# Patient Record
Sex: Female | Born: 1975 | Race: White | Hispanic: No | Smoking: Former smoker
Health system: Southern US, Community
[De-identification: ages and names within clinical notes are randomized; demographics above are authoritative.]

## PROBLEM LIST (undated history)

## (undated) DIAGNOSIS — F419 Anxiety disorder, unspecified: Secondary | ICD-10-CM

## (undated) DIAGNOSIS — L219 Seborrheic dermatitis, unspecified: Secondary | ICD-10-CM

## (undated) DIAGNOSIS — R51 Headache: Secondary | ICD-10-CM

## (undated) DIAGNOSIS — E8881 Metabolic syndrome: Secondary | ICD-10-CM

## (undated) DIAGNOSIS — E669 Obesity, unspecified: Secondary | ICD-10-CM

## (undated) DIAGNOSIS — R519 Headache, unspecified: Secondary | ICD-10-CM

## (undated) DIAGNOSIS — G43909 Migraine, unspecified, not intractable, without status migrainosus: Secondary | ICD-10-CM

## (undated) DIAGNOSIS — G43011 Migraine without aura, intractable, with status migrainosus: Secondary | ICD-10-CM

## (undated) HISTORY — DX: Headache: R51

## (undated) HISTORY — DX: Metabolic syndrome: E88.81

## (undated) HISTORY — DX: Migraine, unspecified, not intractable, without status migrainosus: G43.909

## (undated) HISTORY — DX: Seborrheic dermatitis, unspecified: L21.9

## (undated) HISTORY — DX: Obesity, unspecified: E66.9

## (undated) HISTORY — DX: Metabolic syndrome: E88.810

## (undated) HISTORY — DX: Anxiety disorder, unspecified: F41.9

## (undated) HISTORY — PX: CHOLECYSTECTOMY: SHX55

## (undated) HISTORY — DX: Headache, unspecified: R51.9

## (undated) HISTORY — PX: TUMOR REMOVAL: SHX12

## (undated) HISTORY — DX: Migraine without aura, intractable, with status migrainosus: G43.011

---

## 2000-11-17 HISTORY — PX: TUBAL LIGATION: SHX77

## 2006-11-18 ENCOUNTER — Ambulatory Visit: Payer: Self-pay | Admitting: Family Medicine

## 2006-12-08 ENCOUNTER — Ambulatory Visit: Payer: Self-pay | Admitting: General Surgery

## 2008-01-25 ENCOUNTER — Ambulatory Visit: Payer: Self-pay | Admitting: General Surgery

## 2008-01-31 ENCOUNTER — Ambulatory Visit: Payer: Self-pay | Admitting: General Surgery

## 2008-11-16 ENCOUNTER — Ambulatory Visit: Payer: Self-pay | Admitting: Family Medicine

## 2014-04-03 ENCOUNTER — Ambulatory Visit (INDEPENDENT_AMBULATORY_CARE_PROVIDER_SITE_OTHER): Payer: BC Managed Care – HMO | Admitting: General Surgery

## 2014-04-03 ENCOUNTER — Encounter: Payer: Self-pay | Admitting: General Surgery

## 2014-04-03 VITALS — BP 124/76 | HR 74 | Resp 12 | Ht 64.0 in | Wt 246.0 lb

## 2014-04-03 DIAGNOSIS — R229 Localized swelling, mass and lump, unspecified: Secondary | ICD-10-CM

## 2014-04-03 NOTE — Progress Notes (Signed)
Patient ID: Sally Mueller, female   DOB: 06-24-1976, 38 y.o.   MRN: [id number]  Chief Complaint  Patient presents with  . Other    New Patient evaluation lipoma on left arm    HPI Sally Mueller is a 38 y.o. female who presents for an evaluation of a lipoma on her left arm. The patient states the area has been present for approximately 4-5 years. She states is has gotten larger gradually over time. She has pain in the area when it is bumped or touched. No other problems at this time.   HPI  Past Medical History  Diagnosis Date  . Migraine     Past Surgical History  Procedure Laterality Date  . Cholecystectomy  2008?  [name] Cesarean section  2000, 2002  . Tumor removal  2012?    History reviewed. No pertinent family history.  Social History History  Substance Use Topics  . Smoking status: Former Smoker -- 8 years  . Smokeless tobacco: Never Used  . Alcohol Use: Yes    No Known Allergies  Current Outpatient Prescriptions  Medication Sig Dispense Refill  . ibuprofen (ADVIL,MOTRIN) 800 MG tablet Take 1 tablet by mouth as needed.       No current facility-administered medications for this visit.    Review of Systems Review of Systems  Constitutional: Negative.   Respiratory: Negative.   Cardiovascular: Negative.     Blood pressure 124/76, pulse 74, resp. rate 12, height 5\' 4"  (1.626 m), weight 246 lb (111.585 kg), last menstrual period 03/27/2014.  Physical Exam Physical Exam  Constitutional: She is oriented to person, place, and time. She appears well-developed and well-nourished.  Neurological: She is alert and oriented to person, place, and time.  Skin: Skin is warm and dry.  Firm nodule on left forearm medial aspect midpoint. 2.5 x 2 cm slightly firm mildly tender subcutaneous mass. Mobile.    Data Reviewed    Assessment    Lipoma left forearm, symptomatic     Plan    Excision in office. Disussed fully with pt anf she is agreeable.        Seeplaputhur G  Sankar 04/03/2014, 2:41 PM

## 2014-04-03 NOTE — Patient Instructions (Signed)
Patient to return to have excision for left forearm mass removed. The patient is aware to call back for any questions or concerns.

## 2014-05-01 ENCOUNTER — Other Ambulatory Visit: Payer: Self-pay | Admitting: General Surgery

## 2014-05-01 ENCOUNTER — Encounter: Payer: Self-pay | Admitting: General Surgery

## 2014-05-01 ENCOUNTER — Ambulatory Visit (INDEPENDENT_AMBULATORY_CARE_PROVIDER_SITE_OTHER): Payer: BC Managed Care – HMO | Admitting: General Surgery

## 2014-05-01 VITALS — BP 116/74 | HR 76 | Resp 12 | Ht 64.0 in | Wt 247.0 lb

## 2014-05-01 DIAGNOSIS — D2360 Other benign neoplasm of skin of unspecified upper limb, including shoulder: Secondary | ICD-10-CM

## 2014-05-01 DIAGNOSIS — R2230 Localized swelling, mass and lump, unspecified upper limb: Secondary | ICD-10-CM

## 2014-05-01 HISTORY — PX: LIPOMA EXCISION: SHX5283

## 2014-05-01 NOTE — Progress Notes (Signed)
Patient ID: Sally Mueller, female   DOB: [birth year], 38 y.o.   MRN: [id number]  Chief Complaint  Patient presents with  . Procedure    left arm lipoma    HPI Sally Mueller is a 38 y.o. female here today for an excision left forearm  lipoma. HPI  Past Medical History  Diagnosis Date  . Migraine     Past Surgical History  Procedure Laterality Date  . Cholecystectomy  2008?  Sally Mueller Cesarean section  2000, 2002  . Tumor removal  2012?    History reviewed. No pertinent family history.  Social History History  Substance Use Topics  . Smoking status: Former Smoker -- 8 years  . Smokeless tobacco: Never Used  . Alcohol Use: Yes    No Known Allergies  Current Outpatient Prescriptions  Medication Sig Dispense Refill  . ibuprofen (ADVIL,MOTRIN) 800 MG tablet Take 1 tablet by mouth as needed.      Sally Mueller ketorolac (TORADOL) 10 MG tablet Take 10 mg by mouth every 6 (six) hours as needed.       No current facility-administered medications for this visit.    Review of Systems Review of Systems  Constitutional: Negative.   Respiratory: Negative.   Cardiovascular: Negative.     Blood pressure 116/74, pulse 76, resp. rate 12, height 5\' 4"  (1.626 m), weight 247 lb (112.038 kg), last menstrual period 03/27/2014.  Physical Exam Physical Exam  Data Reviewed    Assessment    Lipoma left forearm     Plan    Excision dons as scheduled.      Procedure; Excision subcutaneous mass left forearm.  Anesthetic: 8 ml 1% xylocaine mixed with 0.5% marcaine.  Area prepped with Chloro Prep and sraped. Vertical 2cm incision made. 2.5 cm lipomatous mass excised out. Bleeding controlled with disposable cautery. Deeper tissue closed with 3-0 vicryl. Skin closed with 4-0 vicryl subcuticular stitch. Steristrips, telfa and Tegaderm applied. No immediate problems with procedure.  [facility] yo use Tylenol, motrin or Aleve as needed for pain.    Naw Sally Mueller 05/01/2014, 9:00 PM

## 2014-05-01 NOTE — Patient Instructions (Addendum)
Patient to return as needed. The patient is aware to call back for any questions or concerns. Wound care instructions given.

## 2014-05-03 LAB — PATHOLOGY

## 2014-05-09 ENCOUNTER — Telehealth: Payer: Self-pay | Admitting: *Deleted

## 2014-05-09 NOTE — Telephone Encounter (Signed)
Notified patient as instructed, patient pleased. Discussed follow-up appointments on an as needed basis, patient agrees

## 2014-05-09 NOTE — Telephone Encounter (Signed)
Message copied by Carson Myrtle on Tue May 09, 2014  8:10 AM ------      Message from: Christene Lye      Created: Fri May 05, 2014 12:08 PM       Please let pt pt know the pathology was normal. ------

## 2014-09-18 ENCOUNTER — Encounter: Payer: Self-pay | Admitting: General Surgery

## 2015-05-12 ENCOUNTER — Other Ambulatory Visit: Payer: Self-pay | Admitting: Family Medicine

## 2015-05-14 NOTE — Telephone Encounter (Signed)
She must be seen, printed one month

## 2015-05-17 ENCOUNTER — Ambulatory Visit (INDEPENDENT_AMBULATORY_CARE_PROVIDER_SITE_OTHER): Payer: 59 | Admitting: Family Medicine

## 2015-05-17 ENCOUNTER — Encounter: Payer: Self-pay | Admitting: Family Medicine

## 2015-05-17 VITALS — BP 128/74 | HR 74 | Temp 97.7°F | Resp 18 | Ht 64.0 in | Wt 246.6 lb

## 2015-05-17 DIAGNOSIS — R519 Headache, unspecified: Secondary | ICD-10-CM | POA: Insufficient documentation

## 2015-05-17 DIAGNOSIS — E8881 Metabolic syndrome: Secondary | ICD-10-CM | POA: Insufficient documentation

## 2015-05-17 DIAGNOSIS — N946 Dysmenorrhea, unspecified: Secondary | ICD-10-CM | POA: Insufficient documentation

## 2015-05-17 DIAGNOSIS — G43009 Migraine without aura, not intractable, without status migrainosus: Secondary | ICD-10-CM

## 2015-05-17 DIAGNOSIS — F411 Generalized anxiety disorder: Secondary | ICD-10-CM | POA: Diagnosis not present

## 2015-05-17 DIAGNOSIS — G43109 Migraine with aura, not intractable, without status migrainosus: Secondary | ICD-10-CM | POA: Insufficient documentation

## 2015-05-17 DIAGNOSIS — R51 Headache: Secondary | ICD-10-CM

## 2015-05-17 DIAGNOSIS — D1720 Benign lipomatous neoplasm of skin and subcutaneous tissue of unspecified limb: Secondary | ICD-10-CM | POA: Insufficient documentation

## 2015-05-17 MED ORDER — ALPRAZOLAM ER 0.5 MG PO TB24: 0.5000 mg | ORAL_TABLET | Freq: Every day | ORAL | Status: DC

## 2015-05-17 NOTE — Progress Notes (Signed)
Name: Sally Mueller   MRN: [id number]    DOB: 1976-02-03   Date:05/17/2015       Progress Note  Subjective  Chief Complaint  Chief Complaint  Patient presents with  . Migraine    Started yesterday at 6 a.m. started in left eye, worsen around menstrual cycle and anxiety, radiating to left back of head, sharp and stabbing pain, throbbing on right side of eye. Patient has got a daith piercing on left side and helps intensity  . Anxiety    feels like medication is not as potency as it was.    HPI  Migraine headaches: she has noticed decrease in frequency of migraine since March 2016, but she had a severe episode yesterday and was feeling nauseated until she got here today. She denies aura, pain is always behind the left eye, described as a sharp/piercing pain, associated with nausea, phonophobia, photophobia, dizziness. She has been holding off on taking Axert , takes Tramadol first and promethazine and waiting to take Axert if gets worse.   Generalized Anxiety Disorder: taking Summer Classes at [employer] and getting licence in education. She has been having to take alprazolam twice at times.   Patient Active Problem List   Diagnosis Date Noted  . Anxiety, generalized 05/17/2015  . Tension headache 05/17/2015  . Dysmenorrhea 05/17/2015  . Dysmetabolic syndrome 84/69/6295  . Migraine without status migrainosus, not intractable 05/17/2015  . Extreme obesity 05/17/2015    Past Surgical History  Procedure Laterality Date  . Cholecystectomy  2008?  [name] Cesarean section  2000, 2002  . Tumor removal  2012?  [name] Tubal ligation  2002  . Lipoma excision  05/01/2014    Family History  Problem Relation Age of Onset  . Myasthenia gravis Sister   . Hyperlipidemia Father   . Cardiomyopathy Mother   . Hypertension Mother     History   Social History  . Marital Status: Single    Spouse Name: N/A  . Number of Children: 3  . Years of Education: N/A   Occupational History  .  Not on file.   Social History Main Topics  . Smoking status: Former Smoker -- 8 years    Types: Cigarettes    Quit date: 11/17/2001  . Smokeless tobacco: Never Used  . Alcohol Use: 0.0 oz/week    0 Standard drinks or equivalent per week     Comment: occasionally-socially  . Drug Use: No  . Sexual Activity:    Partners: Male   Other Topics Concern  . Not on file   Social History Narrative     Current outpatient prescriptions:  .  almotriptan (AXERT) 12.5 MG tablet, Take 1 tablet by mouth as needed., Disp: , Rfl:  .  ALPRAZolam (XANAX) 0.5 MG tablet, TAKE 1 TABLET BY MOUTH AS NEEDED FOR ANXIETY ATTACK OR SLEEP.(MAX OF ONE TABLET A DAY), Disp: 30 tablet, Rfl: 0 .  ibuprofen (ADVIL,MOTRIN) 800 MG tablet, Take 1 tablet by mouth as needed., Disp: , Rfl:  .  promethazine (PHENERGAN) 12.5 MG tablet, Take 1 tablet by mouth every 4 (four) hours as needed., Disp: , Rfl:  .  traMADol (ULTRAM) 50 MG tablet, TAKE 1 TABLET BY MOUTH AT ONSET OF MIGRAINE AS NEEDED., Disp: 30 tablet, Rfl: 0 .  ALPRAZolam (ALPRAZOLAM XR) 0.5 MG 24 hr tablet, Take 1 tablet (0.5 mg total) by mouth daily., Disp: 30 tablet, Rfl: 0 .  ketorolac (TORADOL) 10 MG tablet, Take 10 mg by mouth every  6 (six) hours as needed., Disp: , Rfl:   Allergies  Allergen Reactions  . Sumatriptan     crawling sensation in her body, flushed     ROS  Constitutional: Negative for fever or weight change.  Respiratory: Negative for cough and shortness of breath.   Cardiovascular: Negative for chest pain or palpitations.  Gastrointestinal: Negative for abdominal pain, no bowel changes.  Musculoskeletal: Negative for gait problem or joint swelling.  Skin: Negative for rash.  Neurological: positive  for dizziness or headache.  No other specific complaints in a complete review of systems (except as listed in HPI above).   Objective  Filed Vitals:   05/17/15 1439  BP: 128/74  Pulse: 74  Temp: 97.7 F (36.5 C)  TempSrc: Oral   Resp: 18  Height: 5\' 4"  (1.626 m)  Weight: 246 lb 9.6 oz (111.857 kg)  SpO2: 96%    Body mass index is 42.31 kg/(m^2).  Physical Exam   Constitutional: Patient appears well-developed and well-nourished. No distress.  Eyes:  No scleral icterus. PERL Neck: Normal range of motion. Neck supple. Cardiovascular: Normal rate, regular rhythm and normal heart sounds.  No murmur heard. No BLE edema. Pulmonary/Chest: Effort normal and breath sounds normal. No respiratory distress. Abdominal: Soft.  There is no tenderness. Psychiatric: Patient has a normal mood and affect. behavior is normal. Judgment and thought content normal. Neuro: Romberg negative, normal coordination  PHQ2/9: Depression screen PHQ 2/9 05/17/2015  Decreased Interest 0  Down, Depressed, Hopeless 0  PHQ - 2 Score 0     Fall Risk: Fall Risk  05/17/2015  Falls in the past year? No    Assessment & Plan  1. Migraine without aura and without status migrainosus, not intractable Explained importance of taking Axert right away and other medications prn  2. Anxiety, generalized  - ALPRAZolam (ALPRAZOLAM XR) 0.5 MG 24 hr tablet; Take 1 tablet (0.5 mg total) by mouth daily.  Dispense: 30 tablet; Refill: 0

## 2015-05-17 NOTE — Patient Instructions (Signed)

## 2015-06-21 ENCOUNTER — Other Ambulatory Visit: Payer: Self-pay

## 2015-06-21 ENCOUNTER — Telehealth: Payer: Self-pay | Admitting: Family Medicine

## 2015-06-21 NOTE — Telephone Encounter (Signed)
Requesting refill on tramadol. Please send to [email or web address]

## 2015-06-22 MED ORDER — TRAMADOL HCL 50 MG PO TABS
50.0000 mg | ORAL_TABLET | Freq: Four times a day (QID) | ORAL | Status: DC
Start: 2015-06-22 — End: 2015-10-18

## 2015-06-26 ENCOUNTER — Other Ambulatory Visit: Payer: Self-pay

## 2015-06-26 ENCOUNTER — Telehealth: Payer: Self-pay | Admitting: Family Medicine

## 2015-06-26 NOTE — Telephone Encounter (Signed)
Pt states she needs refill on Tramodol. She is going out of town tomorrow and has an appt on 07/12/15.

## 2015-06-26 NOTE — Telephone Encounter (Signed)
Already called into [facility]

## 2015-06-28 ENCOUNTER — Ambulatory Visit: Payer: 59 | Admitting: Family Medicine

## 2015-07-12 ENCOUNTER — Encounter: Payer: Self-pay | Admitting: Family Medicine

## 2015-07-12 ENCOUNTER — Ambulatory Visit (INDEPENDENT_AMBULATORY_CARE_PROVIDER_SITE_OTHER): Payer: 59 | Admitting: Family Medicine

## 2015-07-12 VITALS — BP 126/78 | HR 89 | Temp 98.4°F | Resp 18 | Ht 64.0 in | Wt 244.0 lb

## 2015-07-12 DIAGNOSIS — Z79899 Other long term (current) drug therapy: Secondary | ICD-10-CM

## 2015-07-12 DIAGNOSIS — Z13220 Encounter for screening for lipoid disorders: Secondary | ICD-10-CM

## 2015-07-12 DIAGNOSIS — E8881 Metabolic syndrome: Secondary | ICD-10-CM

## 2015-07-12 DIAGNOSIS — G44209 Tension-type headache, unspecified, not intractable: Secondary | ICD-10-CM | POA: Diagnosis not present

## 2015-07-12 DIAGNOSIS — G43009 Migraine without aura, not intractable, without status migrainosus: Secondary | ICD-10-CM | POA: Diagnosis not present

## 2015-07-12 DIAGNOSIS — F411 Generalized anxiety disorder: Secondary | ICD-10-CM | POA: Diagnosis not present

## 2015-07-12 DIAGNOSIS — K589 Irritable bowel syndrome without diarrhea: Secondary | ICD-10-CM | POA: Insufficient documentation

## 2015-07-12 MED ORDER — HYOSCYAMINE SULFATE 0.125 MG PO TABS: 0.1250 mg | ORAL_TABLET | ORAL | Status: DC | PRN

## 2015-07-12 MED ORDER — RIZATRIPTAN BENZOATE 10 MG PO TABS: 10.0000 mg | ORAL_TABLET | ORAL | Status: DC | PRN

## 2015-07-12 MED ORDER — ALPRAZOLAM 0.5 MG PO TABS: 0.5000 mg | ORAL_TABLET | Freq: Every day | ORAL | Status: DC | PRN

## 2015-07-12 MED ORDER — NORTRIPTYLINE HCL 10 MG PO CAPS: 10.0000 mg | ORAL_CAPSULE | Freq: Every day | ORAL | Status: DC

## 2015-07-12 MED ORDER — ALPRAZOLAM ER 0.5 MG PO TB24: 0.5000 mg | ORAL_TABLET | Freq: Every day | ORAL | Status: DC

## 2015-07-12 NOTE — Progress Notes (Signed)
Name: Sally Mueller   MRN: [id number]    DOB: 29-May-1976   Date:07/12/2015       Progress Note  Subjective  Chief Complaint  Chief Complaint  Patient presents with  . Medication Refill    1 month F/U  . Anxiety    Unchanged  . Migraine    Patient has not had a migraine since a month ago, and takes Tramadol for Migraine preventation    HPI  IBS: for the past 5 years she has noticed, bloating, abdominal pain, episodes of constipation and diarrhea, multiple trips to the bathroom in a day that sometimes affects her job. Pain improves after having a bowel movement.   Migraine: she does not have aura, episodes are not as frequent now, but she takes Tramadol to prevent a migraine , takes for tension type a few times weekly. Insurance no longer covers Axert and needs to try Maxalt  Anxiety: she is doing well on medication, Xanax SR is working well for her, taking prn medication very seldom for panic attack only   Patient Active Problem List   Diagnosis Date Noted  . IBS (irritable bowel syndrome) 07/12/2015  . Anxiety, generalized 05/17/2015  . Tension headache 05/17/2015  . Dysmenorrhea 05/17/2015  . Dysmetabolic syndrome 71/24/5809  . Migraine without status migrainosus, not intractable 05/17/2015  . Extreme obesity 05/17/2015    Past Surgical History  Procedure Laterality Date  . Cholecystectomy  2008?  [name] Cesarean section  2000, 2002  . Tumor removal  2012?  [name] Tubal ligation  2002  . Lipoma excision  05/01/2014    Family History  Problem Relation Age of Onset  . Myasthenia gravis Sister   . Hyperlipidemia Father   . Cardiomyopathy Mother   . Hypertension Mother     Social History   Social History  . Marital Status: Single    Spouse Name: N/A  . Number of Children: 3  . Years of Education: N/A   Occupational History  . Not on file.   Social History Main Topics  . Smoking status: Former Smoker -- 8 years    Types: Cigarettes    Quit date: 11/17/2001  .  Smokeless tobacco: Never Used  . Alcohol Use: 0.0 oz/week    0 Standard drinks or equivalent per week     Comment: occasionally-socially  . Drug Use: No  . Sexual Activity:    Partners: Male   Other Topics Concern  . Not on file   Social History Narrative     Current outpatient prescriptions:  .  ALPRAZolam (ALPRAZOLAM XR) 0.5 MG 24 hr tablet, Take 1 tablet (0.5 mg total) by mouth daily., Disp: 30 tablet, Rfl: 2 .  ALPRAZolam (XANAX) 0.5 MG tablet, Take 1 tablet (0.5 mg total) by mouth daily as needed for anxiety., Disp: 30 tablet, Rfl: 0 .  ibuprofen (ADVIL,MOTRIN) 800 MG tablet, Take 1 tablet by mouth as needed., Disp: , Rfl:  .  promethazine (PHENERGAN) 12.5 MG tablet, Take 1 tablet by mouth every 4 (four) hours as needed., Disp: , Rfl:  .  traMADol (ULTRAM) 50 MG tablet, Take 1 tablet (50 mg total) by mouth 4 (four) times daily., Disp: 30 tablet, Rfl: 0 .  hyoscyamine (LEVSIN) 0.125 MG tablet, Take 1 tablet (0.125 mg total) by mouth every 4 (four) hours as needed., Disp: 90 tablet, Rfl: 2 .  nortriptyline (PAMELOR) 10 MG capsule, Take 1 capsule (10 mg total) by mouth at bedtime., Disp: 30 capsule, Rfl: 2 .  rizatriptan (MAXALT) 10 MG tablet, Take 1 tablet (10 mg total) by mouth as needed for migraine. May repeat in 2 hours if needed, Disp: 4 tablet, Rfl: 2  Allergies  Allergen Reactions  . Sumatriptan     crawling sensation in her body, flushed     ROS  Constitutional: Negative for fever or weight change.  Respiratory: Negative for cough and shortness of breath.   Cardiovascular: Negative for chest pain or palpitations.  Gastrointestinal: Positive  for abdominal pain, no bowel changes - chronic problems Musculoskeletal: Negative for gait problem or joint swelling.  Skin: Negative for rash.  Neurological: Negative for dizziness , positive for  headache.  No other specific complaints in a complete review of systems (except as listed in HPI above).  Objective  Filed  Vitals:   07/12/15 1111  BP: 126/78  Pulse: 89  Temp: 98.4 F (36.9 C)  TempSrc: Oral  Resp: 18  Height: 5\' 4"  (1.626 m)  Weight: 244 lb (110.678 kg)  SpO2: 97%    Body mass index is 41.86 kg/(m^2).  Physical Exam  Constitutional: Patient appears well-developed and well-nourished. Obese  No distress.  HEENT: head atraumatic, normocephalic, pupils equal and reactive to light, neck supple, throat within normal limits Cardiovascular: Normal rate, regular rhythm and normal heart sounds.  No murmur heard. No BLE edema. Pulmonary/Chest: Effort normal and breath sounds normal. No respiratory distress. Abdominal: Soft.  Mild  tenderness. Psychiatric: Patient has a normal mood and affect. behavior is normal. Judgment and thought content normal. Neurological: normal   PHQ2/9: Depression screen PHQ 2/9 05/17/2015  Decreased Interest 0  Down, Depressed, Hopeless 0  PHQ - 2 Score 0    Fall Risk: Fall Risk  05/17/2015  Falls in the past year? No     Assessment & Plan   1. Migraine without aura and without status migrainosus, not intractable  - rizatriptan (MAXALT) 10 MG tablet; Take 1 tablet (10 mg total) by mouth as needed for migraine. May repeat in 2 hours if needed  Dispense: 4 tablet; Refill: 2  2. Dysmetabolic syndrome  - Hemoglobin A1c  3. Anxiety, generalized  - ALPRAZolam (ALPRAZOLAM XR) 0.5 MG 24 hr tablet; Take 1 tablet (0.5 mg total) by mouth daily.  Dispense: 30 tablet; Refill: 2 - ALPRAZolam (XANAX) 0.5 MG tablet; Take 1 tablet (0.5 mg total) by mouth daily as needed for anxiety.  Dispense: 30 tablet; Refill: 0  4. Tension headache Continue Tramadol prn   5. IBS (irritable bowel syndrome)  - nortriptyline (PAMELOR) 10 MG capsule; Take 1 capsule (10 mg total) by mouth at bedtime.  Dispense: 30 capsule; Refill: 2 - hyoscyamine (LEVSIN) 0.125 MG tablet; Take 1 tablet (0.125 mg total) by mouth every 4 (four) hours as needed.  Dispense: 90 tablet; Refill: 2  6.  Lipid screening  - Lipid panel  7. Long-term use of high-risk medication  - Comprehensive metabolic panel

## 2015-07-12 NOTE — Patient Instructions (Signed)
Irritable Bowel Syndrome Irritable bowel syndrome (IBS) is caused by a disturbance of normal bowel function and is a common digestive disorder. You may also hear this condition called spastic colon, mucous colitis, and irritable colon. There is no cure for IBS. However, symptoms often gradually improve or disappear with a good diet, stress management, and medicine. This condition usually appears in late adolescence or early adulthood. Women develop it twice as often as men. CAUSES  After food has been digested and absorbed in the small intestine, waste material is moved into the large intestine, or colon. In the colon, water and salts are absorbed from the undigested products coming from the small intestine. The remaining residue, or fecal material, is held for elimination. Under normal circumstances, gentle, rhythmic contractions of the bowel walls push the fecal material along the colon toward the rectum. In IBS, however, these contractions are irregular and poorly coordinated. The fecal material is either retained too long, resulting in constipation, or expelled too soon, producing diarrhea. SIGNS AND SYMPTOMS  The most common symptom of IBS is abdominal pain. It is often in the lower left side of the abdomen, but it may occur anywhere in the abdomen. The pain comes from spasms of the bowel muscles happening too much and from the buildup of gas and fecal material in the colon. This pain:  Can range from sharp abdominal cramps to a dull, continuous ache.  Often worsens soon after eating.  Is often relieved by having a bowel movement or passing gas. Abdominal pain is usually accompanied by constipation, but it may also produce diarrhea. The diarrhea often occurs right after a meal or upon waking up in the morning. The stools are often soft, watery, and flecked with mucus. Other symptoms of IBS include:  Bloating.  Loss of appetite.  Heartburn.  Backache.  Dull pain in the arms or  shoulders.  Nausea.  Burping.  Vomiting.  Gas. IBS may also cause symptoms that are unrelated to the digestive system, such as:  Fatigue.  Headaches.  Anxiety.  Shortness of breath.  Trouble concentrating.  Dizziness. These symptoms tend to come and go. DIAGNOSIS  The symptoms of IBS may seem like symptoms of other, more serious digestive disorders. Your health care Sally Mueller may want to perform tests to exclude these disorders.  TREATMENT Many medicines are available to help correct bowel function or relieve bowel spasms and abdominal pain. Among the medicines available are:  Laxatives for severe constipation and to help restore normal bowel habits.  Specific antidiarrheal medicines to treat severe or lasting diarrhea.  Antispasmodic agents to relieve intestinal cramps. Your health care Sally Mueller may also decide to treat you with a mild tranquilizer or sedative during unusually stressful periods in your life. Your health care Sally Mueller may also prescribe antidepressant medicine. The use of this medicine has been shown to reduce pain and other symptoms of IBS. Remember that if any medicine is prescribed for you, you should take it exactly as directed. Make sure your health care Sally Mueller knows how well it worked for you. HOME CARE INSTRUCTIONS   Take all medicines as directed by your health care Sally Mueller.  Avoid foods that are high in fat or oils, such as heavy cream, butter, frankfurters, sausage, and other fatty meats.  Avoid foods that make you go to the bathroom, such as fruit, fruit juice, and dairy products.  Cut out carbonated drinks, chewing gum, and "gassy" foods such as beans and cabbage. This may help relieve bloating and burping.    Eat foods with bran, and drink plenty of liquids with the bran foods. This helps relieve constipation.  Keep track of what foods seem to bring on your symptoms.  Avoid emotionally charged situations or circumstances that produce  anxiety.  Start or continue exercising.  Get plenty of rest and sleep. Document Released: 11/03/2005 Document Revised: 11/08/2013 Document Reviewed: 06/23/2008 [facility] Patient Information 2015 [place], [place]. This information is not intended to replace advice given to you by your health care Sally Mueller. Make sure you discuss any questions you have with your health care Sally Mueller. Diet and Irritable Bowel Syndrome  No cure has been found for irritable bowel syndrome (IBS). Many options are available to treat the symptoms. Your caregiver will give you the best treatments available for your symptoms. He or she will also encourage you to manage stress and to make changes to your diet. You need to work with your caregiver and Registered Dietician to find the best combination of medicine, diet, counseling, and support to control your symptoms. The following are some diet suggestions. FOODS THAT MAKE IBS WORSE  Fatty foods, such as [place] fries.  Milk products, such as cheese or ice cream.  Chocolate.  Alcohol.  Caffeine (found in coffee and some sodas).  Carbonated drinks, such as soda. If certain foods cause symptoms, you should eat less of them or stop eating them. FOOD JOURNAL   Keep a journal of the foods that seem to cause distress. Write down:  What you are eating during the day and when.  What problems you are having after eating.  When the symptoms occur in relation to your meals.  What foods always make you feel badly.  Take your notes with you to your caregiver to see if you should stop eating certain foods. FOODS THAT MAKE IBS BETTER Fiber reduces IBS symptoms, especially constipation, because it makes stools soft, bulky, and easier to pass. Fiber is found in bran, bread, cereal, beans, fruit, and vegetables. Examples of foods with fiber include:  Apples.  Peaches.  Pears.  Berries.  Figs.  Broccoli, raw.  Cabbage.  Carrots.  Raw peas.  Kidney  beans.  Lima beans.  Whole-grain bread.  Whole-grain cereal. Add foods with fiber to your diet a little at a time. This will let your body get used to them. Too much fiber at once might cause gas and swelling of your abdomen. This can trigger symptoms in a person with IBS. Caregivers usually recommend a diet with enough fiber to produce soft, painless bowel movements. High fiber diets may cause gas and bloating. However, these symptoms often go away within a few weeks, as your body adjusts. In many cases, dietary fiber may lessen IBS symptoms, particularly constipation. However, it may not help pain or diarrhea. High fiber diets keep the colon mildly enlarged (distended) with the added fiber. This may help prevent spasms in the colon. Some forms of fiber also keep water in the stool, thereby preventing hard stools that are difficult to pass.  Besides telling you to eat more foods with fiber, your caregiver may also tell you to get more fiber by taking a fiber pill or drinking water mixed with a special high fiber powder. An example of this is a natural fiber laxative containing psyllium seed.  TIPS  Large meals can cause cramping and diarrhea in people with IBS. If this happens to you, try eating 4 or 5 small meals a day, or try eating less at each of your usual 3 meals.  It may also help if your meals are low in fat and high in carbohydrates. Examples of carbohydrates are pasta, rice, whole-grain breads and cereals, fruits, and vegetables.  If dairy products cause your symptoms to flare up, you can try eating less of those foods. You might be able to handle yogurt better than other dairy products, because it contains bacteria that helps with digestion. Dairy products are an important source of calcium and other nutrients. If you need to avoid dairy products, be sure to talk with a Registered Dietitian about getting these nutrients through other food sources.  Drink enough water and fluids to keep  your urine clear or pale yellow. This is important, especially if you have diarrhea. FOR MORE INFORMATION  International Foundation for Functional Gastrointestinal Disorders: www.iffgd.org  National Digestive Diseases Information Clearinghouse: digestive.AmenCredit.is Document Released: 01/24/2004 Document Revised: 01/26/2012 Document Reviewed: 02/03/2014 [facility] Patient Information 2015 [place], [place]. This information is not intended to replace advice given to you by your health care Sally Mueller. Make sure you discuss any questions you have with your health care Sally Mueller.

## 2015-07-13 ENCOUNTER — Ambulatory Visit: Payer: 59 | Admitting: Family Medicine

## 2015-08-30 ENCOUNTER — Ambulatory Visit
Admission: RE | Admit: 2015-08-30 | Discharge: 2015-08-30 | Disposition: A | Discharge status: Home / Self Care | Payer: 59 | Source: Ambulatory Visit | Attending: Family Medicine | Admitting: Family Medicine

## 2015-08-30 ENCOUNTER — Ambulatory Visit (INDEPENDENT_AMBULATORY_CARE_PROVIDER_SITE_OTHER): Payer: 59 | Admitting: Family Medicine

## 2015-08-30 ENCOUNTER — Encounter: Payer: Self-pay | Admitting: Family Medicine

## 2015-08-30 DIAGNOSIS — M542 Cervicalgia: Secondary | ICD-10-CM | POA: Diagnosis not present

## 2015-08-30 DIAGNOSIS — M62838 Other muscle spasm: Secondary | ICD-10-CM | POA: Insufficient documentation

## 2015-08-30 DIAGNOSIS — G43019 Migraine without aura, intractable, without status migrainosus: Secondary | ICD-10-CM | POA: Diagnosis not present

## 2015-08-30 IMAGING — CR DG CERVICAL SPINE COMPLETE 4+V
5 series · 5 of 5 positions shown · non-contrast
Comparison: None.

CLINICAL DATA: Posterior neck pain for several days, no injury

EXAM:
CERVICAL SPINE  4+ VIEWS

[c-spine lat]
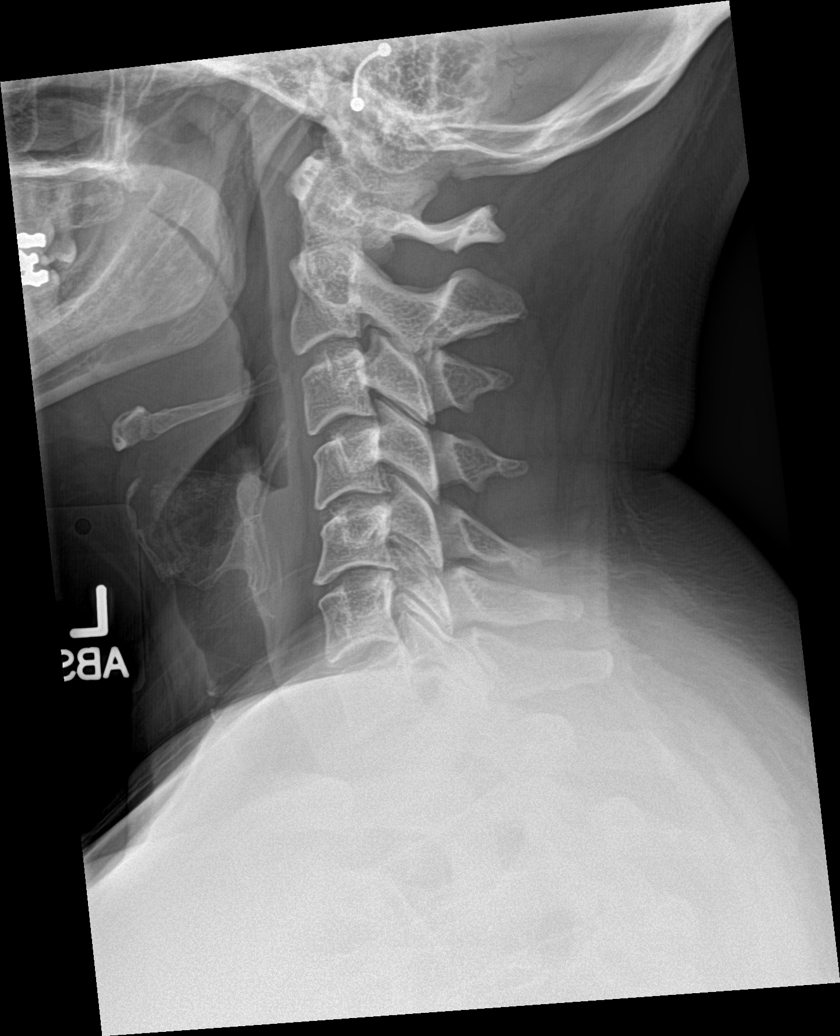

[c-spine obl (1 of 2)]
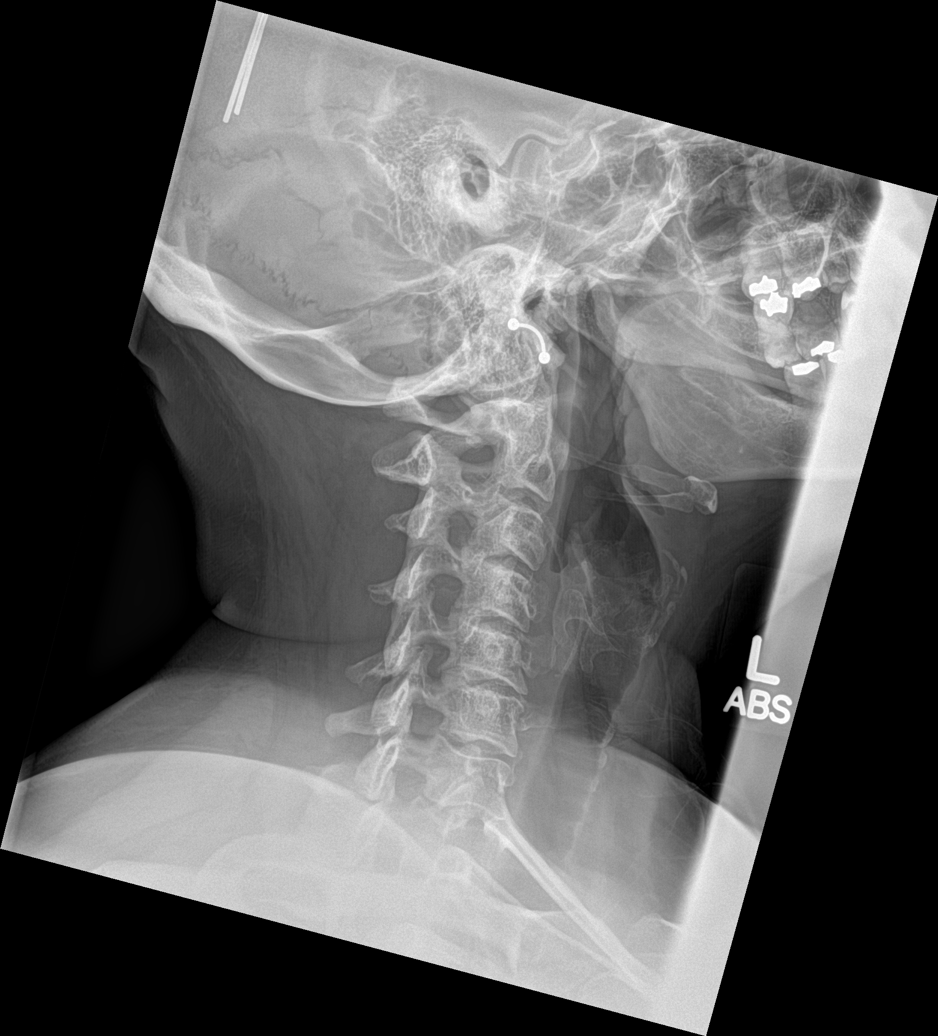

[c-spine obl (2 of 2)]
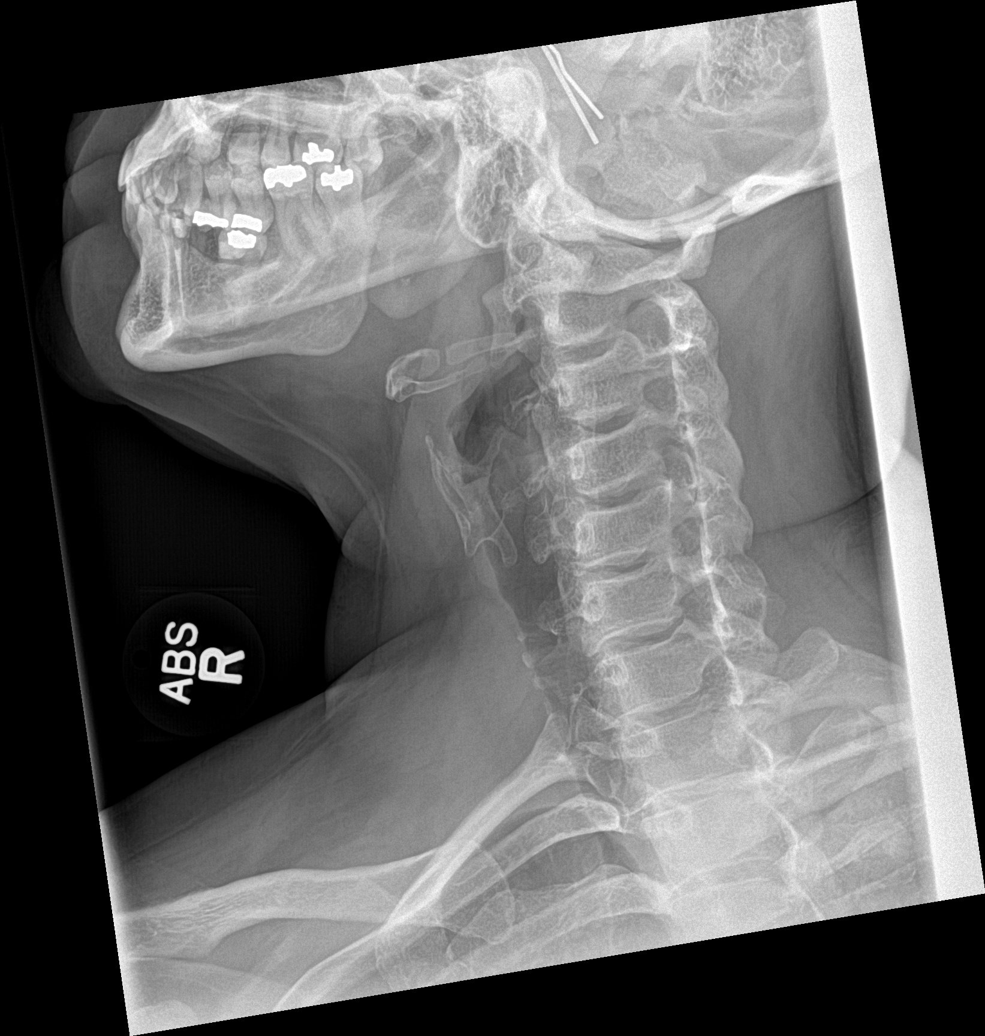

[c-spine ap]
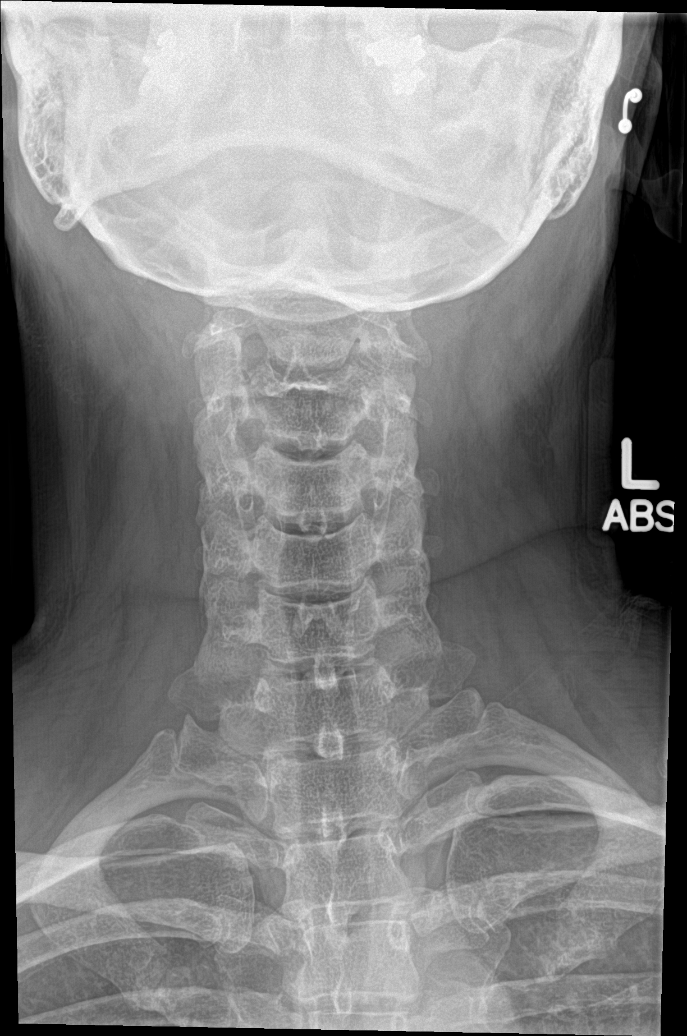

[c-spine open mouth]
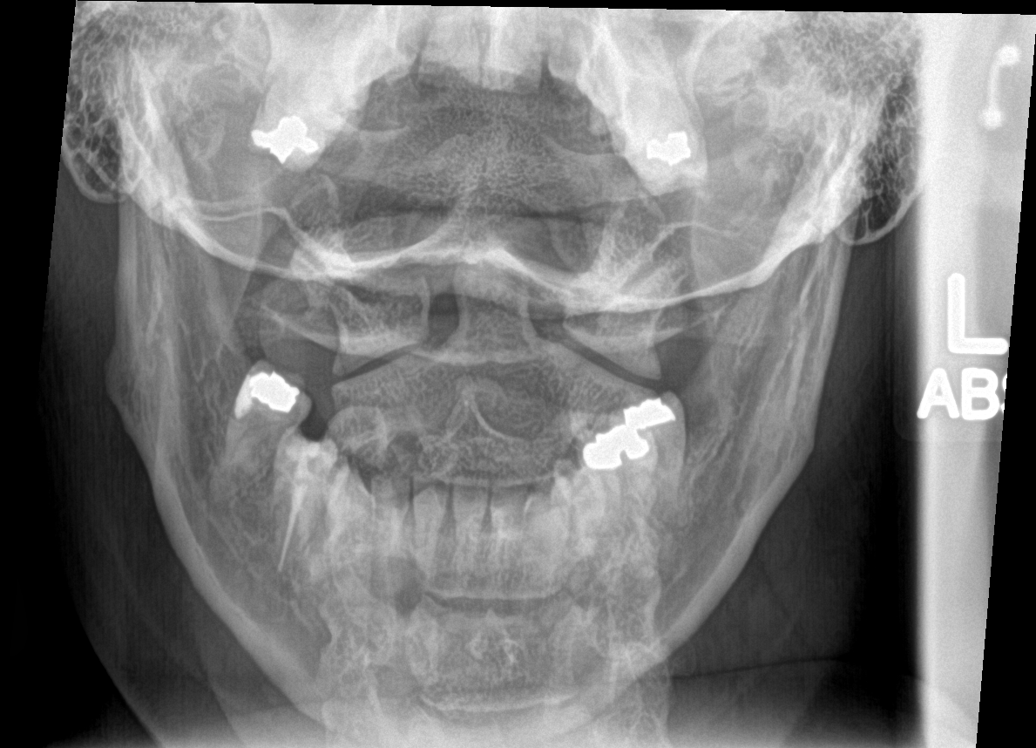

[5 of 5 positions shown; findings below may reference images not displayed]

FINDINGS: The cervical vertebrae are straightened in alignment. No significant
loss of intervertebral disc space is seen. There may be minimally
diminished disc space at C5-6. No prevertebral soft tissue swelling
is seen. No fracture is noted. On oblique views, there is mild
foraminal narrowing bilaterally at C5-6 and to a lesser degree at
C6-7. The odontoid process is intact. The lung apices are clear.
IMPRESSION: 1. Straightened alignment.
2. Possible mild degenerative disc disease at C5-6 with some
foraminal narrowing bilaterally at this level.

## 2015-08-30 MED ORDER — TIZANIDINE HCL 2 MG PO TABS: 2.0000 mg | ORAL_TABLET | Freq: Three times a day (TID) | ORAL | Status: DC | PRN

## 2015-08-30 MED ORDER — PROMETHAZINE HCL 25 MG PO TABS: 25.0000 mg | ORAL_TABLET | Freq: Three times a day (TID) | ORAL | Status: DC | PRN

## 2015-08-30 MED ORDER — KETOROLAC TROMETHAMINE 60 MG/2ML IM SOLN
60.0000 mg | Freq: Once | INTRAMUSCULAR | Status: AC
  Administered 2015-08-30: 60 mg via INTRAMUSCULAR

## 2015-08-30 MED ORDER — KETOROLAC TROMETHAMINE 60 MG/2ML IM SOLN: 60.0000 mg | Freq: Once | INTRAMUSCULAR | Status: DC

## 2015-08-30 NOTE — Progress Notes (Signed)
Name: Sally Mueller   MRN: [id number]    DOB: 1975-12-04   Date:08/30/2015       Progress Note  Subjective  Chief Complaint  Chief Complaint  Patient presents with  . Acute Visit    Migraine x3 days (Dr. Ancil Boozer Pt)    Migraine  This is a recurrent problem. The current episode started 1 to 4 weeks ago (4 days ago). The problem occurs constantly. The pain is located in the occipital and left unilateral region. The pain radiates to the right neck. The quality of the pain is described as throbbing and shooting. The pain is at a severity of 8/10. Associated symptoms include dizziness, a loss of balance, nausea and neck pain. Pertinent negatives include no fever, tingling or vomiting. She has tried triptans and oral narcotics for the symptoms.   Past Medical History  Diagnosis Date  . Migraine   . Obesity   . Headache   . Migraine without aura, with intractable migraine, so stated, with status migrainosus   . Metabolic syndrome   . Seborrhea   . Anxiety     Past Surgical History  Procedure Laterality Date  . Cholecystectomy  2008?  [name] Cesarean section  2000, 2002  . Tumor removal  2012?  [name] Tubal ligation  2002  . Lipoma excision  05/01/2014    Family History  Problem Relation Age of Onset  . Myasthenia gravis Sister   . Hyperlipidemia Father   . Cardiomyopathy Mother   . Hypertension Mother    Social History   Social History  . Marital Status: Single    Spouse Name: N/A  . Number of Children: 3  . Years of Education: N/A   Occupational History  . Not on file.   Social History Main Topics  . Smoking status: Former Smoker -- 8 years    Types: Cigarettes    Quit date: 11/17/2001  . Smokeless tobacco: Never Used  . Alcohol Use: 0.0 oz/week    0 Standard drinks or equivalent per week     Comment: occasionally-socially  . Drug Use: No  . Sexual Activity:    Partners: Male   Other Topics Concern  . Not on file   Social History Narrative    Current outpatient  prescriptions:  .  almotriptan (AXERT) 12.5 MG tablet, TK 1 T PO  AT ONSET OF MIGRAINE, Disp: , Rfl: 2 .  ALPRAZolam (ALPRAZOLAM XR) 0.5 MG 24 hr tablet, Take 1 tablet (0.5 mg total) by mouth daily., Disp: 30 tablet, Rfl: 2 .  ALPRAZolam (XANAX) 0.5 MG tablet, Take 1 tablet (0.5 mg total) by mouth daily as needed for anxiety., Disp: 30 tablet, Rfl: 0 .  hyoscyamine (LEVSIN) 0.125 MG tablet, Take 1 tablet (0.125 mg total) by mouth every 4 (four) hours as needed., Disp: 90 tablet, Rfl: 2 .  ibuprofen (ADVIL,MOTRIN) 800 MG tablet, Take 1 tablet by mouth as needed., Disp: , Rfl:  .  nortriptyline (PAMELOR) 10 MG capsule, Take 1 capsule (10 mg total) by mouth at bedtime., Disp: 30 capsule, Rfl: 2 .  promethazine (PHENERGAN) 12.5 MG tablet, Take 1 tablet by mouth every 4 (four) hours as needed., Disp: , Rfl:  .  rizatriptan (MAXALT) 10 MG tablet, Take 1 tablet (10 mg total) by mouth as needed for migraine. May repeat in 2 hours if needed, Disp: 4 tablet, Rfl: 2 .  traMADol (ULTRAM) 50 MG tablet, Take 1 tablet (50 mg total) by mouth 4 (four) times daily., Disp:  30 tablet, Rfl: 0  Allergies  Allergen Reactions  . Sumatriptan     crawling sensation in her body, flushed   Review of Systems  Constitutional: Positive for malaise/fatigue. Negative for fever and chills.  Gastrointestinal: Positive for nausea. Negative for vomiting.  Musculoskeletal: Positive for neck pain.  Neurological: Positive for dizziness, headaches and loss of balance. Negative for tingling.    Objective  Filed Vitals:   08/30/15 1429  BP: 128/76  Pulse: 98  Temp: 98.9 F (37.2 C)  TempSrc: Oral  Resp: 18  Height: 5\' 4"  (1.626 m)  Weight: 240 lb 1.6 oz (108.909 kg)  SpO2: 98%    Physical Exam  Constitutional: She is oriented to person, place, and time. Distressed: Patient appears in obvious pain. No other distress.  Cardiovascular: Normal rate and regular rhythm.   Pulmonary/Chest: Effort normal and breath sounds  normal.  Musculoskeletal: Normal range of motion.       Cervical back: She exhibits tenderness, pain and spasm.       Back:  Muscle spasm and tenderness on palpation over the right cervical spine area  Neurological: She is alert and oriented to person, place, and time.  Skin: Skin is warm and dry.  Nursing note and vitals reviewed.   Assessment & Plan  1. Migraine without aura, intractable Symptoms unresponsive to 2 different triptans. We will administer Toradol injection and prescription for promethazine for relief of nausea. - promethazine (PHENERGAN) 25 MG tablet; Take 1 tablet (25 mg total) by mouth every 8 (eight) hours as needed.  Dispense: 30 tablet; Refill: 0 - ketorolac (TORADOL) injection 60 mg; Inject 2 mLs (60 mg total) into the muscle once.  2. Posterior neck pain Suspect cervical spine muscle spasm. Obtain x-ray and started on muscle relaxant therapy. - DG Cervical Spine Complete; Future - tiZANidine (ZANAFLEX) 2 MG tablet; Take 1 tablet (2 mg total) by mouth every 8 (eight) hours as needed for muscle spasms.  Dispense: 30 tablet; Refill: 0   Sally Mueller [facility] 08/30/2015 2:46 PM

## 2015-10-18 ENCOUNTER — Encounter: Payer: Self-pay | Admitting: Family Medicine

## 2015-10-18 ENCOUNTER — Ambulatory Visit (INDEPENDENT_AMBULATORY_CARE_PROVIDER_SITE_OTHER): Payer: 59 | Admitting: Family Medicine

## 2015-10-18 VITALS — BP 122/74 | HR 91 | Temp 98.5°F | Resp 16 | Ht 64.0 in | Wt 236.1 lb

## 2015-10-18 DIAGNOSIS — K589 Irritable bowel syndrome without diarrhea: Secondary | ICD-10-CM | POA: Diagnosis not present

## 2015-10-18 DIAGNOSIS — F411 Generalized anxiety disorder: Secondary | ICD-10-CM

## 2015-10-18 DIAGNOSIS — G43009 Migraine without aura, not intractable, without status migrainosus: Secondary | ICD-10-CM | POA: Diagnosis not present

## 2015-10-18 MED ORDER — TRAMADOL HCL 50 MG PO TABS: 50.0000 mg | ORAL_TABLET | Freq: Four times a day (QID) | ORAL | Status: DC

## 2015-10-18 MED ORDER — ALPRAZOLAM ER 0.5 MG PO TB24: 0.5000 mg | ORAL_TABLET | Freq: Every day | ORAL | Status: DC

## 2015-10-18 MED ORDER — NORTRIPTYLINE HCL 10 MG PO CAPS: 10.0000 mg | ORAL_CAPSULE | Freq: Every day | ORAL | Status: DC

## 2015-10-18 MED ORDER — ALPRAZOLAM 0.5 MG PO TABS: 0.5000 mg | ORAL_TABLET | Freq: Every day | ORAL | Status: DC | PRN

## 2015-10-18 NOTE — Progress Notes (Signed)
Name: Sally Mueller   MRN: [id number]    DOB: 01/04/1976   Date:10/18/2015       Progress Note  Subjective  Chief Complaint  Chief Complaint  Patient presents with  . Follow-up    three month  . Migraine    has decreased, axert med helps.  . Irritable Bowel Syndrome    has changed her diet and improving    HPI  Migraine Headache: she is doing better, on Nortriptyline. Maxalt did not control her symptoms, she only responds to Axert, Headache does not have aura but is described as sharp/piercing behind left eye, severe, associated with nausea, phonophobia and photophobia and dizziness.  GAD: she states most of her stress recently is secondary to her son Rolla Plate that has ADHD and ODD ), she is taking Alprazolam XR and prn alprazolam. Also going to school.   IBS: she changed her diet since last visit and Nortriptyline is helping with abdominal pain/cramping. She has intermittent episodes of constipation and diarrhea.  Obesity: doing well with life style modification, lost 8 lbs since last visit. Eating less fried food and dairy. No bloating.    Patient Active Problem List   Diagnosis Date Noted  . Posterior neck pain 08/30/2015  . IBS (irritable bowel syndrome) 07/12/2015  . Anxiety, generalized 05/17/2015  . Tension headache 05/17/2015  . Dysmenorrhea 05/17/2015  . Dysmetabolic syndrome 99991111  . Migraine without aura, intractable 05/17/2015  . Extreme obesity ([place]) 05/17/2015    Past Surgical History  Procedure Laterality Date  . Cholecystectomy  2008?  [name] Cesarean section  2000, 2002  . Tumor removal  2012?  [name] Tubal ligation  2002  . Lipoma excision  05/01/2014    Family History  Problem Relation Age of Onset  . Myasthenia gravis Sister   . Hyperlipidemia Father   . Cardiomyopathy Mother   . Hypertension Mother     Social History   Social History  . Marital Status: Single    Spouse Name: N/A  . Number of Children: 3  . Years of Education: N/A    Occupational History  . Not on file.   Social History Main Topics  . Smoking status: Former Smoker -- 8 years    Types: Cigarettes    Quit date: 11/17/2001  . Smokeless tobacco: Never Used  . Alcohol Use: 0.0 oz/week    0 Standard drinks or equivalent per week     Comment: occasionally-socially  . Drug Use: No  . Sexual Activity:    Partners: Male   Other Topics Concern  . Not on file   Social History Narrative     Current outpatient prescriptions:  .  almotriptan (AXERT) 12.5 MG tablet, TK 1 T PO  AT ONSET OF MIGRAINE, Disp: , Rfl: 2 .  ALPRAZolam (ALPRAZOLAM XR) 0.5 MG 24 hr tablet, Take 1 tablet (0.5 mg total) by mouth daily., Disp: 30 tablet, Rfl: 2 .  ALPRAZolam (XANAX) 0.5 MG tablet, Take 1 tablet (0.5 mg total) by mouth daily as needed for anxiety., Disp: 30 tablet, Rfl: 0 .  ibuprofen (ADVIL,MOTRIN) 800 MG tablet, Take 1 tablet by mouth as needed., Disp: , Rfl:  .  nortriptyline (PAMELOR) 10 MG capsule, Take 1 capsule (10 mg total) by mouth at bedtime., Disp: 30 capsule, Rfl: 2 .  promethazine (PHENERGAN) 25 MG tablet, Take 1 tablet (25 mg total) by mouth every 8 (eight) hours as needed., Disp: 30 tablet, Rfl: 0 .  traMADol (ULTRAM) 50 MG tablet,  Take 1 tablet (50 mg total) by mouth 4 (four) times daily., Disp: 30 tablet, Rfl: 0  Allergies  Allergen Reactions  . Sumatriptan     crawling sensation in her body, flushed     ROS  Constitutional: Negative for fever , positive for weight change - loss.  Respiratory: Negative for cough and shortness of breath.   Cardiovascular: Negative for chest pain or palpitations.  Gastrointestinal: Negative for abdominal pain, no bowel changes.  Musculoskeletal: Negative for gait problem or joint swelling.  Skin: Negative for rash.  Neurological: Negative for dizziness but has intermittent  headache.  No other specific complaints in a complete review of systems (except as listed in HPI above).  Objective  Filed Vitals:    10/18/15 1510  BP: 122/74  Pulse: 91  Temp: 98.5 F (36.9 C)  TempSrc: Oral  Resp: 16  Height: 5\' 4"  (1.626 m)  Weight: 236 lb 1.6 oz (107.094 kg)  SpO2: 98%    Body mass index is 40.51 kg/(m^2).  Physical Exam  Constitutional: Patient appears well-developed and well-nourished. Obese No distress.  HEENT: head atraumatic, normocephalic, pupils equal and reactive to light,  neck supple, throat within normal limits Cardiovascular: Normal rate, regular rhythm and normal heart sounds.  No murmur heard. No BLE edema. Pulmonary/Chest: Effort normal and breath sounds normal. No respiratory distress. Abdominal: Soft.  There is no tenderness. Psychiatric: Patient has a normal mood and affect. behavior is normal. Judgment and thought content normal.   PHQ2/9: Depression screen [facility] 2/9 08/30/2015 05/17/2015  Decreased Interest 0 0  Down, Depressed, Hopeless 0 0  PHQ - 2 Score 0 0    Fall Risk: Fall Risk  08/30/2015 05/17/2015  Falls in the past year? No No     Assessment & Plan  1. Migraine without aura and without status migrainosus, not intractable  Continue prn medication  - traMADol (ULTRAM) 50 MG tablet; Take 1 tablet (50 mg total) by mouth 4 (four) times daily.  Dispense: 30 tablet; Refill: 0 - nortriptyline (PAMELOR) 10 MG capsule; Take 1 capsule (10 mg total) by mouth at bedtime.  Dispense: 30 capsule; Refill: 2  2. IBS (irritable bowel syndrome)  Doing well with change in diet and Nortripyline - nortriptyline (PAMELOR) 10 MG capsule; Take 1 capsule (10 mg total) by mouth at bedtime.  Dispense: 30 capsule; Refill: 2  3. Anxiety, generalized  Continue medication  - ALPRAZolam (ALPRAZOLAM XR) 0.5 MG 24 hr tablet; Take 1 tablet (0.5 mg total) by mouth daily.  Dispense: 30 tablet; Refill: 2 - ALPRAZolam (XANAX) 0.5 MG tablet; Take 1 tablet (0.5 mg total) by mouth daily as needed for anxiety.  Dispense: 30 tablet; Refill: 0  4. Morbid obesity, unspecified obesity type  [facility])  Discussed with the patient the risk posed by an increased BMI. Discussed importance of portion control, calorie counting and at least 150 minutes of physical activity weekly. Avoid sweet beverages and drink more water. Eat at least 6 servings of fruit and vegetables daily

## 2015-11-07 ENCOUNTER — Other Ambulatory Visit: Payer: Self-pay | Admitting: Family Medicine

## 2015-11-07 NOTE — Telephone Encounter (Signed)
Patient requesting refill. 

## 2015-12-04 ENCOUNTER — Telehealth: Payer: Self-pay | Admitting: Family Medicine

## 2015-12-04 NOTE — Telephone Encounter (Signed)
Requesting refill on Alprazolam fasting acting. Please send to [email or web address]. She is completely out. Patient thought she had one on file but the pharmacy is saying differently

## 2015-12-04 NOTE — Telephone Encounter (Signed)
Please disreguard the pharmacy called the patient back and told her that they did find the prescription, therefore they are going to fill it.

## 2015-12-31 ENCOUNTER — Other Ambulatory Visit: Payer: Self-pay | Admitting: Family Medicine

## 2015-12-31 DIAGNOSIS — G43009 Migraine without aura, not intractable, without status migrainosus: Secondary | ICD-10-CM

## 2015-12-31 MED ORDER — TRAMADOL HCL 50 MG PO TABS: 50.0000 mg | ORAL_TABLET | Freq: Four times a day (QID) | ORAL | Status: DC

## 2015-12-31 NOTE — Telephone Encounter (Signed)
Pt would like to know if she can get refill on Tramodol.

## 2016-01-17 ENCOUNTER — Ambulatory Visit: Payer: 59 | Admitting: Family Medicine

## 2016-02-12 ENCOUNTER — Ambulatory Visit (INDEPENDENT_AMBULATORY_CARE_PROVIDER_SITE_OTHER): Payer: 59 | Admitting: Family Medicine

## 2016-02-12 ENCOUNTER — Encounter: Payer: Self-pay | Admitting: Family Medicine

## 2016-02-12 VITALS — BP 118/72 | HR 82 | Temp 98.4°F | Resp 16 | Wt 234.8 lb

## 2016-02-12 DIAGNOSIS — F411 Generalized anxiety disorder: Secondary | ICD-10-CM

## 2016-02-12 DIAGNOSIS — G43009 Migraine without aura, not intractable, without status migrainosus: Secondary | ICD-10-CM

## 2016-02-12 DIAGNOSIS — K589 Irritable bowel syndrome without diarrhea: Secondary | ICD-10-CM | POA: Diagnosis not present

## 2016-02-12 DIAGNOSIS — E8881 Metabolic syndrome: Secondary | ICD-10-CM

## 2016-02-12 MED ORDER — NORTRIPTYLINE HCL 10 MG PO CAPS: 10.0000 mg | ORAL_CAPSULE | Freq: Every day | ORAL | Status: DC

## 2016-02-12 MED ORDER — TRAMADOL HCL 50 MG PO TABS: 50.0000 mg | ORAL_TABLET | Freq: Four times a day (QID) | ORAL | Status: DC

## 2016-02-12 MED ORDER — ALPRAZOLAM 0.5 MG PO TABS: 0.5000 mg | ORAL_TABLET | Freq: Every day | ORAL | Status: DC | PRN

## 2016-02-12 MED ORDER — ALPRAZOLAM ER 1 MG PO TB24: 1.0000 mg | ORAL_TABLET | Freq: Every day | ORAL | Status: DC

## 2016-02-12 NOTE — Progress Notes (Signed)
Name: Sally Mueller   MRN: [id number]    DOB: 03/29/1976   Date:02/12/2016       Progress Note  Subjective  Chief Complaint  Chief Complaint  Patient presents with  . Migraine  . Irritable Bowel Syndrome  . Anxiety  . Obesity  . Medication Refill    HPI  Migraine Headache: she is doing better, on Nortriptyline. Maxalt did not control her symptoms, she only responds to Axert, Headache does not have aura but is described as sharp/piercing behind left eye, severe, associated with nausea, phonophobia and photophobia and dizziness. Only one episode of migraine since December. She still has some tension headaches and takes Tramadol prn for that.  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 She also has a lot of stress from  her son Rolla Plate that has ADHD and ODD ), she is taking Alprazolam XR and prn alprazolam, but Xanax XR does not last all day. Also going to school and has a full time job at [employer], and part time at [employer]. Feeling overwhelmed. She is going to finish her degree this Summer and hopefully will feel better.  IBS: she changed her diet since last visit and Nortriptyline is helping with abdominal pain/cramping. She has intermittent episodes of constipation and diarrhea. She has eliminated dairy and fatty meals, also decrease amount of soda intake.   Obesity: doing well with life style modification, lost 10 lbs since September 2016t. Eating less fried food and dairy. No bloating.   Metabolic syndrome: she denies polyphagia, polydipsia or polyuria.    Patient Active Problem List   Diagnosis Date Noted  . Posterior neck pain 08/30/2015  . IBS (irritable bowel syndrome) 07/12/2015  . Anxiety, generalized 05/17/2015  . Tension headache 05/17/2015  . Dysmenorrhea 05/17/2015  . Dysmetabolic syndrome 99991111  . Migraine without aura, intractable 05/17/2015  . Extreme  obesity ([place]) 05/17/2015    Past Surgical History  Procedure Laterality Date  . Cholecystectomy  2008?  [name] Cesarean section  2000, 2002  . Tumor removal  2012?  [name] Tubal ligation  2002  . Lipoma excision  05/01/2014    Family History  Problem Relation Age of Onset  . Myasthenia gravis Sister   . Hyperlipidemia Father   . Cardiomyopathy Mother   . Hypertension Mother     Social History   Social History  . Marital Status: Single    Spouse Name: N/A  . Number of Children: 3  . Years of Education: N/A   Occupational History  . Not on file.   Social History Main Topics  . Smoking status: Former Smoker -- 8 years    Types: Cigarettes    Quit date: 11/17/2001  . Smokeless tobacco: Never Used  . Alcohol Use: 0.0 oz/week    0 Standard drinks or equivalent per week     Comment: occasionally-socially  . Drug Use: No  . Sexual Activity:    Partners: Male   Other Topics Concern  . Not on file   Social History Narrative     Current outpatient prescriptions:  .  almotriptan (AXERT) 12.5 MG tablet, TK 1 T PO  AT ONSET OF MIGRAINE, Disp: , Rfl: 2 .  ALPRAZolam (XANAX XR) 1 MG 24 hr tablet, Take 1 tablet (1 mg total) by mouth daily., Disp: 30 tablet, Rfl: 2 .  ALPRAZolam (XANAX) 0.5 MG tablet, Take 1  tablet (0.5 mg total) by mouth daily as needed for anxiety., Disp: 30 tablet, Rfl: 0 .  nortriptyline (PAMELOR) 10 MG capsule, Take 1 capsule (10 mg total) by mouth at bedtime., Disp: 30 capsule, Rfl: 2 .  promethazine (PHENERGAN) 25 MG tablet, Take 1 tablet (25 mg total) by mouth every 8 (eight) hours as needed., Disp: 30 tablet, Rfl: 0 .  traMADol (ULTRAM) 50 MG tablet, Take 1 tablet (50 mg total) by mouth 4 (four) times daily., Disp: 30 tablet, Rfl: 0  Allergies  Allergen Reactions  . Sumatriptan     crawling sensation in her body, flushed     ROS  Ten systems reviewed and is negative except as mentioned in HPI   Objective  Filed Vitals:   02/12/16 1443  BP: 118/72   Pulse: 82  Temp: 98.4 F (36.9 C)  TempSrc: Oral  Resp: 16  Weight: 234 lb 12.8 oz (106.505 kg)  SpO2: 94%    Body mass index is 40.28 kg/(m^2).  Physical Exam  Constitutional: Patient appears well-developed and well-nourished. Obese  No distress.  HEENT: head atraumatic, normocephalic, pupils equal and reactive to light neck supple, throat within normal limits Cardiovascular: Normal rate, regular rhythm and normal heart sounds.  No murmur heard. No BLE edema. Pulmonary/Chest: Effort normal and breath sounds normal. No respiratory distress. Abdominal: Soft.  There is no tenderness. Psychiatric: Patient has a normal mood and affect. behavior is normal. Judgment and thought content normal.  PHQ2/9: Depression screen [facility] 2/9 02/12/2016 08/30/2015 05/17/2015  Decreased Interest 0 0 0  Down, Depressed, Hopeless 0 0 0  PHQ - 2 Score 0 0 0    Fall Risk: Fall Risk  02/12/2016 08/30/2015 05/17/2015  Falls in the past year? No No No    Functional Status Survey: Is the patient deaf or have difficulty hearing?: No Does the patient have difficulty seeing, even when wearing glasses/contacts?: No Does the patient have difficulty concentrating, remembering, or making decisions?: No Does the patient have difficulty walking or climbing stairs?: No Does the patient have difficulty dressing or bathing?: No Does the patient have difficulty doing errands alone such as visiting a doctor's office or shopping?: No    Assessment & Plan  1. Migraine without aura and without status migrainosus, not intractable  - nortriptyline (PAMELOR) 10 MG capsule; Take 1 capsule (10 mg total) by mouth at bedtime.  Dispense: 30 capsule; Refill: 2 - traMADol (ULTRAM) 50 MG tablet; Take 1 tablet (50 mg total) by mouth 4 (four) times daily.  Dispense: 30 tablet; Refill: 0  2. IBS (irritable bowel syndrome)  - nortriptyline (PAMELOR) 10 MG capsule; Take 1 capsule (10 mg total) by mouth at bedtime.  Dispense: 30  capsule; Refill: 2  3. Anxiety, generalized  We will increase dose of Xanax XR to last all day - ALPRAZolam (XANAX XR) 1 MG 24 hr tablet; Take 1 tablet (1 mg total) by mouth daily.  Dispense: 30 tablet; Refill: 2 - ALPRAZolam (XANAX) 0.5 MG tablet; Take 1 tablet (0.5 mg total) by mouth daily as needed for anxiety.  Dispense: 30 tablet; Refill: 0  4. Morbid obesity, unspecified obesity type [facility])  Discussed with the patient the risk posed by an increased BMI. Discussed importance of portion control, calorie counting and at least 150 minutes of physical activity weekly. Avoid sweet beverages and drink more water. Eat at least 6 servings of fruit and vegetables daily   5. Dysmetabolic syndrome  Continue life style modification

## 2016-04-17 ENCOUNTER — Other Ambulatory Visit: Payer: Self-pay | Admitting: Family Medicine

## 2016-04-18 NOTE — Telephone Encounter (Signed)
Patient requesting refill. 

## 2016-05-09 ENCOUNTER — Encounter: Payer: Self-pay | Admitting: Family Medicine

## 2016-05-09 ENCOUNTER — Ambulatory Visit (INDEPENDENT_AMBULATORY_CARE_PROVIDER_SITE_OTHER): Payer: 59 | Admitting: Family Medicine

## 2016-05-09 VITALS — BP 112/76 | HR 80 | Temp 98.2°F | Resp 16 | Ht 64.0 in | Wt 236.6 lb

## 2016-05-09 DIAGNOSIS — E8881 Metabolic syndrome: Secondary | ICD-10-CM

## 2016-05-09 DIAGNOSIS — Z79899 Other long term (current) drug therapy: Secondary | ICD-10-CM

## 2016-05-09 DIAGNOSIS — G44209 Tension-type headache, unspecified, not intractable: Secondary | ICD-10-CM

## 2016-05-09 DIAGNOSIS — G43009 Migraine without aura, not intractable, without status migrainosus: Secondary | ICD-10-CM | POA: Diagnosis not present

## 2016-05-09 DIAGNOSIS — F411 Generalized anxiety disorder: Secondary | ICD-10-CM

## 2016-05-09 DIAGNOSIS — K589 Irritable bowel syndrome without diarrhea: Secondary | ICD-10-CM | POA: Diagnosis not present

## 2016-05-09 DIAGNOSIS — Z13220 Encounter for screening for lipoid disorders: Secondary | ICD-10-CM | POA: Diagnosis not present

## 2016-05-09 MED ORDER — TRAMADOL HCL 50 MG PO TABS: 100.0000 mg | ORAL_TABLET | Freq: Four times a day (QID) | ORAL | Status: DC

## 2016-05-09 MED ORDER — ALPRAZOLAM ER 1 MG PO TB24: 1.0000 mg | ORAL_TABLET | Freq: Every day | ORAL | Status: DC

## 2016-05-09 MED ORDER — NORTRIPTYLINE HCL 10 MG PO CAPS: 10.0000 mg | ORAL_CAPSULE | Freq: Every day | ORAL | Status: DC

## 2016-05-09 MED ORDER — ALPRAZOLAM 0.5 MG PO TABS: 0.5000 mg | ORAL_TABLET | Freq: Every day | ORAL | Status: DC | PRN

## 2016-05-09 NOTE — Progress Notes (Signed)
Name: Sally Mueller   MRN: [id number]    DOB: 1976/02/15   Date:05/09/2016       Progress Note  Subjective  Chief Complaint  Chief Complaint  Patient presents with  . Medication Refill    3 month F/U  . Irritable Bowel Syndrome    Unchanged, states some days are still pretty bad  . Migraine    Patient only has had one small one in the past 3 months   . Anxiety    Unchanged, patient is in her last semester at [employer] before having her Bachelor's Degree    HPI  Migraine Headache: she is doing better, on Nortriptyline. Maxalt did not control her symptoms, she only responds to Axert, Headache does not have aura but is described as sharp/piercing behind left eye, severe, associated with nausea, phonophobia and photophobia and dizziness. Only one episode of migraine since last visit. . She still has some tension headaches and takes Tramadol prn for that. Usually takes 2 at once and it prevents from the tension headache into going to full blown headache  GAD: She is during Summer break. She also has a lot of stress from her son Sally Mueller that has ADHD and ODD ), she is taking Alprazolam XR and prn alprazolam, but Xanax XR does not last all day. Also going to school - taking her last class right now, and has a full time job at [employer] but is Summer break, but still has a  part time at [employer]. She is more aware of the triggers for her anxiety - she has panic when going groceries shopping  IBS: she changed her diet since last visit and Nortriptyline is helping with abdominal pain/cramping. She has intermittent episodes of constipation and diarrhea. She has eliminated dairy and fatty meals, also decrease amount of soda intake.   Obesity: doing well with life style modification, lost 11 lbs since September 2016t. Eating less fried food and dairy. No bloating.   Metabolic syndrome: she denies polyphagia, polydipsia or polyuria, she is due for labs   Patient Active Problem List    Diagnosis Date Noted  . Posterior neck pain 08/30/2015  . IBS (irritable bowel syndrome) 07/12/2015  . Anxiety, generalized 05/17/2015  . Tension headache 05/17/2015  . Dysmenorrhea 05/17/2015  . Dysmetabolic syndrome 99991111  . Migraine without aura, intractable 05/17/2015  . Extreme obesity ([place]) 05/17/2015    Past Surgical History  Procedure Laterality Date  . Cholecystectomy  2008?  [name] Cesarean section  2000, 2002  . Tumor removal  2012?  [name] Tubal ligation  2002  . Lipoma excision  05/01/2014    Family History  Problem Relation Age of Onset  . Myasthenia gravis Sister   . Hyperlipidemia Father   . Cardiomyopathy Mother   . Hypertension Mother     Social History   Social History  . Marital Status: Single    Spouse Name: N/A  . Number of Children: 3  . Years of Education: N/A   Occupational History  . Not on file.   Social History Main Topics  . Smoking status: Former Smoker -- 8 years    Types: Cigarettes    Quit date: 11/17/2001  . Smokeless tobacco: Never Used  . Alcohol Use: 0.0 oz/week    0 Standard drinks or equivalent per week     Comment: occasionally-socially  . Drug Use: No  . Sexual Activity:    Partners: Male   Other Topics Concern  .  Not on file   Social History Narrative     Current outpatient prescriptions:  .  almotriptan (AXERT) 12.5 MG tablet, TAKE 1 TABLET BY MOUTH AT ONSET OF MIGRAINE, Disp: 9 tablet, Rfl: 0 .  ALPRAZolam (XANAX XR) 1 MG 24 hr tablet, Take 1 tablet (1 mg total) by mouth daily., Disp: 30 tablet, Rfl: 2 .  ALPRAZolam (XANAX) 0.5 MG tablet, Take 1 tablet (0.5 mg total) by mouth daily as needed for anxiety., Disp: 30 tablet, Rfl: 0 .  nortriptyline (PAMELOR) 10 MG capsule, Take 1 capsule (10 mg total) by mouth at bedtime., Disp: 30 capsule, Rfl: 2 .  promethazine (PHENERGAN) 25 MG tablet, Take 1 tablet (25 mg total) by mouth every 8 (eight) hours as needed., Disp: 30 tablet, Rfl: 0 .  traMADol (ULTRAM) 50 MG tablet,  Take 2 tablets (100 mg total) by mouth 4 (four) times daily., Disp: 60 tablet, Rfl: 0  Allergies  Allergen Reactions  . Sumatriptan     crawling sensation in her body, flushed     ROS  Constitutional: Negative for fever or weight change.  Respiratory: Negative for cough and shortness of breath.   Cardiovascular: Negative for chest pain or palpitations.  Gastrointestinal: Negative for abdominal pain, no bowel changes.  Musculoskeletal: Negative for gait problem or joint swelling.  Skin: Negative for rash.  Neurological: Negative for dizziness , positive for intermittent  headache.  No other specific complaints in a complete review of systems (except as listed in HPI above).  Objective  Filed Vitals:   05/09/16 1533  BP: 112/76  Pulse: 80  Temp: 98.2 F (36.8 C)  TempSrc: Oral  Resp: 16  Height: 5\' 4"  (1.626 m)  Weight: 236 lb 9.6 oz (107.321 kg)  SpO2: 97%    Body mass index is 40.59 kg/(m^2).  Physical Exam  Constitutional: Patient appears well-developed and well-nourished. Obese  No distress.  HEENT: head atraumatic, normocephalic, pupils equal and reactive to light,neck supple, throat within normal limits Cardiovascular: Normal rate, regular rhythm and normal heart sounds.  No murmur heard. No BLE edema. Pulmonary/Chest: Effort normal and breath sounds normal. No respiratory distress. Abdominal: Soft.  There is no tenderness. Psychiatric: Patient has a normal mood and affect. behavior is normal. Judgment and thought content normal.  PHQ2/9: Depression screen [facility] 2/9 05/09/2016 02/12/2016 08/30/2015 05/17/2015  Decreased Interest 0 0 0 0  Down, Depressed, Hopeless 0 0 0 0  PHQ - 2 Score 0 0 0 0    Fall Risk: Fall Risk  05/09/2016 02/12/2016 08/30/2015 05/17/2015  Falls in the past year? No No No No    Functional Status Survey: Is the patient deaf or have difficulty hearing?: No Does the patient have difficulty seeing, even when wearing glasses/contacts?:  No Does the patient have difficulty concentrating, remembering, or making decisions?: No Does the patient have difficulty walking or climbing stairs?: No Does the patient have difficulty dressing or bathing?: No Does the patient have difficulty doing errands alone such as visiting a doctor's office or shopping?: No  Assessment & Plan  1. Migraine without aura and without status migrainosus, not intractable  - traMADol (ULTRAM) 50 MG tablet; Take 2 tablets (100 mg total) by mouth 4 (four) times daily.  Dispense: 60 tablet; Refill: 0 - nortriptyline (PAMELOR) 10 MG capsule; Take 1 capsule (10 mg total) by mouth at bedtime.  Dispense: 30 capsule; Refill: 2  2. IBS (irritable bowel syndrome)  - nortriptyline (PAMELOR) 10 MG capsule; Take 1 capsule (10  mg total) by mouth at bedtime.  Dispense: 30 capsule; Refill: 2  3. Anxiety, generalized  - ALPRAZolam (XANAX XR) 1 MG 24 hr tablet; Take 1 tablet (1 mg total) by mouth daily.  Dispense: 30 tablet; Refill: 2 - ALPRAZolam (XANAX) 0.5 MG tablet; Take 1 tablet (0.5 mg total) by mouth daily as needed for anxiety.  Dispense: 30 tablet; Refill: 0  4. Morbid obesity, unspecified obesity type [facility])  Discussed with the patient the risk posed by an increased BMI. Discussed importance of portion control, calorie counting and at least 150 minutes of physical activity weekly. Avoid sweet beverages and drink more water. Eat at least 6 servings of fruit and vegetables daily   5. Dysmetabolic syndrome  - Comprehensive metabolic panel - Hemoglobin A1c  6. Tension headache  Continue Tramadol prn   7. Long-term use of high-risk medication  Check labs  8. Lipid screening  - Lipid panel

## 2016-05-10 LAB — COMPREHENSIVE METABOLIC PANEL
ALBUMIN: 3.9 g/dL (ref 3.6–5.1)
ALK PHOS: 75 U/L (ref 33–115)
ALT: 43 U/L — AB (ref 6–29)
AST: 26 U/L (ref 10–30)
BILIRUBIN TOTAL: 0.8 mg/dL (ref 0.2–1.2)
BUN: 13 mg/dL (ref 7–25)
CO2: 26 mmol/L (ref 20–31)
Calcium: 8.9 mg/dL (ref 8.6–10.2)
Chloride: 103 mmol/L (ref 98–110)
Creat: 0.74 mg/dL (ref 0.50–1.10)
GLUCOSE: 100 mg/dL — AB (ref 65–99)
POTASSIUM: 3.9 mmol/L (ref 3.5–5.3)
Sodium: 135 mmol/L (ref 135–146)
Total Protein: 6.6 g/dL (ref 6.1–8.1)

## 2016-05-10 LAB — LIPID PANEL
CHOL/HDL RATIO: 4.4 ratio (ref <=5.0)
CHOLESTEROL: 174 mg/dL (ref 125–200)
HDL: 40 mg/dL — ABNORMAL LOW (ref >=46)
LDL Cholesterol: 109 mg/dL (ref <130)
TRIGLYCERIDES: 124 mg/dL (ref <150)
VLDL: 25 mg/dL (ref <30)

## 2016-05-10 LAB — HEMOGLOBIN A1C
Hgb A1c MFr Bld: 5.3 % (ref <5.7)
Mean Plasma Glucose: 105 mg/dL

## 2016-05-12 ENCOUNTER — Telehealth: Payer: Self-pay

## 2016-05-12 NOTE — Telephone Encounter (Signed)
No voicemail set up 

## 2016-05-12 NOTE — Telephone Encounter (Signed)
-----   Message from Steele Sizer, MD sent at 05/10/2016  8:52 PM EDT ----- hgbA1C is normal  Based on the results of lipid panel his cardiovascular risk factor ( using [facility] )  in the next 10 years is : 0.6% Glucose is borderline, but normal hgbA1C,. Normal kidney function. ALT is slightly up, otherwise normal

## 2016-06-13 ENCOUNTER — Ambulatory Visit: Payer: 59 | Admitting: Family Medicine

## 2016-07-02 ENCOUNTER — Other Ambulatory Visit: Payer: Self-pay | Admitting: Family Medicine

## 2016-07-02 ENCOUNTER — Telehealth: Payer: Self-pay | Admitting: Family Medicine

## 2016-07-02 DIAGNOSIS — G43019 Migraine without aura, intractable, without status migrainosus: Secondary | ICD-10-CM

## 2016-07-02 MED ORDER — PROMETHAZINE HCL 25 MG PO TABS: 25.0000 mg | ORAL_TABLET | Freq: Three times a day (TID) | ORAL | 0 refills | Status: DC | PRN

## 2016-07-09 NOTE — Telephone Encounter (Signed)
COMPLETED

## 2016-07-16 ENCOUNTER — Encounter: Payer: 59 | Admitting: Family Medicine

## 2016-08-04 ENCOUNTER — Ambulatory Visit (INDEPENDENT_AMBULATORY_CARE_PROVIDER_SITE_OTHER): Payer: 59 | Admitting: Family Medicine

## 2016-08-04 ENCOUNTER — Encounter: Payer: Self-pay | Admitting: Family Medicine

## 2016-08-04 VITALS — BP 118/74 | HR 85 | Temp 98.2°F | Resp 18 | Ht 64.0 in | Wt 241.0 lb

## 2016-08-04 DIAGNOSIS — Z23 Encounter for immunization: Secondary | ICD-10-CM | POA: Diagnosis not present

## 2016-08-04 DIAGNOSIS — G43109 Migraine with aura, not intractable, without status migrainosus: Secondary | ICD-10-CM | POA: Diagnosis not present

## 2016-08-04 DIAGNOSIS — E8881 Metabolic syndrome: Secondary | ICD-10-CM

## 2016-08-04 DIAGNOSIS — G44209 Tension-type headache, unspecified, not intractable: Secondary | ICD-10-CM

## 2016-08-04 DIAGNOSIS — K589 Irritable bowel syndrome without diarrhea: Secondary | ICD-10-CM | POA: Diagnosis not present

## 2016-08-04 DIAGNOSIS — F411 Generalized anxiety disorder: Secondary | ICD-10-CM | POA: Diagnosis not present

## 2016-08-04 MED ORDER — ALMOTRIPTAN MALATE 12.5 MG PO TABS: ORAL_TABLET | ORAL | 0 refills | Status: DC

## 2016-08-04 MED ORDER — NORTRIPTYLINE HCL 10 MG PO CAPS: 10.0000 mg | ORAL_CAPSULE | Freq: Every day | ORAL | 2 refills | Status: DC

## 2016-08-04 MED ORDER — ALPRAZOLAM ER 1 MG PO TB24: 1.0000 mg | ORAL_TABLET | Freq: Every day | ORAL | 2 refills | Status: DC

## 2016-08-04 MED ORDER — ESCITALOPRAM OXALATE 10 MG PO TABS: 10.0000 mg | ORAL_TABLET | Freq: Every day | ORAL | 0 refills | Status: DC

## 2016-08-04 MED ORDER — TRAMADOL HCL 50 MG PO TABS: 100.0000 mg | ORAL_TABLET | Freq: Four times a day (QID) | ORAL | 0 refills | Status: DC

## 2016-08-04 MED ORDER — QUETIAPINE FUMARATE 25 MG PO TABS: 25.0000 mg | ORAL_TABLET | Freq: Every day | ORAL | 0 refills | Status: DC

## 2016-08-04 MED ORDER — CLONAZEPAM 0.5 MG PO TABS: 0.5000 mg | ORAL_TABLET | Freq: Every day | ORAL | 0 refills | Status: DC | PRN

## 2016-08-04 NOTE — Progress Notes (Signed)
Name: Sally Mueller   MRN: [id number]    DOB: [birth year]   Date:08/04/2016       Progress Note  Subjective  Chief Complaint  Chief Complaint  Patient presents with  . Medication Refill    3 month F/U  . Migraine    In the last 3 months she has only had 2 migraines due to medication helping  her symptoms.   . Anxiety    Patient is now going through the Licenses process and just having anxiety about going through it.   . Irritable Bowel Syndrome    Patient states she has good and bad days with it.     HPI  Migraine Headache: she is doing better, on Nortriptyline. Maxalt did not control her symptoms, she only responds to Axert. She has aura ( she has a sensation of burning / like her nose will bleed ) and migraine happens if she does not take medication for it, pain  as sharp/piercing behind left eye, severe, associated with nausea, phonophobia and photophobia and dizziness. She only had two episodes of migraines in the past few months. .She still has some tension headaches and takes Tramadol prn for that. Usually takes 2 at once and it prevents from the tension headache into going to full blown headache.  GAD: She is during Summer break. She also has a lot of stress from her son Rolla Plate that has ADHD and ODD ), she states she worries all the time, having the inability to go grocery shopping - can't make decisions when going to [place].  she is taking Alprazolam XR and prn alprazolam, she states Xanax XR works but when she needs to take an Alprazolam prn it no longer works. She also has difficulty falling asleep, difficulty falling asleep and staying asleep. She has tried Cymbalta but did not work, she is willing to try Lexapro and therapy  IBS: she changed her diet since last visit and Nortriptyline is helping with abdominal pain/cramping. She has intermittent episodes of constipation and diarrhea. She has eliminated dairy and fatty meals, also decrease amount of soda intake.   Obesity: doing  well with life style modification, she was losing weight, but gained a few pounds since last visit.  Eating less fried food and dairy. No bloating. She states she was not very active during the Summer  Metabolic syndrome: she denies polyphagia, polydipsia or polyuria   Patient Active Problem List   Diagnosis Date Noted  . Posterior neck pain 08/30/2015  . IBS (irritable bowel syndrome) 07/12/2015  . Anxiety, generalized 05/17/2015  . Tension headache 05/17/2015  . Dysmenorrhea 05/17/2015  . Dysmetabolic syndrome [id number]  . Migraine with aura and without status migrainosus 05/17/2015  . Extreme obesity ([place]) 05/17/2015    Past Surgical History:  Procedure Laterality Date  . CESAREAN SECTION  2000, 2002  . CHOLECYSTECTOMY  2008?  [name] LIPOMA EXCISION  05/01/2014  . TUBAL LIGATION  2002  . TUMOR REMOVAL  2012?    Family History  Problem Relation Age of Onset  . Myasthenia gravis Sister   . Hyperlipidemia Father   . Cardiomyopathy Mother   . Hypertension Mother     Social History   Social History  . Marital status: Single    Spouse name: N/A  . Number of children: 3  . Years of education: N/A   Occupational History  . Not on file.   Social History Main Topics  . Smoking status: Former Smoker    Years:  8.00    Types: Cigarettes    Start date: 11/17/1993    Quit date: 11/17/2001  . Smokeless tobacco: Never Used  . Alcohol use 0.0 oz/week     Comment: occasionally-socially  . Drug use: No  . Sexual activity: Yes    Partners: Male   Other Topics Concern  . Not on file   Social History Narrative  . No narrative on file     Current Outpatient Prescriptions:  .  almotriptan (AXERT) 12.5 MG tablet, TAKE 1 TABLET BY MOUTH AT ONSET OF MIGRAINE, Disp: 9 tablet, Rfl: 0 .  ALPRAZolam (XANAX XR) 1 MG 24 hr tablet, Take 1 tablet (1 mg total) by mouth daily. Fill 08/08/2016, Disp: 30 tablet, Rfl: 2 .  nortriptyline (PAMELOR) 10 MG capsule, Take 1 capsule (10 mg total) by  mouth at bedtime., Disp: 30 capsule, Rfl: 2 .  promethazine (PHENERGAN) 25 MG tablet, Take 1 tablet (25 mg total) by mouth every 8 (eight) hours as needed., Disp: 30 tablet, Rfl: 0 .  traMADol (ULTRAM) 50 MG tablet, Take 2 tablets (100 mg total) by mouth 4 (four) times daily., Disp: 60 tablet, Rfl: 0 .  clonazePAM (KLONOPIN) 0.5 MG tablet, Take 1 tablet (0.5 mg total) by mouth daily as needed for anxiety. Must last 90 days At most one daily, Disp: 30 tablet, Rfl: 0 .  escitalopram (LEXAPRO) 10 MG tablet, Take 1 tablet (10 mg total) by mouth daily., Disp: 30 tablet, Rfl: 0 .  QUEtiapine (SEROQUEL) 25 MG tablet, Take 1 tablet (25 mg total) by mouth at bedtime. For sleep, Disp: 30 tablet, Rfl: 0  Allergies  Allergen Reactions  . Sumatriptan     crawling sensation in her body, flushed     ROS  Constitutional: Negative for fever or weight change.  Respiratory: Negative for cough and shortness of breath.   Cardiovascular: Negative for chest pain or palpitations.  Gastrointestinal: Negative for abdominal pain, no bowel changes.  Musculoskeletal: Negative for gait problem or joint swelling.  Skin: Negative for rash.  Neurological: Negative for dizziness , positive for  headache.  No other specific complaints in a complete review of systems (except as listed in HPI above).  Objective  Vitals:   08/04/16 1515  BP: 118/74  Pulse: 85  Resp: 18  Temp: 98.2 F (36.8 C)  TempSrc: Oral  SpO2: 97%  Weight: 241 lb (109.3 kg)  Height: 5\' 4"  (1.626 m)    Body mass index is 41.37 kg/m.  Physical Exam  Constitutional: Patient appears well-developed and well-nourished. Obese  No distress.  HEENT: head atraumatic, normocephalic, pupils equal and reactive to light,  neck supple, throat within normal limits Cardiovascular: Normal rate, regular rhythm and normal heart sounds.  No murmur heard. No BLE edema. Pulmonary/Chest: Effort normal and breath sounds normal. No respiratory  distress. Abdominal: Soft.  There is no tenderness. Psychiatric: Patient has a normal mood and affect. behavior is normal. Judgment and thought content normal.  Recent Results (from the past 2160 hour(s))  Lipid panel     Status: Abnormal   Collection Time: 05/09/16  4:12 PM  Result Value Ref Range   Cholesterol 174 125 - 200 mg/dL   Triglycerides 124 <150 mg/dL   HDL 40 (L) >=46 mg/dL   Total CHOL/HDL Ratio 4.4 <=5.0 Ratio   VLDL 25 <30 mg/dL   LDL Cholesterol 109 <130 mg/dL    Comment:   Total Cholesterol/HDL Ratio:CHD Risk  Coronary Heart Disease Risk Table                                        Men       Women          1/2 Average Risk              3.4        3.3              Average Risk              5.0        4.4           2X Average Risk              9.6        7.1           3X Average Risk             23.4       11.0 Use the calculated Patient Ratio above and the CHD Risk table  to determine the patient's CHD Risk.   Comprehensive metabolic panel     Status: Abnormal   Collection Time: 05/09/16  4:12 PM  Result Value Ref Range   Sodium 135 135 - 146 mmol/L   Potassium 3.9 3.5 - 5.3 mmol/L   Chloride 103 98 - 110 mmol/L   CO2 26 20 - 31 mmol/L   Glucose, Bld 100 (H) 65 - 99 mg/dL   BUN 13 7 - 25 mg/dL   Creat 0.74 0.50 - 1.10 mg/dL   Total Bilirubin 0.8 0.2 - 1.2 mg/dL   Alkaline Phosphatase 75 33 - 115 U/L   AST 26 10 - 30 U/L   ALT 43 (H) 6 - 29 U/L   Total Protein 6.6 6.1 - 8.1 g/dL   Albumin 3.9 3.6 - 5.1 g/dL   Calcium 8.9 8.6 - 10.2 mg/dL  Hemoglobin A1c     Status: None   Collection Time: 05/09/16  4:12 PM  Result Value Ref Range   Hgb A1c MFr Bld 5.3 <5.7 %    Comment:   For the purpose of screening for the presence of diabetes:   <5.7%       Consistent with the absence of diabetes 5.7-6.4 %   Consistent with increased risk for diabetes (prediabetes) >=6.5 %     Consistent with diabetes   This assay result is consistent with a  decreased risk of diabetes.   Currently, no consensus exists regarding use of hemoglobin A1c for diagnosis of diabetes in children.   According to American Diabetes Association (ADA) guidelines, hemoglobin A1c <7.0% represents optimal control in non-pregnant diabetic patients. Different metrics may apply to specific patient populations. Standards of Medical Care in Diabetes (ADA).      Mean Plasma Glucose 105 mg/dL     PHQ2/9: Depression screen [facility] 2/9 08/04/2016 05/09/2016 02/12/2016 08/30/2015 05/17/2015  Decreased Interest 0 0 0 0 0  Down, Depressed, Hopeless 0 0 0 0 0  PHQ - 2 Score 0 0 0 0 0    Fall Risk: Fall Risk  08/04/2016 05/09/2016 02/12/2016 08/30/2015 05/17/2015  Falls in the past year? No No No No No     Functional Status Survey: Is the patient deaf or have difficulty hearing?: No Does the patient have difficulty seeing, even when wearing glasses/contacts?: No  Does the patient have difficulty concentrating, remembering, or making decisions?: No Does the patient have difficulty walking or climbing stairs?: No Does the patient have difficulty dressing or bathing?: No Does the patient have difficulty doing errands alone such as visiting a doctor's office or shopping?: No  GAD 7 : Generalized Anxiety Score 08/04/2016  Nervous, Anxious, on Edge 3  Control/stop worrying 3  Worry too much - different things 3  Trouble relaxing 3  Restless 0  Easily annoyed or irritable 3  Afraid - awful might happen 2  Total GAD 7 Score 17  Anxiety Difficulty Very difficult      Assessment & Plan  1. Migraine without aura and without status migrainosus, not intractable  - nortriptyline (PAMELOR) 10 MG capsule; Take 1 capsule (10 mg total) by mouth at bedtime.  Dispense: 30 capsule; Refill: 2 - traMADol (ULTRAM) 50 MG tablet; Take 2 tablets (100 mg total) by mouth 4 (four) times daily.  Dispense: 60 tablet; Refill: 0 - almotriptan (AXERT) 12.5 MG tablet; TAKE 1 TABLET BY MOUTH AT  ONSET OF MIGRAINE  Dispense: 9 tablet; Refill: 0  2. IBS (irritable bowel syndrome)  - nortriptyline (PAMELOR) 10 MG capsule; Take 1 capsule (10 mg total) by mouth at bedtime.  Dispense: 30 capsule; Refill: 2  3. Anxiety, generalized  - ALPRAZolam (XANAX XR) 1 MG 24 hr tablet; Take 1 tablet (1 mg total) by mouth daily. Fill 08/08/2016  Dispense: 30 tablet; Refill: 2 - Ambulatory referral to Psychology - clonazePAM (KLONOPIN) 0.5 MG tablet; Take 1 tablet (0.5 mg total) by mouth daily as needed for anxiety. Must last 90 days At most one daily  Dispense: 30 tablet; Refill: 0  4. Morbid obesity, unspecified obesity type [facility])  Discussed with the patient the risk posed by an increased BMI. Discussed importance of portion control, calorie counting and at least 150 minutes of physical activity weekly. Avoid sweet beverages and drink more water. Eat at least 6 servings of fruit and vegetables daily   5. Dysmetabolic syndrome  Continue life style modification   6. Tension headache  Discussed exercise, and hopefully correcting her sleep it will help with daily headaches   7. Needs flu shot  - Flu Vaccine QUAD 36+ mos IM

## 2016-08-05 ENCOUNTER — Ambulatory Visit: Payer: 59 | Admitting: Family Medicine

## 2016-08-05 ENCOUNTER — Telehealth: Payer: Self-pay | Admitting: Family Medicine

## 2016-08-06 NOTE — Telephone Encounter (Signed)
Patient notified

## 2016-08-06 NOTE — Telephone Encounter (Signed)
Both together

## 2016-08-11 ENCOUNTER — Encounter (HOSPITAL_COMMUNITY): Payer: Self-pay

## 2016-08-11 ENCOUNTER — Ambulatory Visit: Payer: 59 | Admitting: Family Medicine

## 2016-08-11 NOTE — Progress Notes (Signed)
noted 

## 2016-08-20 ENCOUNTER — Ambulatory Visit (INDEPENDENT_AMBULATORY_CARE_PROVIDER_SITE_OTHER): Payer: 59 | Admitting: Psychiatry

## 2016-08-20 ENCOUNTER — Ambulatory Visit: Payer: Self-pay | Admitting: Psychiatry

## 2016-08-20 ENCOUNTER — Encounter: Payer: Self-pay | Admitting: Psychiatry

## 2016-08-20 VITALS — BP 121/82 | HR 83 | Temp 98.2°F | Ht 64.0 in | Wt 238.2 lb

## 2016-08-20 DIAGNOSIS — F411 Generalized anxiety disorder: Secondary | ICD-10-CM

## 2016-08-20 DIAGNOSIS — F39 Unspecified mood [affective] disorder: Secondary | ICD-10-CM | POA: Diagnosis not present

## 2016-08-20 DIAGNOSIS — F4010 Social phobia, unspecified: Secondary | ICD-10-CM

## 2016-08-20 MED ORDER — QUETIAPINE FUMARATE 25 MG PO TABS: 25.0000 mg | ORAL_TABLET | Freq: Every day | ORAL | 0 refills | Status: DC

## 2016-08-20 MED ORDER — CLONAZEPAM 0.5 MG PO TABS: 0.5000 mg | ORAL_TABLET | Freq: Every day | ORAL | 0 refills | Status: DC | PRN

## 2016-08-20 MED ORDER — ESCITALOPRAM OXALATE 10 MG PO TABS: 15.0000 mg | ORAL_TABLET | Freq: Every day | ORAL | 0 refills | Status: DC

## 2016-08-20 MED ORDER — PROPRANOLOL HCL 10 MG PO TABS: 10.0000 mg | ORAL_TABLET | Freq: Two times a day (BID) | ORAL | 0 refills | Status: DC

## 2016-08-20 NOTE — Progress Notes (Signed)
Psychiatric Initial Adult Assessment   Patient Identification: Sally Mueller MRN:  [id number] Date of Evaluation:  08/20/2016 Referral Source: Steele Sizer, M.D Chief Complaint:   Chief Complaint    Establish Care; Anxiety     Visit Diagnosis:    ICD-9-CM ICD-10-CM   1. Social anxiety disorder 300.23 F40.10   2. Anxiety, generalized 300.02 F41.1 clonazePAM (KLONOPIN) 0.5 MG tablet     escitalopram (LEXAPRO) 10 MG tablet  3. Episodic mood disorder (HCC) 296.90 F39     History of Present Illness:    Patient is a 40 year old female who presented for initial assessment. She was referred by her primary care physician. Patient reported that she has long history of anxiety as far as she remembered. She reported that she has started seeing her primary care physician Dr. Faustino Congress has given her several different medications. Currently she is taking alprazolam ask our 1 mg in the morning as well as Klonopin on a when necessary basis. She was also recently started on Lexapro and Seroquel. She is able to be present with the help of Seroquel. Patient reported that she has severe anxiety. He is unable to decide what she has to order for dinner and then she was started having racing thoughts and agitation and anger as she will be mad at herself that why she cannot decide. Patient reported that she also gets flushed when she is is presenting or doing public speaking. She reported that she is very outgoing person and has lot of self confidence. She reported that she does not want to feel anxious throughout the day. She is currently pre-K teacher and has been motivated enough to complete her bachelor's degree recently. She reported that the anxiety is causing her relational problems with her partner. She wants her medications to be adjusted at this time. She denied using any drugs or alcohol. She is currently living with her teenage children and her partner at this time.    Associated Signs/Symptoms: Depression  Symptoms:  depressed mood, psychomotor agitation, fatigue, difficulty concentrating, anxiety, panic attacks, loss of energy/fatigue, (Hypo) Manic Symptoms:  Flight of Ideas, Impulsivity, Labiality of Mood, Anxiety Symptoms:  Excessive Worry, Psychotic Symptoms:  none PTSD Symptoms: Negative NA  Past Psychiatric History:  She was recently started on medication by her primary care physician. She denied any previous history of suicide attempts she denied any previous history of mental illness.  Previous Psychotropic Medications: Lexapro Seroquel Alprazolam Klonopin  Substance Abuse History in the last 12 months:  No.  Consequences of Substance Abuse: Negative NA  Past Medical History:  Past Medical History:  Diagnosis Date  . Anxiety   . Headache   . Metabolic syndrome   . Migraine   . Migraine without aura, with intractable migraine, so stated, with status migrainosus   . Obesity   . Seborrhea     Past Surgical History:  Procedure Laterality Date  . CESAREAN SECTION  2000, 2002  . CHOLECYSTECTOMY  2008?  [name] LIPOMA EXCISION  05/01/2014  . TUBAL LIGATION  2002  . TUMOR REMOVAL  2012?    Family Psychiatric History:  Mother's side- psychiatric illness.  Sister- schizophrenia All sibling have addiction on maternal side.    Family History:  Family History  Problem Relation Age of Onset  . Cardiomyopathy Mother   . Hypertension Mother   . Drug abuse Mother   . Hyperlipidemia Father   . Alcohol abuse Sister   . Drug abuse Sister   . Bipolar disorder Sister   .  Schizophrenia Sister     Social History:   Social History   Social History  . Marital status: Single    Spouse name: N/A  . Number of children: 3  . Years of education: N/A   Social History Main Topics  . Smoking status: Former Smoker    Years: 8.00    Types: Cigarettes    Start date: 11/17/1993    Quit date: 11/17/2001  . Smokeless tobacco: Never Used  . Alcohol use 0.0 oz/week      Comment: occasionally-socially  . Drug use: No  . Sexual activity: Yes    Partners: Male    Birth control/ protection: None   Other Topics Concern  . None   Social History Narrative  . None    Additional Social History:  Living with domestic partner x 16 years. Has a child with him. Has 3 sons.  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  Just Graduated from [employer] and completed Bachelors Degree.   Allergies:   Allergies  Allergen Reactions  . Sumatriptan     crawling sensation in her body, flushed    Metabolic Disorder Labs: Lab Results  Component Value Date   HGBA1C 5.3 05/09/2016   MPG 105 05/09/2016   No results found for: PROLACTIN Lab Results  Component Value Date   CHOL 174 05/09/2016   TRIG 124 05/09/2016   HDL 40 (L) 05/09/2016   CHOLHDL 4.4 05/09/2016   VLDL 25 05/09/2016   LDLCALC 109 05/09/2016     Current Medications: Current Outpatient Prescriptions  Medication Sig Dispense Refill  . almotriptan (AXERT) 12.5 MG tablet TAKE 1 TABLET BY MOUTH AT ONSET OF MIGRAINE 9 tablet 0  . clonazePAM (KLONOPIN) 0.5 MG tablet Take 1 tablet (0.5 mg total) by mouth daily as needed for anxiety. 1/2- 1 pill daily- pt has supply 30 tablet 0  . escitalopram (LEXAPRO) 10 MG tablet Take 1.5 tablets (15 mg total) by mouth daily. 45 tablet 0  . nortriptyline (PAMELOR) 10 MG capsule Take 1 capsule (10 mg total) by mouth at bedtime. 30 capsule 2  . promethazine (PHENERGAN) 25 MG tablet Take 1 tablet (25 mg total) by mouth every 8 (eight) hours as needed. 30 tablet 0  . QUEtiapine (SEROQUEL) 25 MG tablet Take 1 tablet (25 mg total) by mouth at bedtime. For sleep 30 tablet 0  . traMADol (ULTRAM) 50 MG tablet Take 2 tablets (100 mg total) by mouth 4 (four) times daily. 60 tablet 0  . propranolol (INDERAL) 10 MG tablet Take 1 tablet (10 mg total) by mouth 2 (two) times daily. As needed 60 tablet 0   No current facility-administered medications for this visit.      Neurologic: Headache: Yes Seizure: No Paresthesias:No  Musculoskeletal: Strength & Muscle Tone: within normal limits Gait & Station: normal Patient leans: N/A  Psychiatric Specialty Exam: Review of Systems  Psychiatric/Behavioral: Positive for memory loss. The patient is nervous/anxious and has insomnia.   All other systems reviewed and are negative.   Blood pressure 121/82, pulse 83, temperature 98.2 F (36.8 C), temperature source Oral, height 5\' 4"  (1.626 m), weight 238 lb 3.2 oz (108 kg), last menstrual period 08/11/2016.Body mass index is 40.89 kg/m.  General Appearance: Casual and Fairly Groomed  Eye Contact:  Fair  Speech:  Pressured  Volume:  Normal  Mood:  Anxious  Affect:  Congruent  Thought Process:  Linear  Orientation:  Full (Time, Place, and Person)  Thought  Content:  Logical  Suicidal Thoughts:  No  Homicidal Thoughts:  No  Memory:  Immediate;   Fair Recent;   Fair Remote;   Fair  Judgement:  Intact  Insight:  Fair  Psychomotor Activity:  Normal  Concentration:  Concentration: Fair and Attention Span: Fair  Recall:  [employer] of Knowledge:Fair  Language: Fair  Akathisia:  No  Handed:  Right  AIMS (if indicated):    Assets:  Communication Skills Desire for Improvement Physical Health Social Support  ADL's:  Intact  Cognition: WNL  Sleep:  good    Treatment Plan Summary: Medication management   Patient reported that she has been sleeping well with the help of the Seroquel at this time. She does not want to change the medication. I will start her on Lexapro 15 mg in the morning. Advised her to discontinue taking alprazolam in the morning as it will cause her memory problems and she agreed with the plan. She will continue on Klonopin 0.25 mg in the morning for the next 2 weeks and then will take it on a when necessary basis. Advised her to start taking propranolol 10 mg at lunchtime daily. She was also given a prescription another pill of   10 mg on a when necessary basis for anxiety Patient was instructed to check her blood pressure on a when necessary basis and she agreed with the plan. She will follow-up in 2-3 weeks  She was given written instructions about her medications   More than 50% of the time spent in psychoeducation, counseling and coordination of care.    This note was generated in part or whole with voice recognition software. Voice regonition is usually quite accurate but there are transcription errors that can and very often do occur. I apologize for any typographical errors that were not detected and corrected.    Rainey Pines, MD 10/4/20173:25 PM

## 2016-08-25 ENCOUNTER — Telehealth: Payer: Self-pay | Admitting: Family Medicine

## 2016-08-25 ENCOUNTER — Telehealth: Payer: Self-pay

## 2016-08-25 NOTE — Telephone Encounter (Signed)
She needs to call the provider who changed the medication first. Thank you

## 2016-08-25 NOTE — Telephone Encounter (Signed)
Spoke with pt . She is taking Klonopin 0.5mg  1.5 mg qam and Lexapro 10mg  po qam, Seroquel 25mg  po qhs  She was advised to titrate Klonopin 0.5 mg po TID She will follow up on Wednesday  Will refill lexapro at this time.

## 2016-08-25 NOTE — Telephone Encounter (Signed)
Call the other physician and they stated to increase the medication since she took away the [facility]. Until her body gets adjusted to no xanax, since she is on Prozac in the am and Pamelor in the evening. Patient will see other physician on Wednesday to follow up and was told totake Clonazepam prn.

## 2016-08-25 NOTE — Telephone Encounter (Signed)
pt called states that she seen you friday she did not do well over the weekend.  she is nausea, headaches, feels shakey inside, light headed, dizzy, racing heart.  pt would like for doctor to call.

## 2016-08-25 NOTE — Telephone Encounter (Signed)
Patient was seen by physcologist on last Wednesday and she took her totally of xanex and put her on a new medication. On Friday afternoon patient developed migraine, nausea, shakey, racing heart, she gets hot then cold, light headed. Not sure if she is suppose to take her nausea medication aftaid that it will make things worse. She is requesting a return call to discuss this. I did ask if she contacted the doctor that changed the medication and she stated that she had not.

## 2016-08-27 ENCOUNTER — Ambulatory Visit (INDEPENDENT_AMBULATORY_CARE_PROVIDER_SITE_OTHER): Payer: 59 | Admitting: Psychiatry

## 2016-08-27 VITALS — BP 122/78 | HR 82 | Ht 63.25 in | Wt 237.6 lb

## 2016-08-27 DIAGNOSIS — F411 Generalized anxiety disorder: Secondary | ICD-10-CM

## 2016-08-27 DIAGNOSIS — F39 Unspecified mood [affective] disorder: Secondary | ICD-10-CM

## 2016-08-27 DIAGNOSIS — F4010 Social phobia, unspecified: Secondary | ICD-10-CM | POA: Diagnosis not present

## 2016-08-27 MED ORDER — ESCITALOPRAM OXALATE 10 MG PO TABS: 10.0000 mg | ORAL_TABLET | Freq: Every day | ORAL | 0 refills | Status: DC

## 2016-08-27 MED ORDER — CLONAZEPAM 0.5 MG PO TABS: ORAL_TABLET | ORAL | 0 refills | Status: DC

## 2016-08-27 NOTE — Progress Notes (Signed)
Psychiatric MD Progress Note   Patient Identification: Sally Mueller MRN:  [id number] Date of Evaluation:  08/27/2016 Referral Source: Steele Sizer, M.D Chief Complaint:    Visit Diagnosis:    ICD-9-CM ICD-10-CM   1. Social anxiety disorder 300.23 F40.10   2. 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 She was referred by her primary care physician. Patient appeared much calmer and alert during the interview. She reported that her anxiety has started improving as the medications were adjusted. She has been taking Klonopin and Lexapro in the morning. She also takes Klonopin and propranolol at lunchtime and Klonopin and nortriptyline at bedtime. Patient reported that she feels that her symptoms are improving. She stated that she is not having any nervousness and withdrawal symptoms. She reported that she has some social anxiety related to going out in public by herself.   She was discussing in detail that she went to [employer] when she was preparing for dinner and did the grocery by herself quickly. She appeared calm and alert during the interview. We discussed about her medications and will start gradually decreasing the dose of the Klonopin at this time. Patient agreed with the plan.  She wants her medications to be adjusted at this time. She denied using any drugs or alcohol. She is currently living with her teenage children and her partner at this time.    Associated Signs/Symptoms: Depression Symptoms:  depressed mood, psychomotor agitation, fatigue, difficulty concentrating, anxiety, panic attacks, loss of energy/fatigue, (Hypo) Manic Symptoms:  Flight of Ideas, Impulsivity, Labiality of Mood, Anxiety Symptoms:  Excessive Worry, Psychotic Symptoms:  none PTSD Symptoms: Negative NA  Past Psychiatric History:  She was recently started on medication by her primary care physician.  She denied any previous history of suicide attempts she denied any previous history of mental illness.  Previous Psychotropic Medications: Lexapro Seroquel Alprazolam Klonopin  Substance Abuse History in the last 12 months:  No.  Consequences of Substance Abuse: Negative NA  Past Medical History:  Past Medical History:  Diagnosis Date  . Anxiety   . Headache   . Metabolic syndrome   . Migraine   . Migraine without aura, with intractable migraine, so stated, with status migrainosus   . Obesity   . Seborrhea     Past Surgical History:  Procedure Laterality Date  . CESAREAN SECTION  2000, 2002  . CHOLECYSTECTOMY  2008?  [name] LIPOMA EXCISION  05/01/2014  . TUBAL LIGATION  2002  . TUMOR REMOVAL  2012?    Family Psychiatric History:  Mother's side- psychiatric illness.  Sister- schizophrenia All sibling have addiction on maternal side.    Family History:  Family History  Problem Relation Age of Onset  . Cardiomyopathy Mother   . Hypertension Mother   . Drug abuse Mother   . Hyperlipidemia Father   . Alcohol abuse Sister   . Drug abuse Sister   . Bipolar disorder Sister   . Schizophrenia Sister     Social History:   Social History   Social History  . Marital status: Single    Spouse name: N/A  . Number of children: 3  . Years of education: N/A   Social History Main Topics  . Smoking status: Former Smoker    Years: 8.00    Types: Cigarettes    Start date: 11/17/1993    Quit date: 11/17/2001  .  Smokeless tobacco: Never Used  . Alcohol use 0.0 oz/week     Comment: occasionally-socially  . Drug use: No  . Sexual activity: Yes    Partners: Male    Birth control/ protection: None   Other Topics Concern  . Not on file   Social History Narrative  . No narrative on file    Additional Social History:  Living with domestic partner x 16 years. Has a child with him. Has 3 sons.  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  Just Graduated from [employer] and  completed Bachelors Degree.   Allergies:   Allergies  Allergen Reactions  . Sumatriptan     crawling sensation in her body, flushed    Metabolic Disorder Labs: Lab Results  Component Value Date   HGBA1C 5.3 05/09/2016   MPG 105 05/09/2016   No results found for: PROLACTIN Lab Results  Component Value Date   CHOL 174 05/09/2016   TRIG 124 05/09/2016   HDL 40 (L) 05/09/2016   CHOLHDL 4.4 05/09/2016   VLDL 25 05/09/2016   LDLCALC 109 05/09/2016     Current Medications: Current Outpatient Prescriptions  Medication Sig Dispense Refill  . almotriptan (AXERT) 12.5 MG tablet TAKE 1 TABLET BY MOUTH AT ONSET OF MIGRAINE 9 tablet 0  . clonazePAM (KLONOPIN) 0.5 MG tablet Take 1 tablet (0.5 mg total) by mouth daily as needed for anxiety. 1/2- 1 pill daily- pt has supply 30 tablet 0  . escitalopram (LEXAPRO) 10 MG tablet Take 1.5 tablets (15 mg total) by mouth daily. 45 tablet 0  . nortriptyline (PAMELOR) 10 MG capsule Take 1 capsule (10 mg total) by mouth at bedtime. 30 capsule 2  . promethazine (PHENERGAN) 25 MG tablet Take 1 tablet (25 mg total) by mouth every 8 (eight) hours as needed. 30 tablet 0  . propranolol (INDERAL) 10 MG tablet Take 1 tablet (10 mg total) by mouth 2 (two) times daily. As needed 60 tablet 0  . QUEtiapine (SEROQUEL) 25 MG tablet Take 1 tablet (25 mg total) by mouth at bedtime. For sleep 30 tablet 0  . traMADol (ULTRAM) 50 MG tablet Take 2 tablets (100 mg total) by mouth 4 (four) times daily. 60 tablet 0   No current facility-administered medications for this visit.     Neurologic: Headache: Yes Seizure: No Paresthesias:No  Musculoskeletal: Strength & Muscle Tone: within normal limits Gait & Station: normal Patient leans: N/A  Psychiatric Specialty Exam: Review of Systems  Psychiatric/Behavioral: Positive for memory loss. The patient is nervous/anxious and has insomnia.   All other systems reviewed and are negative.   Blood pressure 122/78, pulse  82, height 5' 3.25" (1.607 m), weight 237 lb 9.6 oz (107.8 kg), last menstrual period 08/11/2016.Body mass index is 41.76 kg/m.  General Appearance: Casual and Fairly Groomed  Eye Contact:  Fair  Speech:  Pressured  Volume:  Normal  Mood:  Anxious  Affect:  Congruent  Thought Process:  Linear  Orientation:  Full (Time, Place, and Person)  Thought Content:  Logical  Suicidal Thoughts:  No  Homicidal Thoughts:  No  Memory:  Immediate;   Fair Recent;   Fair Remote;   Fair  Judgement:  Intact  Insight:  Fair  Psychomotor Activity:  Normal  Concentration:  Concentration: Fair and Attention Span: Fair  Recall:  [employer] of Knowledge:Fair  Language: Fair  Akathisia:  No  Handed:  Right  AIMS (if indicated):    Assets:  Communication Skills Desire for Improvement Physical Health Social Support  ADL's:  Intact  Cognition: WNL  Sleep:  good    Treatment Plan Summary: Medication management   Change Klonopin 0.25 mg by mouth every morning and every noon  and 0.5 mg by mouth daily at bedtime - patient has supply Continue Lexapro 10 mg daily Continue propranolol 10 mg at lunchtime Continue nortriptyline at bedtime Continue  Seroquel at bedtime  She will follow-up in 2-3 weeks.  She was given written instructions about her medications   More than 50% of the time spent in psychoeducation, counseling and coordination of care.    This note was generated in part or whole with voice recognition software. Voice regonition is usually quite accurate but there are transcription errors that can and very often do occur. I apologize for any typographical errors that were not detected and corrected.    Rainey Pines, MD 10/11/20171:39 PM

## 2016-08-31 ENCOUNTER — Other Ambulatory Visit: Payer: Self-pay | Admitting: Family Medicine

## 2016-09-12 ENCOUNTER — Other Ambulatory Visit: Payer: Self-pay | Admitting: Psychiatry

## 2016-09-12 DIAGNOSIS — F411 Generalized anxiety disorder: Secondary | ICD-10-CM

## 2016-09-15 ENCOUNTER — Telehealth: Payer: Self-pay

## 2016-09-15 NOTE — Telephone Encounter (Signed)
received a fax requesting a refill on propranolol 10mg 

## 2016-09-15 NOTE — Telephone Encounter (Signed)
left message on doctor's line that it was ok to refill propranolol 10mg  take 1 tablet by mouth twice daily as needed.  #60 no additional refills.

## 2016-09-24 ENCOUNTER — Ambulatory Visit: Payer: 59 | Admitting: Psychiatry

## 2016-10-03 ENCOUNTER — Encounter: Payer: Self-pay | Admitting: Family Medicine

## 2016-10-03 ENCOUNTER — Ambulatory Visit (INDEPENDENT_AMBULATORY_CARE_PROVIDER_SITE_OTHER): Payer: 59 | Admitting: Family Medicine

## 2016-10-03 VITALS — BP 116/62 | HR 97 | Temp 98.2°F | Resp 16 | Ht 63.25 in | Wt 235.9 lb

## 2016-10-03 DIAGNOSIS — N393 Stress incontinence (female) (male): Secondary | ICD-10-CM | POA: Diagnosis not present

## 2016-10-03 DIAGNOSIS — Z124 Encounter for screening for malignant neoplasm of cervix: Secondary | ICD-10-CM | POA: Diagnosis not present

## 2016-10-03 DIAGNOSIS — Z01419 Encounter for gynecological examination (general) (routine) without abnormal findings: Secondary | ICD-10-CM

## 2016-10-03 DIAGNOSIS — Z1239 Encounter for other screening for malignant neoplasm of breast: Secondary | ICD-10-CM

## 2016-10-03 DIAGNOSIS — Z1231 Encounter for screening mammogram for malignant neoplasm of breast: Secondary | ICD-10-CM

## 2016-10-03 NOTE — Progress Notes (Signed)
Name: Sally Mueller   MRN: [id number]    DOB: [birth year]   Date:10/03/2016       Progress Note  Subjective  Chief Complaint  Chief Complaint  Patient presents with  . Annual Exam    HPI  Well Woman: she is due for cervical cancer screen and mammogram. She denies pain during intercourse or vaginal discharge. No breast lumps.   Patient Active Problem List   Diagnosis Date Noted  . Posterior neck pain 08/30/2015  . IBS (irritable bowel syndrome) 07/12/2015  . Anxiety, generalized 05/17/2015  . Tension headache 05/17/2015  . Dysmenorrhea 05/17/2015  . Dysmetabolic syndrome [id number]  . Migraine with aura and without status migrainosus 05/17/2015  . Extreme obesity ([place]) 05/17/2015    Past Surgical History:  Procedure Laterality Date  . CESAREAN SECTION  2000, 2002  . CHOLECYSTECTOMY  2008?  [name] LIPOMA EXCISION  05/01/2014  . TUBAL LIGATION  2002  . TUMOR REMOVAL  2012?    Family History  Problem Relation Age of Onset  . Cardiomyopathy Mother   . Hypertension Mother   . Drug abuse Mother   . Hyperlipidemia Father   . Alcohol abuse Sister   . Drug abuse Sister   . Bipolar disorder Sister   . Schizophrenia Sister     Social History   Social History  . Marital status: Single    Spouse name: N/A  . Number of children: 3  . Years of education: N/A   Occupational History  . Not on file.   Social History Main Topics  . Smoking status: Former Smoker    Years: 8.00    Types: Cigarettes    Start date: 11/17/1993    Quit date: 11/17/2001  . Smokeless tobacco: Never Used  . Alcohol use No  . Drug use: No  . Sexual activity: Yes    Partners: Male    Birth control/ protection: Other-see comments     Comment: Tubal Ligation   Other Topics Concern  . Not on file   Social History Narrative  . No narrative on file     Current Outpatient Prescriptions:  .  almotriptan (AXERT) 12.5 MG tablet, TAKE 1 TABLET BY MOUTH AT ONSET OF MIGRAINE, Disp: 9 tablet, Rfl: 0 .   clonazePAM (KLONOPIN) 0.5 MG tablet, 0.25 qam, qnoon and 0.5 qhs, Disp: 30 tablet, Rfl: 0 .  escitalopram (LEXAPRO) 10 MG tablet, Take 1 tablet (10 mg total) by mouth daily. Pt has supply, Disp: 30 tablet, Rfl: 0 .  escitalopram (LEXAPRO) 10 MG tablet, TAKE ONE AND ONE-HALF TABLETS BY MOUTH EVERY DAY, Disp: 45 tablet, Rfl: 0 .  nortriptyline (PAMELOR) 10 MG capsule, Take 1 capsule (10 mg total) by mouth at bedtime., Disp: 30 capsule, Rfl: 2 .  promethazine (PHENERGAN) 25 MG tablet, Take 1 tablet (25 mg total) by mouth every 8 (eight) hours as needed., Disp: 30 tablet, Rfl: 0 .  propranolol (INDERAL) 10 MG tablet, Take 1 tablet (10 mg total) by mouth 2 (two) times daily. As needed, Disp: 60 tablet, Rfl: 0 .  QUEtiapine (SEROQUEL) 25 MG tablet, Take 1 tablet (25 mg total) by mouth at bedtime. For sleep, Disp: 30 tablet, Rfl: 0 .  traMADol (ULTRAM) 50 MG tablet, Take 2 tablets (100 mg total) by mouth 4 (four) times daily., Disp: 60 tablet, Rfl: 0  Allergies  Allergen Reactions  . Sumatriptan     crawling sensation in her body, flushed     ROS  Constitutional: Negative for  fever or weight change.  Respiratory: Negative for cough and shortness of breath.   Cardiovascular: Negative for chest pain , positive for intermittent palpitations - not associated with SOB very brief, and she has anxiety.  Gastrointestinal: Negative for abdominal pain, no bowel changes.  Musculoskeletal: Negative for gait problem or joint swelling.  Skin: Negative for rash.  Neurological: Negative for dizziness, positive chronic  headache.  No other specific complaints in a complete review of systems (except as listed in HPI above).  Objective  Vitals:   10/03/16 1415  BP: 116/62  Pulse: 97  Resp: 16  Temp: 98.2 F (36.8 C)  TempSrc: Oral  SpO2: 96%  Weight: 235 lb 14.4 oz (107 kg)  Height: 5' 3.25" (1.607 m)    Body mass index is 41.46 kg/m.  Physical Exam  Constitutional: Patient appears  well-developed and well-nourished. No distress.  HENT: Head: Normocephalic and atraumatic. Ears: B TMs ok, no erythema or effusion; Nose: Nose normal. Mouth/Throat: Oropharynx is clear and moist. No oropharyngeal exudate.  Eyes: Conjunctivae and EOM are normal. Pupils are equal, round, and reactive to light. No scleral icterus.  Neck: Normal range of motion. Neck supple. No JVD present. No thyromegaly present.  Cardiovascular: Normal rate, regular rhythm and normal heart sounds.  No murmur heard. No BLE edema. Pulmonary/Chest: Effort normal and breath sounds normal. No respiratory distress. Abdominal: Soft. Bowel sounds are normal, no distension. There is no tenderness. no masses Breast: no lumps or masses, no nipple discharge or rashes FEMALE GENITALIA:  External genitalia normal External urethra normal Vaginal vault normal without discharge or lesions Cervix normal without discharge or lesions Bimanual exam normal without masses RECTAL: no rectal masses or hemorrhoids Musculoskeletal: Normal range of motion, no joint effusions. No gross deformities Neurological: he is alert and oriented to person, place, and time. No cranial nerve deficit. Coordination, balance, strength, speech and gait are normal.  Skin: Skin is warm and dry. No rash noted. No erythema.  Psychiatric: Patient has a normal mood and affect. behavior is normal. Judgment and thought content normal.  PHQ2/9: Depression screen [facility] 2/9 08/04/2016 05/09/2016 02/12/2016 08/30/2015 05/17/2015  Decreased Interest 0 0 0 0 0  Down, Depressed, Hopeless 0 0 0 0 0  PHQ - 2 Score 0 0 0 0 0    Fall Risk: Fall Risk  08/04/2016 05/09/2016 02/12/2016 08/30/2015 05/17/2015  Falls in the past year? No No No No No     Assessment & Plan  1. Well woman exam  Discussed importance of 150 minutes of physical activity weekly, eat two servings of fish weekly, eat one serving of tree nuts ( cashews, pistachios, pecans, almonds.[name]) every other day,  eat 6 servings of fruit/vegetables daily and drink plenty of water and avoid sweet beverages.   2. Cervical cancer screening  - PapLb, HPV, rfx16/18  3. Breast cancer screening  - MM Digital Screening; Future  4. Stress incontinence  Discussed kegel exercises

## 2016-10-06 ENCOUNTER — Telehealth: Payer: Self-pay

## 2016-10-06 ENCOUNTER — Other Ambulatory Visit: Payer: Self-pay | Admitting: Family Medicine

## 2016-10-06 DIAGNOSIS — F4010 Social phobia, unspecified: Secondary | ICD-10-CM

## 2016-10-06 MED ORDER — QUETIAPINE FUMARATE 25 MG PO TABS: 25.0000 mg | ORAL_TABLET | Freq: Every day | ORAL | 0 refills | Status: DC

## 2016-10-06 NOTE — Telephone Encounter (Signed)
Med refill x 1 month

## 2016-10-06 NOTE — Telephone Encounter (Signed)
A new Seroquel order e-scribed to patient's [facility] as approved by Dr. Gretel Acre for one month plus instruction evaluation required for further refills.

## 2016-10-06 NOTE — Telephone Encounter (Signed)
Medication refill request - Fax refill request for patient's Quetiapine received as patient's last orders from visit 08/27/16.  Patient canceled 09/24/16 and has not rescheduled yet due to short teachers that day.

## 2016-10-09 LAB — PLEASE NOTE

## 2016-10-09 LAB — PAPLB, HPV, RFX16/18
HPV, HIGH-RISK: NEGATIVE
PAP SMEAR COMMENT: 0

## 2016-10-15 ENCOUNTER — Other Ambulatory Visit: Payer: Self-pay | Admitting: Psychiatry

## 2016-10-15 DIAGNOSIS — F411 Generalized anxiety disorder: Secondary | ICD-10-CM

## 2016-10-16 ENCOUNTER — Telehealth: Payer: Self-pay | Admitting: *Deleted

## 2016-10-27 ENCOUNTER — Encounter: Payer: Self-pay | Admitting: Family Medicine

## 2016-10-27 ENCOUNTER — Ambulatory Visit (INDEPENDENT_AMBULATORY_CARE_PROVIDER_SITE_OTHER): Payer: 59 | Admitting: Family Medicine

## 2016-10-27 VITALS — BP 122/80 | HR 96 | Temp 98.6°F | Resp 18 | Ht 63.0 in | Wt 237.0 lb

## 2016-10-27 DIAGNOSIS — G43019 Migraine without aura, intractable, without status migrainosus: Secondary | ICD-10-CM | POA: Diagnosis not present

## 2016-10-27 DIAGNOSIS — K580 Irritable bowel syndrome with diarrhea: Secondary | ICD-10-CM | POA: Diagnosis not present

## 2016-10-27 DIAGNOSIS — F4010 Social phobia, unspecified: Secondary | ICD-10-CM | POA: Diagnosis not present

## 2016-10-27 DIAGNOSIS — E8881 Metabolic syndrome: Secondary | ICD-10-CM | POA: Diagnosis not present

## 2016-10-27 DIAGNOSIS — F411 Generalized anxiety disorder: Secondary | ICD-10-CM | POA: Diagnosis not present

## 2016-10-27 DIAGNOSIS — G43109 Migraine with aura, not intractable, without status migrainosus: Secondary | ICD-10-CM | POA: Diagnosis not present

## 2016-10-27 DIAGNOSIS — G44209 Tension-type headache, unspecified, not intractable: Secondary | ICD-10-CM

## 2016-10-27 DIAGNOSIS — M542 Cervicalgia: Secondary | ICD-10-CM

## 2016-10-27 MED ORDER — ESCITALOPRAM OXALATE 10 MG PO TABS: 15.0000 mg | ORAL_TABLET | Freq: Every day | ORAL | 2 refills | Status: DC

## 2016-10-27 MED ORDER — CLONAZEPAM 0.5 MG PO TABS: ORAL_TABLET | ORAL | 0 refills | Status: DC

## 2016-10-27 MED ORDER — PROPRANOLOL HCL 10 MG PO TABS: 10.0000 mg | ORAL_TABLET | Freq: Two times a day (BID) | ORAL | 2 refills | Status: DC

## 2016-10-27 MED ORDER — NORTRIPTYLINE HCL 10 MG PO CAPS: 10.0000 mg | ORAL_CAPSULE | Freq: Every day | ORAL | 2 refills | Status: DC

## 2016-10-27 MED ORDER — TRAMADOL HCL 50 MG PO TABS: 100.0000 mg | ORAL_TABLET | Freq: Four times a day (QID) | ORAL | 0 refills | Status: DC

## 2016-10-27 MED ORDER — ALMOTRIPTAN MALATE 12.5 MG PO TABS: ORAL_TABLET | ORAL | 0 refills | Status: DC

## 2016-10-27 MED ORDER — PROMETHAZINE HCL 25 MG PO TABS: 25.0000 mg | ORAL_TABLET | Freq: Three times a day (TID) | ORAL | 0 refills | Status: DC | PRN

## 2016-10-27 MED ORDER — PROPRANOLOL HCL 10 MG PO TABS: 10.0000 mg | ORAL_TABLET | Freq: Two times a day (BID) | ORAL | 2 refills | Status: DC | PRN

## 2016-10-27 MED ORDER — QUETIAPINE FUMARATE 25 MG PO TABS: 25.0000 mg | ORAL_TABLET | Freq: Every day | ORAL | 2 refills | Status: DC

## 2016-10-27 MED ORDER — BACLOFEN 20 MG PO TABS: 20.0000 mg | ORAL_TABLET | Freq: Three times a day (TID) | ORAL | 0 refills | Status: DC

## 2016-10-27 NOTE — Progress Notes (Signed)
Name: Sally Mueller   MRN: [id number]    DOB: 11/03/1976   Date:10/27/2016       Progress Note  Subjective  Chief Complaint  Chief Complaint  Patient presents with  . Medication Refill    3 month F/U  . Migraine    Improving but states she is having headaches and Neck Pain  . Neck Pain    Onset-1 month with headaches associated with it, feels like it started after sleeping at her sons house and slept wrong. When she lefts her right arm it pulls her neck and hurts and makes her nausea.   [name] Anxiety    Up and Down  . Irritable Bowel Syndrome    Horrible, everything she eats makes her stomach upset.    HPI  Migraine Headache: she is doing better, on Nortriptyline. Maxalt did not control her symptoms, she only responds to Axert. She has aura ( she has a sensation of burning / like her nose will bleed ) and migraine happens if she does not take medication for it, pain  as sharp/piercing behind left eye, severe, associated with nausea, phonophobia and photophobia and dizziness. She only had two episodes of migraines in the past few months. .She still has some tension headaches and takes Tramadol prn for that. Usually takes 2 at once and it prevents from the tension headache into going to full blown headache.   GAD:  She finished school August 2017, graduated Dec 2017. She thought she would less stressed but is still feeling overwhelmed. Stopped Xanax XR advised by Dr. Maricela Curet and was given Klonopin, however to take only prn. She states Lexapro does not seem to control all her symptoms. She is on 15 mg daily, and denies side effects. She was also advised to take Inderal to take once a day and one extra pill to take prn. She states it is too expensive to see psychiatrist and would like to resume follow ups with me. We will refill Klonopin but only 10 pills to last until next visit.   IBS: she changed her diet since last visit and Nortriptyline is helping with abdominal pain/cramping. She has  intermittent episodes of constipation and diarrhea. Triggered by certain types of food  Obesity: she has changed her diet, but weight has not changed  Neck pain/tension headaches: she slept at her son' house about one month ago and woke up with a neck spasm, she states not improving, worse when rotating head to left side, and also when she first wakes up, causing her frontal head to hurt, but is not migraine like.   Metabolic syndrome: she denies polyphagia, polydipsia or polyuria  Patient Active Problem List   Diagnosis Date Noted  . Posterior neck pain 08/30/2015  . IBS (irritable bowel syndrome) 07/12/2015  . Anxiety, generalized 05/17/2015  . Tension headache 05/17/2015  . Dysmenorrhea 05/17/2015  . Dysmetabolic syndrome 99991111  . Migraine with aura and without status migrainosus 05/17/2015  . Extreme obesity ([place]) 05/17/2015    Past Surgical History:  Procedure Laterality Date  . CESAREAN SECTION  2000, 2002  . CHOLECYSTECTOMY  2008?  [name] LIPOMA EXCISION  05/01/2014  . TUBAL LIGATION  2002  . TUMOR REMOVAL  2012?    Family History  Problem Relation Age of Onset  . Cardiomyopathy Mother   . Hypertension Mother   . Drug abuse Mother   . Hyperlipidemia Father   . Alcohol abuse Sister   . Drug abuse Sister   . Bipolar  disorder Sister   . Schizophrenia Sister     Social History   Social History  . Marital status: Single    Spouse name: N/A  . Number of children: 3  . Years of education: N/A   Occupational History  . Not on file.   Social History Main Topics  . Smoking status: Former Smoker    Years: 8.00    Types: Cigarettes    Start date: 11/17/1993    Quit date: 11/17/2001  . Smokeless tobacco: Never Used  . Alcohol use No  . Drug use: No  . Sexual activity: Yes    Partners: Male    Birth control/ protection: Other-see comments     Comment: Tubal Ligation   Other Topics Concern  . Not on file   Social History Narrative  . No narrative on file      Current Outpatient Prescriptions:  .  almotriptan (AXERT) 12.5 MG tablet, TAKE 1 TABLET BY MOUTH AT ONSET OF MIGRAINE, Disp: 9 tablet, Rfl: 0 .  clonazePAM (KLONOPIN) 0.5 MG tablet, 0.25 qam, qnoon and 0.5 qhs, Disp: 10 tablet, Rfl: 0 .  escitalopram (LEXAPRO) 10 MG tablet, Take 1.5 tablets (15 mg total) by mouth daily., Disp: 45 tablet, Rfl: 2 .  nortriptyline (PAMELOR) 10 MG capsule, Take 1 capsule (10 mg total) by mouth at bedtime., Disp: 30 capsule, Rfl: 2 .  promethazine (PHENERGAN) 25 MG tablet, Take 1 tablet (25 mg total) by mouth every 8 (eight) hours as needed., Disp: 30 tablet, Rfl: 0 .  propranolol (INDERAL) 10 MG tablet, Take 1 tablet (10 mg total) by mouth 2 (two) times daily. As needed, Disp: 60 tablet, Rfl: 2 .  QUEtiapine (SEROQUEL) 25 MG tablet, Take 1 tablet (25 mg total) by mouth at bedtime. For sleep, Disp: 30 tablet, Rfl: 2 .  traMADol (ULTRAM) 50 MG tablet, Take 2 tablets (100 mg total) by mouth 4 (four) times daily., Disp: 60 tablet, Rfl: 0  Allergies  Allergen Reactions  . Sumatriptan     crawling sensation in her body, flushed     ROS  Constitutional: Negative for fever or weight change.  Respiratory: Negative for cough and shortness of breath.   Cardiovascular: Negative for chest pain or palpitations.  Gastrointestinal: Negative for abdominal pain, no bowel changes.  Musculoskeletal: Negative for gait problem or joint swelling.  Skin: Negative for rash.  Neurological: Negative for dizziness, positive  headache.  No other specific complaints in a complete review of systems (except as listed in HPI above).  Objective  Vitals:   10/27/16 0923  BP: 122/80  Pulse: 96  Resp: 18  Temp: 98.6 F (37 C)  TempSrc: Oral  SpO2: 96%  Weight: 237 lb (107.5 kg)  Height: 5\' 3"  (1.6 m)    Body mass index is 41.98 kg/m.  Physical Exam  Constitutional: Patient appears well-developed and well-nourished. Obese  No distress.  HEENT: head atraumatic,  normocephalic, pupils equal and reactive to light, neck pain during palpation and also with left lateral rotation , throat within normal limits Cardiovascular: Normal rate, regular rhythm and normal heart sounds.  No murmur heard. No BLE edema. Pulmonary/Chest: Effort normal and breath sounds normal. No respiratory distress. Abdominal: Soft.  There is no tenderness. Psychiatric: Patient has a normal mood and affect. behavior is normal. Judgment and thought content normal.  Recent Results (from the past 2160 hour(s))  PapLb, HPV, rfx16/18     Status: None   Collection Time: 10/06/16 12:00 AM  Result  Value Ref Range   DIAGNOSIS: Comment     Comment: NEGATIVE FOR INTRAEPITHELIAL LESION AND MALIGNANCY.   Specimen adequacy: Comment     Comment: Satisfactory for evaluation. Endocervical and/or squamous metaplastic cells (endocervical component) are present.    CLINICIAN PROVIDED ICD10: Comment     Comment: Z12.4   Performed by: Comment     Comment: Hope Monia Sabal, Cytotechnologist (ASCP)   PAP SMEAR COMMENT .    Note: Comment     Comment: The Pap smear is a screening test designed to aid in the detection of premalignant and malignant conditions of the uterine cervix.  It is not a diagnostic procedure and should not be used as the sole means of detecting cervical cancer.  Both false-positive and false-negative reports do occur.    HPV, high-risk Negative Negative    Comment: This high-risk HPV test detects thirteen high-risk types (16/18/31/33/35/39/45/51/52/56/58/59/68) without differentiation.   Please Note     Status: None   Collection Time: 10/06/16 12:00 AM  Result Value Ref Range   Please note Comment     Comment: The date and/or time of collection was not indicated on the requisition as required by state and federal law.  The date of receipt of the specimen was used as the collection date if not supplied.       PHQ2/9: Depression screen [facility] 2/9 10/27/2016 08/04/2016  05/09/2016 02/12/2016 08/30/2015  Decreased Interest 0 0 0 0 0  Down, Depressed, Hopeless 0 0 0 0 0  PHQ - 2 Score 0 0 0 0 0     Fall Risk: Fall Risk  10/27/2016 08/04/2016 05/09/2016 02/12/2016 08/30/2015  Falls in the past year? No No No No No    Functional Status Survey: Is the patient deaf or have difficulty hearing?: No Does the patient have difficulty seeing, even when wearing glasses/contacts?: No Does the patient have difficulty concentrating, remembering, or making decisions?: No Does the patient have difficulty walking or climbing stairs?: No Does the patient have difficulty dressing or bathing?: No Does the patient have difficulty doing errands alone such as visiting a doctor's office or shopping?: No   Assessment & Plan  1. Migraine with aura and without status migrainosus, not intractable  - traMADol (ULTRAM) 50 MG tablet; Take 2 tablets (100 mg total) by mouth 4 (four) times daily.  Dispense: 60 tablet; Refill: 0 - nortriptyline (PAMELOR) 10 MG capsule; Take 1 capsule (10 mg total) by mouth at bedtime.  Dispense: 30 capsule; Refill: 2 - almotriptan (AXERT) 12.5 MG tablet; TAKE 1 TABLET BY MOUTH AT ONSET OF MIGRAINE  Dispense: 9 tablet; Refill: 0  2. Anxiety, generalized  - escitalopram (LEXAPRO) 10 MG tablet; Take 1.5 tablets (15 mg total) by mouth daily.  Dispense: 45 tablet; Refill: 2 - propranolol (INDERAL) 10 MG tablet; Take 1 tablet (10 mg total) by mouth 2 (two) times daily. As needed  Dispense: 60 tablet; Refill: 2 - clonazePAM (KLONOPIN) 0.5 MG tablet; 0.25 qam, qnoon and 0.5 qhs  Dispense: 10 tablet; Refill: 0  3. Irritable bowel syndrome with diarrhea  - nortriptyline (PAMELOR) 10 MG capsule; Take 1 capsule (10 mg total) by mouth at bedtime.  Dispense: 30 capsule; Refill: 2  4. Morbid obesity, unspecified obesity type [facility])  Discussed with the patient the risk posed by an increased BMI. Discussed importance of portion control, calorie counting and at least  150 minutes of physical activity weekly. Avoid sweet beverages and drink more water. Eat at least 6 servings of  fruit and vegetables daily   5. Dysmetabolic syndrome  Continue life style modification   6. Social anxiety disorder  - QUEtiapine (SEROQUEL) 25 MG tablet; Take 1 tablet (25 mg total) by mouth at bedtime. For sleep  Dispense: 30 tablet; Refill: 2 - propranolol (INDERAL) 10 MG tablet; Take 1 tablet (10 mg total) by mouth 2 (two) times daily. As needed  Dispense: 60 tablet; Refill: 2  7. Migraine without aura, intractable  - promethazine (PHENERGAN) 25 MG tablet; Take 1 tablet (25 mg total) by mouth every 8 (eight) hours as needed.  Dispense: 30 tablet; Refill: 0  8. Tension headache  She states neck is tight and is causing a headache, discussed PT or chiropractor, and she is willing to try it.  - baclofen (LIORESAL) 20 MG tablet; Take 1 tablet (20 mg total) by mouth 3 (three) times daily.  Dispense: 30 each; Refill: 0  9. Neck pain  - baclofen (LIORESAL) 20 MG tablet; Take 1 tablet (20 mg total) by mouth 3 (three) times daily.  Dispense: 30 each; Refill: 0

## 2016-11-05 ENCOUNTER — Ambulatory Visit: Payer: 59 | Admitting: Psychiatry

## 2016-11-18 DIAGNOSIS — M542 Cervicalgia: Secondary | ICD-10-CM | POA: Diagnosis not present

## 2016-11-18 DIAGNOSIS — M9901 Segmental and somatic dysfunction of cervical region: Secondary | ICD-10-CM | POA: Diagnosis not present

## 2016-11-18 DIAGNOSIS — M9903 Segmental and somatic dysfunction of lumbar region: Secondary | ICD-10-CM | POA: Diagnosis not present

## 2016-11-20 DIAGNOSIS — M9901 Segmental and somatic dysfunction of cervical region: Secondary | ICD-10-CM | POA: Diagnosis not present

## 2016-11-20 DIAGNOSIS — M542 Cervicalgia: Secondary | ICD-10-CM | POA: Diagnosis not present

## 2016-11-20 DIAGNOSIS — M9903 Segmental and somatic dysfunction of lumbar region: Secondary | ICD-10-CM | POA: Diagnosis not present

## 2016-11-24 DIAGNOSIS — M542 Cervicalgia: Secondary | ICD-10-CM | POA: Diagnosis not present

## 2016-11-24 DIAGNOSIS — M9903 Segmental and somatic dysfunction of lumbar region: Secondary | ICD-10-CM | POA: Diagnosis not present

## 2016-11-24 DIAGNOSIS — M9901 Segmental and somatic dysfunction of cervical region: Secondary | ICD-10-CM | POA: Diagnosis not present

## 2016-11-27 DIAGNOSIS — M9903 Segmental and somatic dysfunction of lumbar region: Secondary | ICD-10-CM | POA: Diagnosis not present

## 2016-11-27 DIAGNOSIS — M542 Cervicalgia: Secondary | ICD-10-CM | POA: Diagnosis not present

## 2016-11-27 DIAGNOSIS — M9901 Segmental and somatic dysfunction of cervical region: Secondary | ICD-10-CM | POA: Diagnosis not present

## 2016-12-02 ENCOUNTER — Ambulatory Visit
Admission: RE | Admit: 2016-12-02 | Discharge: 2016-12-02 | Disposition: A | Discharge status: Home / Self Care | Payer: 59 | Source: Ambulatory Visit | Attending: Family Medicine | Admitting: Family Medicine

## 2016-12-02 DIAGNOSIS — Z1231 Encounter for screening mammogram for malignant neoplasm of breast: Secondary | ICD-10-CM | POA: Diagnosis not present

## 2016-12-04 DIAGNOSIS — M9901 Segmental and somatic dysfunction of cervical region: Secondary | ICD-10-CM | POA: Diagnosis not present

## 2016-12-04 DIAGNOSIS — M542 Cervicalgia: Secondary | ICD-10-CM | POA: Diagnosis not present

## 2016-12-04 DIAGNOSIS — M9903 Segmental and somatic dysfunction of lumbar region: Secondary | ICD-10-CM | POA: Diagnosis not present

## 2016-12-09 DIAGNOSIS — M9903 Segmental and somatic dysfunction of lumbar region: Secondary | ICD-10-CM | POA: Diagnosis not present

## 2016-12-09 DIAGNOSIS — M9901 Segmental and somatic dysfunction of cervical region: Secondary | ICD-10-CM | POA: Diagnosis not present

## 2016-12-09 DIAGNOSIS — M542 Cervicalgia: Secondary | ICD-10-CM | POA: Diagnosis not present

## 2017-01-03 ENCOUNTER — Other Ambulatory Visit: Payer: Self-pay | Admitting: Psychiatry

## 2017-01-03 DIAGNOSIS — F411 Generalized anxiety disorder: Secondary | ICD-10-CM

## 2017-01-24 ENCOUNTER — Encounter: Payer: Self-pay | Admitting: Family Medicine

## 2017-01-27 ENCOUNTER — Ambulatory Visit: Payer: Self-pay | Admitting: Family Medicine

## 2017-01-28 ENCOUNTER — Ambulatory Visit (INDEPENDENT_AMBULATORY_CARE_PROVIDER_SITE_OTHER): Payer: 59 | Admitting: Family Medicine

## 2017-01-28 ENCOUNTER — Encounter: Payer: Self-pay | Admitting: Family Medicine

## 2017-01-28 VITALS — BP 114/78 | HR 70 | Temp 98.2°F | Resp 16 | Wt 243.0 lb

## 2017-01-28 DIAGNOSIS — M542 Cervicalgia: Secondary | ICD-10-CM | POA: Diagnosis not present

## 2017-01-28 DIAGNOSIS — E8881 Metabolic syndrome: Secondary | ICD-10-CM

## 2017-01-28 DIAGNOSIS — G43109 Migraine with aura, not intractable, without status migrainosus: Secondary | ICD-10-CM

## 2017-01-28 DIAGNOSIS — G44209 Tension-type headache, unspecified, not intractable: Secondary | ICD-10-CM

## 2017-01-28 DIAGNOSIS — G43019 Migraine without aura, intractable, without status migrainosus: Secondary | ICD-10-CM | POA: Diagnosis not present

## 2017-01-28 DIAGNOSIS — K580 Irritable bowel syndrome with diarrhea: Secondary | ICD-10-CM

## 2017-01-28 DIAGNOSIS — F4010 Social phobia, unspecified: Secondary | ICD-10-CM

## 2017-01-28 DIAGNOSIS — F411 Generalized anxiety disorder: Secondary | ICD-10-CM | POA: Diagnosis not present

## 2017-01-28 MED ORDER — PROPRANOLOL HCL 10 MG PO TABS: 10.0000 mg | ORAL_TABLET | Freq: Two times a day (BID) | ORAL | 2 refills | Status: DC | PRN

## 2017-01-28 MED ORDER — ALMOTRIPTAN MALATE 12.5 MG PO TABS: ORAL_TABLET | ORAL | 0 refills | Status: DC

## 2017-01-28 MED ORDER — BACLOFEN 20 MG PO TABS: 20.0000 mg | ORAL_TABLET | Freq: Three times a day (TID) | ORAL | 0 refills | Status: DC

## 2017-01-28 MED ORDER — TRAMADOL HCL 50 MG PO TABS: 100.0000 mg | ORAL_TABLET | Freq: Four times a day (QID) | ORAL | 0 refills | Status: DC

## 2017-01-28 MED ORDER — PROMETHAZINE HCL 25 MG PO TABS: 25.0000 mg | ORAL_TABLET | Freq: Three times a day (TID) | ORAL | 0 refills | Status: DC | PRN

## 2017-01-28 MED ORDER — QUETIAPINE FUMARATE 25 MG PO TABS: 25.0000 mg | ORAL_TABLET | Freq: Every day | ORAL | 2 refills | Status: DC

## 2017-01-28 MED ORDER — CLONAZEPAM 0.5 MG PO TABS: ORAL_TABLET | ORAL | 0 refills | Status: DC

## 2017-01-28 MED ORDER — ESCITALOPRAM OXALATE 10 MG PO TABS: 15.0000 mg | ORAL_TABLET | Freq: Every day | ORAL | 2 refills | Status: DC

## 2017-01-28 MED ORDER — NORTRIPTYLINE HCL 10 MG PO CAPS: 10.0000 mg | ORAL_CAPSULE | Freq: Every day | ORAL | 2 refills | Status: DC

## 2017-01-28 NOTE — Progress Notes (Signed)
Name: Sally Mueller   MRN: [id number]    DOB: 1976-10-26   Date:01/28/2017       Progress Note  Subjective  Chief Complaint  Chief Complaint  Patient presents with  . Migraine    3 month follow up  . Anxiety  . Irritable Bowel Syndrome    HPI  Migraine Headache: she is doing better, on Nortriptyline. Maxalt did not control her symptoms, she only responds to Axert. She has aura ( she has a sensation of burning / like her nose will bleed ) and migraine happens if she does not take medication for it, pain as sharp/piercing behind left eye, severe, associated with nausea, phonophobia and photophobia and dizziness. She had a total of 5 episodes in the past 3 months, prior to each cycle and during a viral illness. [name] .She still has some tension headaches and takes Tramadol prn for that. Usually takes 2 at once and it prevents from the tension headache into going to full blown headache.   GAD:  She finished school August 2017, graduated Dec 2017. She thought she would less stressed but is still feeling overwhelmed. Stopped Xanax XR advised by Dr. Maricela Curet and was given Klonopin, however to take only prn. She states Lexapro does not seem to control all her symptoms. She is on 15 mg daily, and denies side effects. She was also advised to take Inderal to take once a day and one extra pill to take prn. She states it is too expensive to see psychiatrist and would like to resume follow ups with me.She takes Klonopin 10 pills for 3 months.   IBS: she changed her diet since last visit and Nortriptyline is helping with abdominal pain/cramping. She has intermittent episodes of constipation and diarrhea. Triggered by certain types of food  Obesity: she has changed her diet, but weight has not changed  Neck pain/tension headaches: she was taking  Baclofen prn, but has been going to chiropractor and is doing better.   Metabolic syndrome: she denies polyphagia, polydipsia or polyuria  Patient Active Problem  List   Diagnosis Date Noted  . Posterior neck pain 08/30/2015  . IBS (irritable bowel syndrome) 07/12/2015  . Anxiety, generalized 05/17/2015  . Tension headache 05/17/2015  . Dysmenorrhea 05/17/2015  . Dysmetabolic syndrome 23/55/7322  . Migraine with aura and without status migrainosus 05/17/2015  . Extreme obesity ([place]) 05/17/2015    Past Surgical History:  Procedure Laterality Date  . CESAREAN SECTION  2000, 2002  . CHOLECYSTECTOMY  2008?  [name] LIPOMA EXCISION  05/01/2014  . TUBAL LIGATION  2002  . TUMOR REMOVAL  2012?    Family History  Problem Relation Age of Onset  . Cardiomyopathy Mother   . Hypertension Mother   . Drug abuse Mother   . Hyperlipidemia Father   . Alcohol abuse Sister   . Drug abuse Sister   . Bipolar disorder Sister   . Schizophrenia Sister   . Breast cancer Neg Hx     Social History   Social History  . Marital status: Single    Spouse name: N/A  . Number of children: 3  . Years of education: N/A   Occupational History  . teacher    Social History Main Topics  . Smoking status: Former Smoker    Years: 8.00    Types: Cigarettes    Start date: 11/17/1993    Quit date: 11/17/2001  . Smokeless tobacco: Never Used  . Alcohol use No  . Drug use:  No  . Sexual activity: Yes    Partners: Male    Birth control/ protection: Other-see comments     Comment: Tubal Ligation   Other Topics Concern  . Not on file   Social History Narrative   She graduated from school of education      Current Outpatient Prescriptions:  .  almotriptan (AXERT) 12.5 MG tablet, TAKE 1 TABLET BY MOUTH AT ONSET OF MIGRAINE, Disp: 9 tablet, Rfl: 0 .  baclofen (LIORESAL) 20 MG tablet, Take 1 tablet (20 mg total) by mouth 3 (three) times daily., Disp: 30 each, Rfl: 0 .  clonazePAM (KLONOPIN) 0.5 MG tablet, 0.25 qam, qnoon and 0.5 qhs, Disp: 10 tablet, Rfl: 0 .  escitalopram (LEXAPRO) 10 MG tablet, Take 1.5 tablets (15 mg total) by mouth daily., Disp: 45 tablet, Rfl: 2 .   nortriptyline (PAMELOR) 10 MG capsule, Take 1 capsule (10 mg total) by mouth at bedtime., Disp: 30 capsule, Rfl: 2 .  promethazine (PHENERGAN) 25 MG tablet, Take 1 tablet (25 mg total) by mouth every 8 (eight) hours as needed., Disp: 30 tablet, Rfl: 0 .  propranolol (INDERAL) 10 MG tablet, Take 1 tablet (10 mg total) by mouth 2 (two) times daily as needed. Anxiety, Disp: 60 tablet, Rfl: 2 .  QUEtiapine (SEROQUEL) 25 MG tablet, Take 1 tablet (25 mg total) by mouth at bedtime. For sleep, Disp: 30 tablet, Rfl: 2 .  traMADol (ULTRAM) 50 MG tablet, Take 2 tablets (100 mg total) by mouth 4 (four) times daily., Disp: 60 tablet, Rfl: 0  Allergies  Allergen Reactions  . Sumatriptan     crawling sensation in her body, flushed     ROS  Constitutional: Negative for fever or weight change.  Respiratory: Negative for cough and shortness of breath.   Cardiovascular: Negative for chest pain or palpitations.  Gastrointestinal: Negative for abdominal pain, no bowel changes.  Musculoskeletal: Negative for gait problem or joint swelling.  Skin: Negative for rash.  Neurological: Negative for dizziness or headache.  No other specific complaints in a complete review of systems (except as listed in HPI above).  Objective  Vitals:   01/28/17 1526  BP: 114/78  Pulse: 70  Resp: 16  Temp: 98.2 F (36.8 C)  SpO2: 97%  Weight: 243 lb (110.2 kg)    Body mass index is 43.05 kg/m.  Physical Exam  Constitutional: Patient appears well-developed and well-nourished. Obese No distress.  HEENT: head atraumatic, normocephalic, pupils equal and reactive to light, neck supple, throat within normal limits Cardiovascular: Normal rate, regular rhythm and normal heart sounds.  No murmur heard. No BLE edema. Pulmonary/Chest: Effort normal and breath sounds normal. No respiratory distress. Abdominal: Soft.  There is no tenderness. Psychiatric: Patient has a normal mood and affect. behavior is normal. Judgment and  thought content normal.  PHQ2/9: Depression screen [facility] 2/9 10/27/2016 08/04/2016 05/09/2016 02/12/2016 08/30/2015  Decreased Interest 0 0 0 0 0  Down, Depressed, Hopeless 0 0 0 0 0  PHQ - 2 Score 0 0 0 0 0     Fall Risk: Fall Risk  10/27/2016 08/04/2016 05/09/2016 02/12/2016 08/30/2015  Falls in the past year? No No No No No    Functional Status Survey: Is the patient deaf or have difficulty hearing?: No Does the patient have difficulty seeing, even when wearing glasses/contacts?: No Does the patient have difficulty concentrating, remembering, or making decisions?: No Does the patient have difficulty walking or climbing stairs?: No Does the patient have difficulty dressing or  bathing?: No Does the patient have difficulty doing errands alone such as visiting a doctor's office or shopping?: No   Assessment & Plan  1. Migraine with aura and without status migrainosus, not intractable  - traMADol (ULTRAM) 50 MG tablet; Take 2 tablets (100 mg total) by mouth 4 (four) times daily.  Dispense: 60 tablet; Refill: 0 - nortriptyline (PAMELOR) 10 MG capsule; Take 1 capsule (10 mg total) by mouth at bedtime.  Dispense: 30 capsule; Refill: 2 - almotriptan (AXERT) 12.5 MG tablet; TAKE 1 TABLET BY MOUTH AT ONSET OF MIGRAINE  Dispense: 9 tablet; Refill: 0  2. Anxiety, generalized  - propranolol (INDERAL) 10 MG tablet; Take 1 tablet (10 mg total) by mouth 2 (two) times daily as needed. Anxiety  Dispense: 60 tablet; Refill: 2 - escitalopram (LEXAPRO) 10 MG tablet; Take 1.5 tablets (15 mg total) by mouth daily.  Dispense: 45 tablet; Refill: 2 - clonazePAM (KLONOPIN) 0.5 MG tablet; 0.25 qam, qnoon and 0.5 qhs  Dispense: 10 tablet; Refill: 0  3. Irritable bowel syndrome with diarrhea  - nortriptyline (PAMELOR) 10 MG capsule; Take 1 capsule (10 mg total) by mouth at bedtime.  Dispense: 30 capsule; Refill: 2  4. Morbid obesity, unspecified obesity type [facility])  Discussed with the patient the risk posed  by an increased BMI. Discussed importance of portion control, calorie counting and at least 150 minutes of physical activity weekly. Avoid sweet beverages and drink more water. Eat at least 6 servings of fruit and vegetables daily   5. Dysmetabolic syndrome   6. Migraine without aura, intractable  - promethazine (PHENERGAN) 25 MG tablet; Take 1 tablet (25 mg total) by mouth every 8 (eight) hours as needed.  Dispense: 30 tablet; Refill: 0  7. Social anxiety disorder  - QUEtiapine (SEROQUEL) 25 MG tablet; Take 1 tablet (25 mg total) by mouth at bedtime. For sleep  Dispense: 30 tablet; Refill: 2 - propranolol (INDERAL) 10 MG tablet; Take 1 tablet (10 mg total) by mouth 2 (two) times daily as needed. Anxiety  Dispense: 60 tablet; Refill: 2  8. Tension headache  - baclofen (LIORESAL) 20 MG tablet; Take 1 tablet (20 mg total) by mouth 3 (three) times daily.  Dispense: 30 each; Refill: 0  9. Neck pain  - baclofen (LIORESAL) 20 MG tablet; Take 1 tablet (20 mg total) by mouth 3 (three) times daily.  Dispense: 30 each; Refill: 0

## 2017-01-29 ENCOUNTER — Ambulatory Visit: Payer: Self-pay | Admitting: Family Medicine

## 2017-02-02 ENCOUNTER — Encounter: Payer: Self-pay | Admitting: Family Medicine

## 2017-02-02 ENCOUNTER — Ambulatory Visit (INDEPENDENT_AMBULATORY_CARE_PROVIDER_SITE_OTHER): Payer: 59 | Admitting: Family Medicine

## 2017-02-02 VITALS — BP 118/74 | HR 84 | Temp 97.9°F | Resp 16 | Ht 63.0 in | Wt 242.4 lb

## 2017-02-02 DIAGNOSIS — G43111 Migraine with aura, intractable, with status migrainosus: Secondary | ICD-10-CM

## 2017-02-02 MED ORDER — KETOROLAC TROMETHAMINE 60 MG/2ML IM SOLN
60.0000 mg | Freq: Once | INTRAMUSCULAR | Status: AC
  Administered 2017-02-02: 60 mg via INTRAMUSCULAR

## 2017-02-02 MED ORDER — TRIAMCINOLONE ACETONIDE 10 MG/ML IJ SUSP
10.0000 mg | Freq: Once | INTRAMUSCULAR | Status: AC
  Administered 2017-02-02: 10 mg via INTRAMUSCULAR

## 2017-02-02 NOTE — Progress Notes (Signed)
Name: Sally Mueller   MRN: [id number]    DOB: 1976/05/25   Date:02/02/2017       Progress Note  Subjective  Chief Complaint  Chief Complaint  Patient presents with  . Migraine    since Friday medications have not helped    HPI  Migraine headache: she states episode started Friday with a dull ache, much more intense since Saturday with nausea, aura. She took Tramadol and Axert. This morning she felt better when she woke up but at work, symptoms intensified, felt nauseated, dizzy and scotomas on left peripheral vision. Pain is behind left eye and radiating to nuchal area, described as throbbing behind the left eye and pinching on nuchal area. Improves when she applies pressure on left eye.   Patient Active Problem List   Diagnosis Date Noted  . Posterior neck pain 08/30/2015  . IBS (irritable bowel syndrome) 07/12/2015  . Anxiety, generalized 05/17/2015  . Tension headache 05/17/2015  . Dysmenorrhea 05/17/2015  . Dysmetabolic syndrome 35/57/3220  . Migraine with aura and without status migrainosus 05/17/2015  . Extreme obesity ([place]) 05/17/2015    Past Surgical History:  Procedure Laterality Date  . CESAREAN SECTION  2000, 2002  . CHOLECYSTECTOMY  2008?  [name] LIPOMA EXCISION  05/01/2014  . TUBAL LIGATION  2002  . TUMOR REMOVAL  2012?    Family History  Problem Relation Age of Onset  . Cardiomyopathy Mother   . Hypertension Mother   . Drug abuse Mother   . Hyperlipidemia Father   . Alcohol abuse Sister   . Drug abuse Sister   . Bipolar disorder Sister   . Schizophrenia Sister   . Breast cancer Neg Hx     Social History   Social History  . Marital status: Single    Spouse name: N/A  . Number of children: 3  . Years of education: N/A   Occupational History  . teacher    Social History Main Topics  . Smoking status: Former Smoker    Years: 8.00    Types: Cigarettes    Start date: 11/17/1993    Quit date: 11/17/2001  . Smokeless tobacco: Never Used  . Alcohol use No   . Drug use: No  . Sexual activity: Yes    Partners: Male    Birth control/ protection: Other-see comments     Comment: Tubal Ligation   Other Topics Concern  . Not on file   Social History Narrative   She graduated from school of education      Current Outpatient Prescriptions:  .  almotriptan (AXERT) 12.5 MG tablet, TAKE 1 TABLET BY MOUTH AT ONSET OF MIGRAINE, Disp: 9 tablet, Rfl: 0 .  baclofen (LIORESAL) 20 MG tablet, Take 1 tablet (20 mg total) by mouth 3 (three) times daily., Disp: 30 each, Rfl: 0 .  clonazePAM (KLONOPIN) 0.5 MG tablet, 0.25 qam, qnoon and 0.5 qhs, Disp: 10 tablet, Rfl: 0 .  escitalopram (LEXAPRO) 10 MG tablet, Take 1.5 tablets (15 mg total) by mouth daily., Disp: 45 tablet, Rfl: 2 .  nortriptyline (PAMELOR) 10 MG capsule, Take 1 capsule (10 mg total) by mouth at bedtime., Disp: 30 capsule, Rfl: 2 .  promethazine (PHENERGAN) 25 MG tablet, Take 1 tablet (25 mg total) by mouth every 8 (eight) hours as needed., Disp: 30 tablet, Rfl: 0 .  propranolol (INDERAL) 10 MG tablet, Take 1 tablet (10 mg total) by mouth 2 (two) times daily as needed. Anxiety, Disp: 60 tablet, Rfl: 2 .  QUEtiapine (SEROQUEL) 25 MG tablet, Take 1 tablet (25 mg total) by mouth at bedtime. For sleep, Disp: 30 tablet, Rfl: 2 .  traMADol (ULTRAM) 50 MG tablet, Take 2 tablets (100 mg total) by mouth 4 (four) times daily., Disp: 60 tablet, Rfl: 0  Current Facility-Administered Medications:  .  ketorolac (TORADOL) injection 60 mg, 60 mg, Intramuscular, Once, Steele Sizer, MD .  triamcinolone acetonide (KENALOG) 10 MG/ML injection 10 mg, 10 mg, Intramuscular, Once, Steele Sizer, MD  Allergies  Allergen Reactions  . Sumatriptan     crawling sensation in her body, flushed     ROS  Ten systems reviewed and is negative except as mentioned in HPI   Objective  Vitals:   02/02/17 1441  BP: 118/74  Pulse: 84  Resp: 16  Temp: 97.9 F (36.6 C)  SpO2: 96%  Weight: 242 lb 7 oz (110 kg)   Height: 5\' 3"  (1.6 m)    Body mass index is 42.95 kg/m.  Physical Exam  Constitutional: Patient appears well-developed and well-nourished. Obese. In mild distress.  HEENT: head atraumatic, normocephalic, pupils equal and reactive to light, neck supple, throat within normal limits Cardiovascular: Normal rate, regular rhythm and normal heart sounds.  No murmur heard. No BLE edema. Pulmonary/Chest: Effort normal and breath sounds normal. No respiratory distress. Abdominal: Soft.  There is no tenderness. Psychiatric: Patient has a normal mood and affect. behavior is normal. Judgment and thought content normal. Neurological: no focal findings. Photophobia   PHQ2/9: Depression screen [facility] 2/9 10/27/2016 08/04/2016 05/09/2016 02/12/2016 08/30/2015  Decreased Interest 0 0 0 0 0  Down, Depressed, Hopeless 0 0 0 0 0  PHQ - 2 Score 0 0 0 0 0     Fall Risk: Fall Risk  10/27/2016 08/04/2016 05/09/2016 02/12/2016 08/30/2015  Falls in the past year? No No No No No     Assessment & Plan  1. Intractable migraine with aura with status migrainosus  Her partner Saralyn Pilar came in with her ), advised to go to [facility] if worsening of symptoms  - ketorolac (TORADOL) injection 60 mg; Inject 2 mLs (60 mg total) into the muscle once. - triamcinolone acetonide (KENALOG) 10 MG/ML injection 10 mg; Inject 1 mL (10 mg total) into the muscle once.

## 2017-02-06 ENCOUNTER — Telehealth: Payer: Self-pay | Admitting: Family Medicine

## 2017-02-06 NOTE — Telephone Encounter (Signed)
Headaches since Friday which turned into migraine on Monday. She did not take any medication on Tuesday because she slept all day. Took 4 tramadol on yesterday and 2 today and still the headache will not resolve. She is also taking her other medications as well. She is asking for a return call from Dr Ancil Boozer for reassurance (w) [phone]

## 2017-02-08 NOTE — Telephone Encounter (Signed)
She needs to be seen if she still has the headache, may need imaging studies

## 2017-02-09 ENCOUNTER — Encounter: Payer: Self-pay | Admitting: Family Medicine

## 2017-02-10 ENCOUNTER — Other Ambulatory Visit: Payer: Self-pay | Admitting: Family Medicine

## 2017-02-10 DIAGNOSIS — G4489 Other headache syndrome: Secondary | ICD-10-CM

## 2017-02-10 MED ORDER — TOPIRAMATE 25 MG PO TABS: 25.0000 mg | ORAL_TABLET | Freq: Two times a day (BID) | ORAL | 0 refills | Status: DC

## 2017-02-10 NOTE — Telephone Encounter (Signed)
Spoke with patient and scheduled her an appointment for 02-12-17. Patient had to check with supervisor to see if she is able to get off work. Patient did call back and cancelled appt she did not reschedule for this situation.

## 2017-02-12 ENCOUNTER — Encounter: Payer: Self-pay | Admitting: Family Medicine

## 2017-02-12 ENCOUNTER — Ambulatory Visit: Payer: Self-pay | Admitting: Family Medicine

## 2017-02-16 ENCOUNTER — Telehealth: Payer: Self-pay

## 2017-02-16 NOTE — Telephone Encounter (Signed)
I'm going to suggest she got to the ER; they may need to "break" her migraine and can give her medicines there to arrest this; Dr. Ancil Boozer is out of the office

## 2017-02-16 NOTE — Telephone Encounter (Signed)
Patient called and states she is going on the 7th day of experiencing this bad headache with only taking Topamax.The next appointment is on the 25th with Neurology at [facility], called to ask if they had anything sooner. But they do not, Dr. Melrose Nakayama is out of the office all next week and the 25th with him is the soonest they can see her. Any suggestions on how to help her? 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 She has been resting at all home this entire time and the Topamax has not made her headaches go away. Please advise. Thanks

## 2017-02-16 NOTE — Telephone Encounter (Signed)
I spoke with patient; primary not here; chart documents possible rebound headaches, pt to stop pain meds; patient was advised to go to ER but she says she isn't going, not an emergency; suggested Mg2+, turmeric

## 2017-02-16 NOTE — Telephone Encounter (Signed)
Dr. Sanda Klein spoke to patient and advised her to go to the ER so they are able to performed a head scan to see if there was anything else causing her discomfort. Patient did not want to go the ER due to this "not being a emergency". Dr. Sanda Klein read Dr. Ancil Boozer notes and stated she was sorry the only advice she could give since Dr. Ancil Boozer does not want her on any medication right now is to try otc Magnesium supplement. Along with Turmeric Supplement to help with inflammation.

## 2017-02-27 ENCOUNTER — Telehealth: Payer: Self-pay | Admitting: Family Medicine

## 2017-02-27 ENCOUNTER — Other Ambulatory Visit: Payer: Self-pay | Admitting: Family Medicine

## 2017-02-27 MED ORDER — TOPIRAMATE 100 MG PO TABS: 100.0000 mg | ORAL_TABLET | Freq: Two times a day (BID) | ORAL | 0 refills | Status: DC

## 2017-02-27 NOTE — Telephone Encounter (Signed)
Question about her headaches.  In a lot pain and doesn't know what to do.  Was told by Dr. Ancil Boozer not to take anything pain.  Need advice.

## 2017-02-27 NOTE — Telephone Encounter (Signed)
She states pounding headache, she has not been taking Baclofen or Axert, so advised to resume Baclofen to up to 3 times daily and add Axert for migraine episodes, but not more than 2 per week.  Follow up with Neurologist is coming up, call back or go to [facility] if worsening of symptoms. May need imaging.

## 2017-03-10 DIAGNOSIS — M9901 Segmental and somatic dysfunction of cervical region: Secondary | ICD-10-CM | POA: Diagnosis not present

## 2017-03-11 DIAGNOSIS — R51 Headache: Secondary | ICD-10-CM | POA: Diagnosis not present

## 2017-04-01 DIAGNOSIS — R200 Anesthesia of skin: Secondary | ICD-10-CM | POA: Diagnosis not present

## 2017-04-01 DIAGNOSIS — R202 Paresthesia of skin: Secondary | ICD-10-CM | POA: Diagnosis not present

## 2017-04-01 DIAGNOSIS — R51 Headache: Secondary | ICD-10-CM | POA: Diagnosis not present

## 2017-04-03 ENCOUNTER — Encounter: Payer: Self-pay | Admitting: Family Medicine

## 2017-04-03 ENCOUNTER — Ambulatory Visit (INDEPENDENT_AMBULATORY_CARE_PROVIDER_SITE_OTHER): Payer: 59 | Admitting: Family Medicine

## 2017-04-03 VITALS — BP 118/68 | HR 77 | Temp 98.4°F | Resp 16 | Ht 63.0 in | Wt 242.3 lb

## 2017-04-03 DIAGNOSIS — G43019 Migraine without aura, intractable, without status migrainosus: Secondary | ICD-10-CM

## 2017-04-03 DIAGNOSIS — F4010 Social phobia, unspecified: Secondary | ICD-10-CM | POA: Diagnosis not present

## 2017-04-03 DIAGNOSIS — R51 Headache: Secondary | ICD-10-CM | POA: Diagnosis not present

## 2017-04-03 DIAGNOSIS — F411 Generalized anxiety disorder: Secondary | ICD-10-CM | POA: Diagnosis not present

## 2017-04-03 DIAGNOSIS — K580 Irritable bowel syndrome with diarrhea: Secondary | ICD-10-CM

## 2017-04-03 DIAGNOSIS — G43109 Migraine with aura, not intractable, without status migrainosus: Secondary | ICD-10-CM

## 2017-04-03 DIAGNOSIS — R519 Headache, unspecified: Secondary | ICD-10-CM

## 2017-04-03 MED ORDER — TOPIRAMATE 100 MG PO TABS: 100.0000 mg | ORAL_TABLET | Freq: Every day | ORAL | 0 refills | Status: DC

## 2017-04-03 MED ORDER — NORTRIPTYLINE HCL 10 MG PO CAPS: 20.0000 mg | ORAL_CAPSULE | Freq: Every day | ORAL | 0 refills | Status: DC

## 2017-04-03 MED ORDER — VENLAFAXINE HCL ER 37.5 MG PO CP24: 37.5000 mg | ORAL_CAPSULE | Freq: Every day | ORAL | 0 refills | Status: DC

## 2017-04-03 NOTE — Patient Instructions (Signed)
Go down on dose of Lexapro from 15 mg to 10 mg for 3 days and go down to half pill daily with one tablet of Effexor 37.5 mg for one week, after that stop Lexapro and continue Effexor at total of 75 mg daily or 2 daily.

## 2017-04-03 NOTE — Progress Notes (Signed)
Name: Sally Mueller   MRN: [id number]    DOB: [birth year]   Date:04/03/2017       Progress Note  Subjective  Chief Complaint  Chief Complaint  Patient presents with  . Migraine    paperwork to be filled out  . Medication Refill    HPI  Migraine Headache: she was referred to neurologist because of worsening of symptoms over the pasts couple of months.  She has aura ( she has a sensation of burning / like her nose will bleed ) and migraine happens if she does not take medication for it, pain as sharp/piercing behind left eye, severe, associated with nausea, phonophobia and photophobia and dizziness. She had less than 2 episodes per month, but since Feb 26th, 2018 she had 6 migraines plus daily headaches. Migraine episodes can last up to 3 days.  Symptoms is much worse, having to miss work, she needs FMLA form filled out.  She is seeing Dr. Melrose Nakayama and had medications adjusted and is currently on prednisone taper. Dr. Melrose Nakayama suggested Effexor to help with migraine.   GAD: She finished school August 2017, graduated Dec 2017. She thought she would less stressed but is still feeling overwhelmed and at times feels like the stress is higher. She is in an admnistrative lead position . Stopped Xanax XR advised by Dr. Maricela Curet and was given Klonopin, however to take only prn. She states Lexapro does not seem to control all her symptoms. She is on 15 mg daily, and denies side effects, we will switch to Effexor. She takes Klonopin 10 pills for 3 months.   IBS: she changed her diet since last visit and Nortriptyline is helping with abdominal pain/cramping. She has intermittent episodes of constipation and diarrhea. Triggered by certain types of food  Neck pain/tension headaches: she was taking  Baclofen prn, but has been going to chiropractor prn  Metabolic syndrome: she denies polyphagia, polydipsia or polyuria, increase in abdominal girth.    Patient Active Problem List   Diagnosis Date Noted  .  Posterior neck pain 08/30/2015  . IBS (irritable bowel syndrome) 07/12/2015  . Anxiety, generalized 05/17/2015  . Headache disorder 05/17/2015  . Dysmenorrhea 05/17/2015  . Dysmetabolic syndrome 31/54/0086  . Migraine with aura and without status migrainosus 05/17/2015  . Extreme obesity 05/17/2015    Past Surgical History:  Procedure Laterality Date  . CESAREAN SECTION  2000, 2002  . CHOLECYSTECTOMY  2008?  [name] LIPOMA EXCISION  05/01/2014  . TUBAL LIGATION  2002  . TUMOR REMOVAL  2012?    Family History  Problem Relation Age of Onset  . Cardiomyopathy Mother   . Hypertension Mother   . Drug abuse Mother   . Hyperlipidemia Father   . Alcohol abuse Sister   . Drug abuse Sister   . Bipolar disorder Sister   . Schizophrenia Sister   . Breast cancer Neg Hx     Social History   Social History  . Marital status: Single    Spouse name: N/A  . Number of children: 3  . Years of education: N/A   Occupational History  . teacher    Social History Main Topics  . Smoking status: Former Smoker    Years: 8.00    Types: Cigarettes    Start date: 11/17/1993    Quit date: 11/17/2001  . Smokeless tobacco: Never Used  . Alcohol use No  . Drug use: No  . Sexual activity: Yes    Partners: Male  Birth control/ protection: Other-see comments     Comment: Tubal Ligation   Other Topics Concern  . Not on file   Social History Narrative   She graduated from school of education      Current Outpatient Prescriptions:  .  almotriptan (AXERT) 12.5 MG tablet, TAKE 1 TABLET BY MOUTH AT ONSET OF MIGRAINE, Disp: 9 tablet, Rfl: 0 .  baclofen (LIORESAL) 20 MG tablet, Take 1 tablet (20 mg total) by mouth 3 (three) times daily., Disp: 30 each, Rfl: 0 .  clonazePAM (KLONOPIN) 0.5 MG tablet, 0.25 qam, qnoon and 0.5 qhs, Disp: 10 tablet, Rfl: 0 .  nortriptyline (PAMELOR) 10 MG capsule, Take 2-3 capsules (20-30 mg total) by mouth at bedtime. 10 mg up to twice daily and 30 mg at night, Disp: 30  capsule, Rfl: 0 .  promethazine (PHENERGAN) 25 MG tablet, Take 1 tablet (25 mg total) by mouth every 8 (eight) hours as needed., Disp: 30 tablet, Rfl: 0 .  propranolol (INDERAL) 10 MG tablet, Take 1 tablet (10 mg total) by mouth 2 (two) times daily as needed. Anxiety, Disp: 60 tablet, Rfl: 2 .  QUEtiapine (SEROQUEL) 25 MG tablet, Take 1 tablet (25 mg total) by mouth at bedtime. For sleep, Disp: 30 tablet, Rfl: 2 .  topiramate (TOPAMAX) 100 MG tablet, Take 1 tablet (100 mg total) by mouth at bedtime. Start on 25 mg and titrate up by one pill every 3 days max of 100 mg daily, Disp: 30 tablet, Rfl: 0 .  venlafaxine XR (EFFEXOR XR) 37.5 MG 24 hr capsule, Take 1-2 capsules (37.5-75 mg total) by mouth daily with breakfast., Disp: 60 capsule, Rfl: 0  Allergies  Allergen Reactions  . Sumatriptan     crawling sensation in her body, flushed     ROS  Constitutional: Negative for fever or weight change.  Respiratory: Negative for cough and shortness of breath.   Cardiovascular: Negative for chest pain or palpitations.  Gastrointestinal: Negative for abdominal pain, no bowel changes.  Musculoskeletal: Negative for gait problem or joint swelling.  Skin: Negative for rash.  Neurological: Negative for dizziness , positive for  headache.  No other specific complaints in a complete review of systems (except as listed in HPI above).  Objective  Vitals:   04/03/17 1338  BP: 118/68  Pulse: 77  Resp: 16  Temp: 98.4 F (36.9 C)  SpO2: 96%  Weight: 242 lb 5 oz (109.9 kg)  Height: 5\' 3"  (1.6 m)    Body mass index is 42.92 kg/m.  Physical Exam  Constitutional: Patient appears well-developed and well-nourished. Obese No distress.  HEENT: head atraumatic, normocephalic, pupils equal and reactive to light, neck supple, throat within normal limits Cardiovascular: Normal rate, regular rhythm and normal heart sounds.  No murmur heard. No BLE edema. Pulmonary/Chest: Effort normal and breath sounds  normal. No respiratory distress. Abdominal: Soft.  There is no tenderness. Psychiatric: Patient has a normal mood and affect. behavior is normal. Judgment and thought content normal. Neurological: no focal findings.  PHQ2/9: Depression screen [facility] 2/9 10/27/2016 08/04/2016 05/09/2016 02/12/2016 08/30/2015  Decreased Interest 0 0 0 0 0  Down, Depressed, Hopeless 0 0 0 0 0  PHQ - 2 Score 0 0 0 0 0    Fall Risk: Fall Risk  10/27/2016 08/04/2016 05/09/2016 02/12/2016 08/30/2015  Falls in the past year? No No No No No      Assessment & Plan  1. Anxiety, generalized  We will change to Effexor to help control  migraine as suggested by Dr. Melrose Nakayama. She understands how to titrate medications - venlafaxine XR (EFFEXOR XR) 37.5 MG 24 hr capsule; Take 1-2 capsules (37.5-75 mg total) by mouth daily with breakfast.  Dispense: 60 capsule; Refill: 0  2. Headache disorder  Seeing Dr. Melrose Nakayama on higher dose of Elavil   3. Migraine without aura, intractable  - venlafaxine XR (EFFEXOR XR) 37.5 MG 24 hr capsule; Take 1-2 capsules (37.5-75 mg total) by mouth daily with breakfast.  Dispense: 60 capsule; Refill: 0  4. Social anxiety disorder   5. Irritable bowel syndrome with diarrhea  Stable on Elavil   6. Migraine with aura and without status migrainosus, not intractable  - nortriptyline (PAMELOR) 10 MG capsule; Take 2-3 capsules (20-30 mg total) by mouth at bedtime. 10 mg up to twice daily and 30 mg at night  Dispense: 30 capsule; Refill: 0

## 2017-04-29 ENCOUNTER — Encounter: Payer: Self-pay | Admitting: Family Medicine

## 2017-04-29 ENCOUNTER — Ambulatory Visit (INDEPENDENT_AMBULATORY_CARE_PROVIDER_SITE_OTHER): Payer: 59 | Admitting: Family Medicine

## 2017-04-29 VITALS — BP 126/74 | HR 88 | Temp 98.0°F | Resp 16 | Ht 63.0 in | Wt 238.7 lb

## 2017-04-29 DIAGNOSIS — R519 Headache, unspecified: Secondary | ICD-10-CM

## 2017-04-29 DIAGNOSIS — G44209 Tension-type headache, unspecified, not intractable: Secondary | ICD-10-CM

## 2017-04-29 DIAGNOSIS — G43019 Migraine without aura, intractable, without status migrainosus: Secondary | ICD-10-CM

## 2017-04-29 DIAGNOSIS — R51 Headache: Secondary | ICD-10-CM

## 2017-04-29 DIAGNOSIS — F32A Depression, unspecified: Secondary | ICD-10-CM

## 2017-04-29 DIAGNOSIS — E8881 Metabolic syndrome: Secondary | ICD-10-CM | POA: Diagnosis not present

## 2017-04-29 DIAGNOSIS — F411 Generalized anxiety disorder: Secondary | ICD-10-CM

## 2017-04-29 DIAGNOSIS — F320 Major depressive disorder, single episode, mild: Secondary | ICD-10-CM

## 2017-04-29 DIAGNOSIS — F4010 Social phobia, unspecified: Secondary | ICD-10-CM

## 2017-04-29 DIAGNOSIS — M542 Cervicalgia: Secondary | ICD-10-CM

## 2017-04-29 MED ORDER — CLONAZEPAM 0.5 MG PO TABS: ORAL_TABLET | ORAL | 0 refills | Status: DC

## 2017-04-29 MED ORDER — VENLAFAXINE HCL ER 150 MG PO CP24: 150.0000 mg | ORAL_CAPSULE | Freq: Every day | ORAL | 2 refills | Status: DC

## 2017-04-29 NOTE — Progress Notes (Signed)
Name: Sally Mueller   MRN: [id number]    DOB: [birth year]   Date:04/29/2017       Progress Note  Subjective  Chief Complaint  Chief Complaint  Patient presents with  . Anxiety    Changed from Lexapro to Effexor and anxiety has been worst, she has been cursing alot more lately and has been very irritable.  . Follow-up    1 month    HPI   Migraine and headache constant: seeing Dr. Melrose Nakayama, she states only had 4 days of headache free, when she was on Medrol dose pack, she is very concerned because pain is so intense and episodes have been more frequent and constant then before. Associated with nausea, vomiting, and ice pick sensation with head movements. Discussed imaging, but she will wait until she sees Dr. Melrose Nakayama.   GAD: we changed from Lexapro to Effexor one month ago, but she continues to have anxiety attacks occasionally, feels overwhelmed by minor things and she feels very tired after an episode. She finished school August 2017, graduated Dec 2017. She is in an admnistrative lead position . Stopped Xanax XR advised by Dr. Maricela Curet and was given Klonopin, however to take only prn She takes Klonopin 10 pills for 3 months. Advised her to see a counselor, she does not want to go back to psychiatrist. She is starting to feel depressed, sleeping more than usual, sleeping all weekend. She is tired of the decrease in quality of life.   Neck pain/tension headaches: she was taking Baclofen prn, but has been going to chiropractor prn  Metabolic syndrome: she denies polyphagia, polydipsia or polyuria, increase in abdominal girth.   Patient Active Problem List   Diagnosis Date Noted  . Posterior neck pain 08/30/2015  . IBS (irritable bowel syndrome) 07/12/2015  . Anxiety, generalized 05/17/2015  . Headache disorder 05/17/2015  . Dysmenorrhea 05/17/2015  . Dysmetabolic syndrome 40/97/3532  . Migraine with aura and without status migrainosus 05/17/2015  . Extreme obesity 05/17/2015    Past  Surgical History:  Procedure Laterality Date  . CESAREAN SECTION  2000, 2002  . CHOLECYSTECTOMY  2008?  [name] LIPOMA EXCISION  05/01/2014  . TUBAL LIGATION  2002  . TUMOR REMOVAL  2012?    Family History  Problem Relation Age of Onset  . Cardiomyopathy Mother   . Hypertension Mother   . Drug abuse Mother   . Hyperlipidemia Father   . Alcohol abuse Sister   . Drug abuse Sister   . Bipolar disorder Sister   . Schizophrenia Sister   . Breast cancer Neg Hx     Social History   Social History  . Marital status: Single    Spouse name: N/A  . Number of children: 3  . Years of education: N/A   Occupational History  . teacher    Social History Main Topics  . Smoking status: Former Smoker    Years: 8.00    Types: Cigarettes    Start date: 11/17/1993    Quit date: 11/17/2001  . Smokeless tobacco: Never Used  . Alcohol use No  . Drug use: No  . Sexual activity: Yes    Partners: Male    Birth control/ protection: Other-see comments     Comment: Tubal Ligation   Other Topics Concern  . Not on file   Social History Narrative   She graduated from school of education      Current Outpatient Prescriptions:  .  almotriptan (AXERT) 12.5 MG tablet, TAKE 1  TABLET BY MOUTH AT ONSET OF MIGRAINE, Disp: 9 tablet, Rfl: 0 .  baclofen (LIORESAL) 20 MG tablet, Take 1 tablet (20 mg total) by mouth 3 (three) times daily., Disp: 30 each, Rfl: 0 .  clonazePAM (KLONOPIN) 0.5 MG tablet, 0.25 qam, qnoon and 0.5 qhs, Disp: 10 tablet, Rfl: 0 .  nortriptyline (PAMELOR) 10 MG capsule, Take 2-3 capsules (20-30 mg total) by mouth at bedtime. 10 mg up to twice daily and 30 mg at night, Disp: 30 capsule, Rfl: 0 .  promethazine (PHENERGAN) 25 MG tablet, Take 1 tablet (25 mg total) by mouth every 8 (eight) hours as needed., Disp: 30 tablet, Rfl: 0 .  propranolol (INDERAL) 10 MG tablet, Take 1 tablet (10 mg total) by mouth 2 (two) times daily as needed. Anxiety, Disp: 60 tablet, Rfl: 2 .  topiramate (TOPAMAX)  100 MG tablet, Take 1 tablet (100 mg total) by mouth at bedtime. Start on 25 mg and titrate up by one pill every 3 days max of 100 mg daily, Disp: 30 tablet, Rfl: 0 .  venlafaxine XR (EFFEXOR XR) 37.5 MG 24 hr capsule, Take 1-2 capsules (37.5-75 mg total) by mouth daily with breakfast., Disp: 60 capsule, Rfl: 0  Allergies  Allergen Reactions  . Sumatriptan     crawling sensation in her body, flushed     ROS  Constitutional: Negative for fever or weight change.  Respiratory: Negative for cough and shortness of breath.   Cardiovascular: Negative for chest pain or palpitations.  Gastrointestinal: Negative for abdominal pain, no bowel changes.  Musculoskeletal: Negative for gait problem or joint swelling.  Skin: Negative for rash.  Neurological: Negative for dizziness , positive for  headache.  No other specific complaints in a complete review of systems (except as listed in HPI above).  Objective  Vitals:   04/29/17 1616  BP: 126/74  Pulse: 88  Resp: 16  Temp: 98 F (36.7 C)  TempSrc: Oral  SpO2: 98%  Weight: 238 lb 11.2 oz (108.3 kg)  Height: 5\' 3"  (1.6 m)    Body mass index is 42.28 kg/m.  Physical Exam  Constitutional: Patient appears well-developed and well-nourished. Obese No distress.  HEENT: head atraumatic, normocephalic, pupils equal and reactive to light,  neck supple, throat within normal limits Cardiovascular: Normal rate, regular rhythm and normal heart sounds.  No murmur heard. No BLE edema. Pulmonary/Chest: Effort normal and breath sounds normal. No respiratory distress. Abdominal: Soft.  There is no tenderness. Psychiatric: Patient has a normal mood and affect. behavior is normal. Judgment and thought content normal. Neurological: no focal findings.   PHQ2/9: Depression screen [facility] 2/9 04/29/2017 10/27/2016 08/04/2016 05/09/2016 02/12/2016  Decreased Interest 2 0 0 0 0  Down, Depressed, Hopeless 2 0 0 0 0  PHQ - 2 Score 4 0 0 0 0  Altered sleeping 3 - - -  -  Tired, decreased energy 3 - - - -  Change in appetite 3 - - - -  Feeling bad or failure about yourself  3 - - - -  Trouble concentrating 1 - - - -  Moving slowly or fidgety/restless 0 - - - -  Suicidal thoughts 0 - - - -  PHQ-9 Score 17 - - - -  Difficult doing work/chores Very difficult - - - -     Fall Risk: Fall Risk  04/29/2017 10/27/2016 08/04/2016 05/09/2016 02/12/2016  Falls in the past year? No No No No No     Functional Status Survey: Is the  patient deaf or have difficulty hearing?: No Does the patient have difficulty seeing, even when wearing glasses/contacts?: No Does the patient have difficulty concentrating, remembering, or making decisions?: No Does the patient have difficulty walking or climbing stairs?: No Does the patient have difficulty dressing or bathing?: No Does the patient have difficulty doing errands alone such as visiting a doctor's office or shopping?: No    Assessment & Plan  1. Dysmetabolic syndrome  Discussed life style modification   2. Headache disorder  Continue medications as recommended by Dr. Melrose Nakayama  3. Anxiety, generalized  - clonazePAM (KLONOPIN) 0.5 MG tablet; 0.25 qam, qnoon and 0.5 qhs  Dispense: 10 tablet; Refill: 0 - venlafaxine XR (EFFEXOR-XR) 150 MG 24 hr capsule; Take 1 capsule (150 mg total) by mouth daily with breakfast.  Dispense: 30 capsule; Refill: 2  4. Migraine without aura, intractable  - venlafaxine XR (EFFEXOR-XR) 150 MG 24 hr capsule; Take 1 capsule (150 mg total) by mouth daily with breakfast.  Dispense: 30 capsule; Refill: 2  5. Social anxiety disorder  Needs to see therapist  6. Tension headache  Continue follow up with Dr. Melrose Nakayama  7. Neck pain  Continue baclofen prn   8. Mild depression (HCC)  - venlafaxine XR (EFFEXOR-XR) 150 MG 24 hr capsule; Take 1 capsule (150 mg total) by mouth daily with breakfast.  Dispense: 30 capsule; Refill: 2

## 2017-05-12 DIAGNOSIS — G479 Sleep disorder, unspecified: Secondary | ICD-10-CM | POA: Diagnosis not present

## 2017-05-12 DIAGNOSIS — R51 Headache: Secondary | ICD-10-CM | POA: Diagnosis not present

## 2017-05-12 DIAGNOSIS — R200 Anesthesia of skin: Secondary | ICD-10-CM | POA: Diagnosis not present

## 2017-05-18 ENCOUNTER — Other Ambulatory Visit: Payer: Self-pay | Admitting: Neurology

## 2017-05-18 DIAGNOSIS — G44209 Tension-type headache, unspecified, not intractable: Secondary | ICD-10-CM

## 2017-06-02 ENCOUNTER — Ambulatory Visit: Payer: 59

## 2017-06-23 DIAGNOSIS — G43101 Migraine with aura, not intractable, with status migrainosus: Secondary | ICD-10-CM | POA: Diagnosis not present

## 2017-06-23 DIAGNOSIS — R51 Headache: Secondary | ICD-10-CM | POA: Diagnosis not present

## 2017-06-23 DIAGNOSIS — G479 Sleep disorder, unspecified: Secondary | ICD-10-CM | POA: Diagnosis not present

## 2017-06-29 ENCOUNTER — Other Ambulatory Visit: Payer: Self-pay | Admitting: Family Medicine

## 2017-06-29 ENCOUNTER — Telehealth: Payer: Self-pay

## 2017-06-29 DIAGNOSIS — F411 Generalized anxiety disorder: Secondary | ICD-10-CM

## 2017-06-29 MED ORDER — CLONAZEPAM 0.5 MG PO TABS: ORAL_TABLET | ORAL | 0 refills | Status: DC

## 2017-06-29 NOTE — Telephone Encounter (Signed)
Patient states her anxiety has been elevated and wanted to see if Dr. Ancil Boozer could fill her medication.

## 2017-06-30 ENCOUNTER — Telehealth: Payer: Self-pay

## 2017-06-30 NOTE — Telephone Encounter (Signed)
Call in for one up to three times daily ( patient knows it must last 3 months)

## 2017-06-30 NOTE — Telephone Encounter (Signed)
Done

## 2017-06-30 NOTE — Telephone Encounter (Signed)
Pharmacy called needing clarification on clonazepam dosage. Please check dosage

## 2017-07-29 ENCOUNTER — Encounter: Payer: Self-pay | Admitting: Family Medicine

## 2017-07-29 ENCOUNTER — Ambulatory Visit (INDEPENDENT_AMBULATORY_CARE_PROVIDER_SITE_OTHER): Payer: 59 | Admitting: Family Medicine

## 2017-07-29 VITALS — BP 112/68 | HR 98 | Temp 98.5°F | Resp 16 | Ht 63.0 in | Wt 242.7 lb

## 2017-07-29 DIAGNOSIS — R51 Headache: Secondary | ICD-10-CM | POA: Diagnosis not present

## 2017-07-29 DIAGNOSIS — Z13220 Encounter for screening for lipoid disorders: Secondary | ICD-10-CM | POA: Diagnosis not present

## 2017-07-29 DIAGNOSIS — R2231 Localized swelling, mass and lump, right upper limb: Secondary | ICD-10-CM

## 2017-07-29 DIAGNOSIS — F411 Generalized anxiety disorder: Secondary | ICD-10-CM | POA: Diagnosis not present

## 2017-07-29 DIAGNOSIS — F32A Depression, unspecified: Secondary | ICD-10-CM

## 2017-07-29 DIAGNOSIS — E8881 Metabolic syndrome: Secondary | ICD-10-CM

## 2017-07-29 DIAGNOSIS — G43109 Migraine with aura, not intractable, without status migrainosus: Secondary | ICD-10-CM

## 2017-07-29 DIAGNOSIS — R5383 Other fatigue: Secondary | ICD-10-CM

## 2017-07-29 DIAGNOSIS — F320 Major depressive disorder, single episode, mild: Secondary | ICD-10-CM | POA: Diagnosis not present

## 2017-07-29 DIAGNOSIS — R519 Headache, unspecified: Secondary | ICD-10-CM

## 2017-07-29 MED ORDER — ATENOLOL 25 MG PO TABS: 25.0000 mg | ORAL_TABLET | Freq: Every evening | ORAL | 1 refills | Status: DC

## 2017-07-29 MED ORDER — HYDROXYZINE HCL 10 MG PO TABS: 10.0000 mg | ORAL_TABLET | Freq: Three times a day (TID) | ORAL | 0 refills | Status: DC | PRN

## 2017-07-29 MED ORDER — ESCITALOPRAM OXALATE 10 MG PO TABS: 10.0000 mg | ORAL_TABLET | Freq: Every day | ORAL | 1 refills | Status: DC

## 2017-07-29 NOTE — Progress Notes (Signed)
Name: Sally Mueller   MRN: [id number]    DOB: Dec 15, 1975   Date:07/29/2017       Progress Note  Subjective  Chief Complaint  Chief Complaint  Patient presents with  . Medication Refill    3 month F/U  . Migraine  . GAD    Patient states the Effexor gives her rebound headache and does not help her anxiety.  . Mass    on right axillary area for the past month and would like Dr. Ancil Boozer to look at it. It is swollen at times and hurts     HPI  Migraine and headache constant: seeing Dr. Melrose Nakayama, she is off beta-blocker but states anxiety got much worse since stopped medication. We tried switching from Lexapro to Effexor to see if headache would improve but headache did not change but anxiety got much worse. She had two migraine episodes in the past 2 weeks, but has daily tension type headache and has to take Fioricet daily  GAD: we changed from Lexapro to Effexor one month ago, but she continues to have anxiety attacks occasionally, feels overwhelmed by minor things and she feels very tired after an episode. She finished school August 2017, graduated Dec 2017. She is in an admnistrative lead position . Stopped Xanax XR advised by Dr. Maricela Curet and was given Klonopin, however to take only prn She takes Klonopin 10 pills for 3 months. Advised her to see a counselor, she does not want to go back to psychiatrist. She is starting to feel depressed, sleeping more than usual, sleeping all weekend, crying more often, she is off SSRI, we will resume Lexapro today  Neck pain/tension headaches: she was taking Baclofen prn, and went to Chiropractor, currently on Fioricet , and took 30 pills in the past month - given by Dr. Melrose Nakayama.   Metabolic syndrome: she denies polyphagia, polydipsia or polyuria, increase in abdominal girth.   Right axillary mass: wax and wanes, tender at times on right axilla, no nipple discharge or breast lumps.    Patient Active Problem List   Diagnosis Date Noted  . Posterior neck  pain 08/30/2015  . IBS (irritable bowel syndrome) 07/12/2015  . Anxiety, generalized 05/17/2015  . Headache disorder 05/17/2015  . Dysmenorrhea 05/17/2015  . Dysmetabolic syndrome 21/30/8657  . Migraine with aura and without status migrainosus 05/17/2015  . Extreme obesity 05/17/2015    Past Surgical History:  Procedure Laterality Date  . CESAREAN SECTION  2000, 2002  . CHOLECYSTECTOMY  2008?  [name] LIPOMA EXCISION  05/01/2014  . TUBAL LIGATION  2002  . TUMOR REMOVAL  2012?    Family History  Problem Relation Age of Onset  . Cardiomyopathy Mother   . Hypertension Mother   . Drug abuse Mother   . Hyperlipidemia Father   . Alcohol abuse Sister   . Drug abuse Sister   . Bipolar disorder Sister   . Schizophrenia Sister   . Breast cancer Neg Hx     Social History   Social History  . Marital status: Single    Spouse name: N/A  . Number of children: 3  . Years of education: N/A   Occupational History  . teacher    Social History Main Topics  . Smoking status: Former Smoker    Years: 8.00    Types: Cigarettes    Start date: 11/17/1993    Quit date: 11/17/2001  . Smokeless tobacco: Never Used  . Alcohol use No  . Drug use: No  .  Sexual activity: Yes    Partners: Male    Birth control/ protection: Other-see comments     Comment: Tubal Ligation   Other Topics Concern  . Not on file   Social History Narrative   She graduated from school of education      Current Outpatient Prescriptions:  .  almotriptan (AXERT) 12.5 MG tablet, TAKE 1 TABLET BY MOUTH AT ONSET OF MIGRAINE, Disp: 9 tablet, Rfl: 0 .  butalbital-acetaminophen-caffeine (FIORICET, ESGIC) 50-325-40 MG tablet, Take 1 tablet by mouth every 4 (four) hours as needed., Disp: , Rfl:  .  clonazePAM (KLONOPIN) 0.5 MG tablet, 0.25 qam, qnoon and 0.5 qhs, Disp: 10 tablet, Rfl: 0 .  nortriptyline (PAMELOR) 50 MG capsule, Take 1 capsule by mouth at bedtime., Disp: , Rfl:  .  promethazine (PHENERGAN) 25 MG tablet, Take 1  tablet (25 mg total) by mouth every 8 (eight) hours as needed., Disp: 30 tablet, Rfl: 0 .  topiramate (TOPAMAX) 100 MG tablet, Take 1 tablet (100 mg total) by mouth at bedtime. Start on 25 mg and titrate up by one pill every 3 days max of 100 mg daily, Disp: 30 tablet, Rfl: 0 .  atenolol (TENORMIN) 25 MG tablet, Take 1 tablet (25 mg total) by mouth every evening., Disp: 90 tablet, Rfl: 1 .  escitalopram (LEXAPRO) 10 MG tablet, Take 1 tablet (10 mg total) by mouth daily., Disp: 90 tablet, Rfl: 1 .  hydrOXYzine (ATARAX/VISTARIL) 10 MG tablet, Take 1 tablet (10 mg total) by mouth every 8 (eight) hours as needed for anxiety., Disp: 90 tablet, Rfl: 0  Allergies  Allergen Reactions  . Sumatriptan     crawling sensation in her body, flushed     ROS  Constitutional: Negative for fever or weight change.  Respiratory: Negative for cough and shortness of breath.   Cardiovascular: Negative for chest pain or palpitations.  Gastrointestinal: Negative for abdominal pain, no bowel changes.  Musculoskeletal: Negative for gait problem or joint swelling.  Skin: Negative for rash.  Neurological: Negative for dizziness, positive for daily  Headache and also two episodes of migraine past 2 weeks.  No other specific complaints in a complete review of systems (except as listed in HPI above).  Objective  Vitals:   07/29/17 1540  BP: 112/68  Pulse: 98  Resp: 16  Temp: 98.5 F (36.9 C)  TempSrc: Oral  SpO2: 98%  Weight: 242 lb 11.2 oz (110.1 kg)  Height: 5\' 3"  (1.6 m)    Body mass index is 42.99 kg/m.  Physical Exam  Constitutional: Patient appears well-developed and well-nourished. Obese  No distress.  HEENT: head atraumatic, normocephalic, pupils equal and reactive to light,  neck supple, throat within normal limits Cardiovascular: Normal rate, regular rhythm and normal heart sounds.  No murmur heard. No BLE edema. Pulmonary/Chest: Effort normal and breath sounds normal. No respiratory  distress. Abdominal: Soft.  There is no tenderness. Psychiatric: Patient has a normal mood and affect. behavior is normal. Judgment and thought content normal.  PHQ2/9: Depression screen [facility] 2/9 07/29/2017 04/29/2017 10/27/2016 08/04/2016 05/09/2016  Decreased Interest 0 2 0 0 0  Down, Depressed, Hopeless 0 2 0 0 0  PHQ - 2 Score 0 4 0 0 0  Altered sleeping - 3 - - -  Tired, decreased energy - 3 - - -  Change in appetite - 3 - - -  Feeling bad or failure about yourself  - 3 - - -  Trouble concentrating - 1 - - -  Moving slowly or fidgety/restless - 0 - - -  Suicidal thoughts - 0 - - -  PHQ-9 Score - 17 - - -  Difficult doing work/chores - Very difficult - - -     Fall Risk: Fall Risk  07/29/2017 04/29/2017 10/27/2016 08/04/2016 05/09/2016  Falls in the past year? No No No No No    Functional Status Survey: Is the patient deaf or have difficulty hearing?: No Does the patient have difficulty seeing, even when wearing glasses/contacts?: No Does the patient have difficulty concentrating, remembering, or making decisions?: No Does the patient have difficulty walking or climbing stairs?: No Does the patient have difficulty dressing or bathing?: No Does the patient have difficulty doing errands alone such as visiting a doctor's office or shopping?: No   Assessment & Plan  1. Anxiety, generalized  I will resume you Atenolol since it helps with anxiety, we will also try Hydroxyzine - escitalopram (LEXAPRO) 10 MG tablet; Take 1 tablet (10 mg total) by mouth daily.  Dispense: 90 tablet; Refill: 1 - atenolol (TENORMIN) 25 MG tablet; Take 1 tablet (25 mg total) by mouth every evening.  Dispense: 90 tablet; Refill: 1 - hydrOXYzine (ATARAX/VISTARIL) 10 MG tablet; Take 1 tablet (10 mg total) by mouth every 8 (eight) hours as needed for anxiety.  Dispense: 90 tablet; Refill: 0  2. Dysmetabolic syndrome  - Hemoglobin A1c - Insulin, fasting  3. Headache disorder  Needs to follow up with  Neurologist   4. Mild depression ([place])  Resume Lexapro , she is only sleeping when she gets home - COMPLETE METABOLIC PANEL WITH GFR - CBC with Differential/Platelet  5. Migraine with aura and without status migrainosus, not intractable  Continue follow up with neurologist - Dr Manuella Ghazi  6. Morbid obesity, unspecified obesity type ([place])  - TSH  7. Lipid screening  - Lipid panel  8. Other fatigue  Likely from depression  - COMPLETE METABOLIC PANEL WITH GFR - CBC with Differential/Platelet - TSH - Vitamin B12 - VITAMIN D 25 Hydroxy (Vit-D Deficiency, Fractures)  9. Axillary mass, right  - US BREAST LTD UNI RIGHT INC AXILLA; Future

## 2017-07-31 ENCOUNTER — Ambulatory Visit: Payer: Self-pay | Admitting: Family Medicine

## 2017-08-17 ENCOUNTER — Other Ambulatory Visit: Payer: Self-pay | Admitting: Family Medicine

## 2017-08-18 NOTE — Telephone Encounter (Signed)
Patient requesting refill of Topamax to [facility].

## 2017-09-16 ENCOUNTER — Other Ambulatory Visit: Payer: Self-pay

## 2017-09-16 ENCOUNTER — Encounter: Payer: Self-pay | Admitting: Family Medicine

## 2017-09-16 ENCOUNTER — Other Ambulatory Visit: Payer: Self-pay | Admitting: Family Medicine

## 2017-09-16 ENCOUNTER — Ambulatory Visit (INDEPENDENT_AMBULATORY_CARE_PROVIDER_SITE_OTHER): Payer: 59 | Admitting: Family Medicine

## 2017-09-16 VITALS — BP 110/60 | HR 85 | Resp 14 | Ht 63.0 in | Wt 242.3 lb

## 2017-09-16 DIAGNOSIS — E8881 Metabolic syndrome: Secondary | ICD-10-CM

## 2017-09-16 DIAGNOSIS — R5383 Other fatigue: Secondary | ICD-10-CM

## 2017-09-16 DIAGNOSIS — G43109 Migraine with aura, not intractable, without status migrainosus: Secondary | ICD-10-CM | POA: Diagnosis not present

## 2017-09-16 DIAGNOSIS — E785 Hyperlipidemia, unspecified: Secondary | ICD-10-CM

## 2017-09-16 DIAGNOSIS — F411 Generalized anxiety disorder: Secondary | ICD-10-CM | POA: Diagnosis not present

## 2017-09-16 DIAGNOSIS — Z79899 Other long term (current) drug therapy: Secondary | ICD-10-CM

## 2017-09-16 MED ORDER — CLONAZEPAM 0.5 MG PO TABS: ORAL_TABLET | ORAL | 0 refills | Status: DC

## 2017-09-16 MED ORDER — ERENUMAB-AOOE 70 MG/ML ~~LOC~~ SOAJ: 70.0000 mg | SUBCUTANEOUS | 1 refills | Status: DC

## 2017-09-16 NOTE — Progress Notes (Signed)
Name: Sally Mueller   MRN: [id number]    DOB: November 04, 1976   Date:09/16/2017       Progress Note  Subjective  Chief Complaint  Chief Complaint  Patient presents with  . Anxiety  . Depression  . Headache    HPI  Migraine and headache constant: seeing Dr. Melrose Nakayama, she is back on  beta-blocker because it also helps control anxiety. We tried switching from Lexapro to Effexor to see if headache would improve but headache did not change but anxiety got much worse. She takes Topamax 100 mg every night, Nortriptyline 50 mg qhs and prn Fiorcet when she has tension type headache. Even with all preventive medication she still has on average 3 episodes per month, that is debilitating ( misses work) and can last 2-3 days per episode. She takes Axert and when she takes quickly it can last less than 24 hours. She states last episode was 2 weeks ago and continues to have throbbing sensation behind left eye, nausea, and episode of aura ( flashing sensation on left peripheral vision). She states using computer at work aggravates, also reading. She has associated photophobia, phonophobia, also sensitivity to smell. She has episodes of dizziness with migraine. Her next appointment with Dr. Melrose Nakayama is in December but feels like she cannot wait that long, afraid that episodes will daily like they were this past Summer.   She also states out of Klonopin and would like a refill   Patient Active Problem List   Diagnosis Date Noted  . Posterior neck pain 08/30/2015  . IBS (irritable bowel syndrome) 07/12/2015  . Anxiety, generalized 05/17/2015  . Headache disorder 05/17/2015  . Dysmenorrhea 05/17/2015  . Dysmetabolic syndrome 56/43/3295  . Migraine with aura and without status migrainosus 05/17/2015  . Extreme obesity 05/17/2015    Past Surgical History:  Procedure Laterality Date  . CESAREAN SECTION  2000, 2002  . CHOLECYSTECTOMY  2008?  [name] LIPOMA EXCISION  05/01/2014  . TUBAL LIGATION  2002  . TUMOR REMOVAL   2012?    Family History  Problem Relation Age of Onset  . Cardiomyopathy Mother   . Hypertension Mother   . Drug abuse Mother   . Hyperlipidemia Father   . Alcohol abuse Sister   . Drug abuse Sister   . Bipolar disorder Sister   . Schizophrenia Sister   . Breast cancer Neg Hx     Social History   Social History  . Marital status: Single    Spouse name: N/A  . Number of children: 3  . Years of education: N/A   Occupational History  . teacher    Social History Main Topics  . Smoking status: Former Smoker    Years: 8.00    Types: Cigarettes    Start date: 11/17/1993    Quit date: 11/17/2001  . Smokeless tobacco: Never Used  . Alcohol use No  . Drug use: No  . Sexual activity: Yes    Partners: Male    Birth control/ protection: Other-see comments     Comment: Tubal Ligation   Other Topics Concern  . Not on file   Social History Narrative   She graduated from school of education      Current Outpatient Prescriptions:  .  almotriptan (AXERT) 12.5 MG tablet, TAKE 1 TABLET BY MOUTH AT ONSET OF MIGRAINE, Disp: 9 tablet, Rfl: 0 .  atenolol (TENORMIN) 25 MG tablet, Take 1 tablet (25 mg total) by mouth every evening., Disp: 90 tablet, Rfl: 1 .  butalbital-acetaminophen-caffeine (FIORICET, ESGIC) 50-325-40 MG tablet, Take 1 tablet by mouth every 4 (four) hours as needed., Disp: , Rfl:  .  clonazePAM (KLONOPIN) 0.5 MG tablet, 0.25 qam, qnoon and 0.5 qhs, Disp: 10 tablet, Rfl: 0 .  escitalopram (LEXAPRO) 10 MG tablet, Take 1 tablet (10 mg total) by mouth daily., Disp: 90 tablet, Rfl: 1 .  hydrOXYzine (ATARAX/VISTARIL) 10 MG tablet, Take 1 tablet (10 mg total) by mouth every 8 (eight) hours as needed for anxiety., Disp: 90 tablet, Rfl: 0 .  nortriptyline (PAMELOR) 50 MG capsule, Take 1 capsule by mouth at bedtime., Disp: , Rfl:  .  promethazine (PHENERGAN) 25 MG tablet, Take 1 tablet (25 mg total) by mouth every 8 (eight) hours as needed., Disp: 30 tablet, Rfl: 0 .  topiramate  (TOPAMAX) 100 MG tablet, TAKE 1 TABLET(100 MG) BY MOUTH TWICE DAILY AFTER TITRATION COMPLETE, Disp: 60 tablet, Rfl: 0 .  Erenumab-aooe (AIMOVIG) 70 MG/ML SOAJ, Inject 70 mg into the skin every 30 (thirty) days., Disp: 1 mL, Rfl: 1  Allergies  Allergen Reactions  . Sumatriptan     crawling sensation in her body, flushed     ROS  Ten systems reviewed and is negative except as mentioned in HPI   Objective  Vitals:   09/16/17 0744  BP: 110/60  Pulse: 85  Resp: 14  SpO2: 98%  Weight: 242 lb 4.8 oz (109.9 kg)  Height: 5\' 3"  (1.6 m)    Body mass index is 42.92 kg/m.  Physical Exam  Constitutional: Patient appears well-developed and well-nourished. Obese  No distress.  HEENT: head atraumatic, normocephalic, pupils equal and reactive to light, neck supple, throat within normal limits Cardiovascular: Normal rate, regular rhythm and normal heart sounds.  No murmur heard. No BLE edema. Pulmonary/Chest: Effort normal and breath sounds normal. No respiratory distress. Abdominal: Soft.  There is no tenderness. Psychiatric: Patient has a normal mood and affect. behavior is normal. Judgment and thought content normal.  Neurological: no focal findings, normal strength, romberg negative  PHQ2/9: Depression screen [facility] 2/9 09/16/2017 07/29/2017 04/29/2017 10/27/2016 08/04/2016  Decreased Interest 1 0 2 0 0  Down, Depressed, Hopeless 1 0 2 0 0  PHQ - 2 Score 2 0 4 0 0  Altered sleeping 3 - 3 - -  Tired, decreased energy 2 - 3 - -  Change in appetite 0 - 3 - -  Feeling bad or failure about yourself  0 - 3 - -  Trouble concentrating 0 - 1 - -  Moving slowly or fidgety/restless 0 - 0 - -  Suicidal thoughts 0 - 0 - -  PHQ-9 Score 7 - 17 - -  Difficult doing work/chores Somewhat difficult - Very difficult - -     Fall Risk: Fall Risk  09/16/2017 07/29/2017 04/29/2017 10/27/2016 08/04/2016  Falls in the past year? No No No No No     Functional Status Survey: Is the patient deaf or have  difficulty hearing?: No Does the patient have difficulty seeing, even when wearing glasses/contacts?: No Does the patient have difficulty concentrating, remembering, or making decisions?: No Does the patient have difficulty walking or climbing stairs?: No Does the patient have difficulty dressing or bathing?: No Does the patient have difficulty doing errands alone such as visiting a doctor's office or shopping?: No    Assessment & Plan  1. Migraine with aura and without status migrainosus, not intractable  - Erenumab-aooe (AIMOVIG) 70 MG/ML SOAJ; Inject 70 mg into the skin every 30 (  thirty) days.  Dispense: 1 mL; Refill: 1 Advised to take 200 mg of Topamax at night ,since not taking it in am  Discussed possible side effects, she got first injection in our office today.   She sees neurologist but states for the past 2 weeks not feeling well, episodes are intense even with preventive medication  2. Anxiety, generalized  - clonazePAM (KLONOPIN) 0.5 MG tablet; 0.25 qam, qnoon and 0.5 qhs  Dispense: 10 tablet; Refill: 0

## 2017-09-17 LAB — CBC WITH DIFFERENTIAL/PLATELET
BASOS ABS: 50 {cells}/uL (ref 0–200)
Basophils Relative: 0.9 %
Eosinophils Absolute: 72 cells/uL (ref 15–500)
Eosinophils Relative: 1.3 %
HEMATOCRIT: 38.9 % (ref 35.0–45.0)
HEMOGLOBIN: 12.9 g/dL (ref 11.7–15.5)
LYMPHS ABS: 1414 {cells}/uL (ref 850–3900)
MCH: 28.2 pg (ref 27.0–33.0)
MCHC: 33.2 g/dL (ref 32.0–36.0)
MCV: 85.1 fL (ref 80.0–100.0)
MPV: 11.7 fL (ref 7.5–12.5)
Monocytes Relative: 7.5 %
NEUTROS ABS: 3553 {cells}/uL (ref 1500–7800)
NEUTROS PCT: 64.6 %
Platelets: 259 10*3/uL (ref 140–400)
RBC: 4.57 10*6/uL (ref 3.80–5.10)
RDW: 12.5 % (ref 11.0–15.0)
Total Lymphocyte: 25.7 %
WBC: 5.5 10*3/uL (ref 3.8–10.8)
WBCMIX: 413 {cells}/uL (ref 200–950)

## 2017-09-17 LAB — COMPLETE METABOLIC PANEL WITH GFR
AG RATIO: 1.6 (calc) (ref 1.0–2.5)
ALBUMIN MSPROF: 4.2 g/dL (ref 3.6–5.1)
ALT: 15 U/L (ref 6–29)
AST: 16 U/L (ref 10–30)
Alkaline phosphatase (APISO): 75 U/L (ref 33–115)
BUN: 12 mg/dL (ref 7–25)
CALCIUM: 8.9 mg/dL (ref 8.6–10.2)
CO2: 25 mmol/L (ref 20–32)
CREATININE: 0.67 mg/dL (ref 0.50–1.10)
Chloride: 107 mmol/L (ref 98–110)
GFR, EST AFRICAN AMERICAN: 127 mL/min/{1.73_m2} (ref >=60)
GFR, EST NON AFRICAN AMERICAN: 109 mL/min/{1.73_m2} (ref >=60)
GLOBULIN: 2.6 g/dL (ref 1.9–3.7)
Glucose, Bld: 98 mg/dL (ref 65–99)
POTASSIUM: 4.1 mmol/L (ref 3.5–5.3)
SODIUM: 139 mmol/L (ref 135–146)
Total Bilirubin: 0.6 mg/dL (ref 0.2–1.2)
Total Protein: 6.8 g/dL (ref 6.1–8.1)

## 2017-09-17 LAB — LIPID PANEL
CHOL/HDL RATIO: 4.3 (calc) (ref <5.0)
Cholesterol: 166 mg/dL (ref <200)
HDL: 39 mg/dL — ABNORMAL LOW (ref >50)
LDL CHOLESTEROL (CALC): 107 mg/dL — AB
NON-HDL CHOLESTEROL (CALC): 127 mg/dL (ref <130)
TRIGLYCERIDES: 106 mg/dL (ref <150)

## 2017-09-17 LAB — INSULIN, RANDOM: INSULIN: 7.3 u[IU]/mL (ref 2.0–19.6)

## 2017-09-17 LAB — HEMOGLOBIN A1C
EAG (MMOL/L): 5.5 (calc)
Hgb A1c MFr Bld: 5.1 % of total Hgb (ref <5.7)
Mean Plasma Glucose: 100 (calc)

## 2017-09-17 LAB — VITAMIN B12: Vitamin B-12: 256 pg/mL (ref 200–1100)

## 2017-09-17 LAB — TSH: TSH: 2.35 m[IU]/L

## 2017-09-17 LAB — VITAMIN D 25 HYDROXY (VIT D DEFICIENCY, FRACTURES): VIT D 25 HYDROXY: 12 ng/mL — AB (ref 30–100)

## 2017-09-18 ENCOUNTER — Telehealth: Payer: Self-pay | Admitting: Family Medicine

## 2017-09-18 NOTE — Telephone Encounter (Signed)
Copied from [place] [id number]. Topic: Quick Communication - See Telephone Encounter >> Sep 18, 2017  3:23 PM Boyd Kerbs wrote: CRM for notification. See Telephone encounter for:  09/18/17. Needs prior authorization and pharmacy said not covered by ins.  Has this been sent for pre-auth. Or does the doctor need to change it. Wants to know what she needs to do Please call

## 2017-09-18 NOTE — Telephone Encounter (Signed)
What medication is not covered.

## 2017-09-22 NOTE — Telephone Encounter (Signed)
PA has been performed and was denied since it was not documented that she has had more than 8 - 15 headaches within a month.  Denial was given to Dr. Ancil Boozer for review.

## 2017-09-23 NOTE — Telephone Encounter (Signed)
Thanks I will call and inform patient.

## 2017-09-24 ENCOUNTER — Encounter: Payer: Self-pay | Admitting: Family Medicine

## 2017-09-24 ENCOUNTER — Ambulatory Visit: Payer: 59 | Admitting: Family Medicine

## 2017-09-24 ENCOUNTER — Other Ambulatory Visit: Payer: Self-pay | Admitting: *Deleted

## 2017-09-24 VITALS — BP 112/82 | HR 110 | Temp 98.0°F | Resp 18 | Ht 63.0 in | Wt 238.7 lb

## 2017-09-24 DIAGNOSIS — G43111 Migraine with aura, intractable, with status migrainosus: Secondary | ICD-10-CM

## 2017-09-24 DIAGNOSIS — D518 Other vitamin B12 deficiency anemias: Secondary | ICD-10-CM | POA: Diagnosis not present

## 2017-09-24 DIAGNOSIS — E559 Vitamin D deficiency, unspecified: Secondary | ICD-10-CM | POA: Diagnosis not present

## 2017-09-24 MED ORDER — CYANOCOBALAMIN 1000 MCG/ML IJ SOLN
1000.0000 ug | Freq: Once | INTRAMUSCULAR | Status: AC
  Administered 2017-09-24: 1000 ug via INTRAMUSCULAR

## 2017-09-24 MED ORDER — PREDNISONE 5 MG (21) PO TBPK: ORAL_TABLET | ORAL | 0 refills | Status: DC

## 2017-09-24 MED ORDER — VITAMIN D (ERGOCALCIFEROL) 1.25 MG (50000 UNIT) PO CAPS: 50000.0000 [IU] | ORAL_CAPSULE | ORAL | 0 refills | Status: DC

## 2017-09-24 MED ORDER — KETOROLAC TROMETHAMINE 60 MG/2ML IM SOLN
60.0000 mg | Freq: Once | INTRAMUSCULAR | Status: AC
  Administered 2017-09-24: 60 mg via INTRAMUSCULAR

## 2017-09-24 MED ORDER — PROMETHAZINE HCL 25 MG/ML IJ SOLN
25.0000 mg | Freq: Once | INTRAMUSCULAR | Status: AC
  Administered 2017-09-24: 25 mg via INTRAMUSCULAR

## 2017-09-24 NOTE — Progress Notes (Signed)
Name: Sally Mueller   MRN: [id number]    DOB: 1976-07-16   Date:09/24/2017       Progress Note  Subjective  Chief Complaint  Chief Complaint  Patient presents with  . Migraine    Onset-yesterday, patient took Axert yesterday morning and then went to work. Then came home and took Fioricet and Phenergan-with no relief. Unable to sleep due to pain. Insurance does not cover Migraine injectable medication.    HPI   Migraine intractable: she was seen 09/16/2017 and tried Aimovig but did not work and is not covered by Insurance underwriter. She states she has daily headache and over the past 24 hours migraine episode. She woke up with a severe episode - took Axert and promethazine because of nausea and aura ( visual flashes)  but is still having symptoms. She has sharp pain behind left eye that radiates to nuchal area. Denies photophobia at this time or phonophobia. She has intermittent visual changes that lasts seconds. It feels like her typical migraine, she wants relieve, she went to work yesterday but missed today. Pain is 9/10   Patient Active Problem List   Diagnosis Date Noted  . Posterior neck pain 08/30/2015  . IBS (irritable bowel syndrome) 07/12/2015  . Anxiety, generalized 05/17/2015  . Headache disorder 05/17/2015  . Dysmenorrhea 05/17/2015  . Dysmetabolic syndrome 33/82/5053  . Migraine with aura and without status migrainosus 05/17/2015  . Extreme obesity 05/17/2015    Past Surgical History:  Procedure Laterality Date  . CESAREAN SECTION  2000, 2002  . CHOLECYSTECTOMY  2008?  [name] LIPOMA EXCISION  05/01/2014  . TUBAL LIGATION  2002  . TUMOR REMOVAL  2012?    Family History  Problem Relation Age of Onset  . Cardiomyopathy Mother   . Hypertension Mother   . Drug abuse Mother   . Hyperlipidemia Father   . Alcohol abuse Sister   . Drug abuse Sister   . Bipolar disorder Sister   . Schizophrenia Sister   . Breast cancer Neg Hx     Social History   Socioeconomic History  .  Marital status: Single    Spouse name: Not on file  . Number of children: 3  . Years of education: Not on file  . Highest education level: Not on file  Social Needs  . Financial resource strain: Not on file  . Food insecurity - worry: Not on file  . Food insecurity - inability: Not on file  . Transportation needs - medical: Not on file  . Transportation needs - non-medical: Not on file  Occupational History  . Occupation: Pharmacist, hospital  Tobacco Use  . Smoking status: Former Smoker    Years: 8.00    Types: Cigarettes    Start date: 11/17/1993    Last attempt to quit: 11/17/2001    Years since quitting: 15.8  . Smokeless tobacco: Never Used  Substance and Sexual Activity  . Alcohol use: No    Alcohol/week: 0.0 oz  . Drug use: No  . Sexual activity: Yes    Partners: Male    Birth control/protection: Other-see comments    Comment: Tubal Ligation  Other Topics Concern  . Not on file  Social History Narrative   She graduated from school of education      Current Outpatient Medications:  .  almotriptan (AXERT) 12.5 MG tablet, TAKE 1 TABLET BY MOUTH AT ONSET OF MIGRAINE, Disp: 9 tablet, Rfl: 0 .  atenolol (TENORMIN) 25 MG tablet, Take 1 tablet (25 mg  total) by mouth every evening., Disp: 90 tablet, Rfl: 1 .  butalbital-acetaminophen-caffeine (FIORICET, ESGIC) 50-325-40 MG tablet, Take 1 tablet by mouth every 4 (four) hours as needed., Disp: , Rfl:  .  clonazePAM (KLONOPIN) 0.5 MG tablet, 0.25 qam, qnoon and 0.5 qhs, Disp: 10 tablet, Rfl: 0 .  escitalopram (LEXAPRO) 10 MG tablet, Take 1 tablet (10 mg total) by mouth daily., Disp: 90 tablet, Rfl: 1 .  hydrOXYzine (ATARAX/VISTARIL) 10 MG tablet, Take 1 tablet (10 mg total) by mouth every 8 (eight) hours as needed for anxiety., Disp: 90 tablet, Rfl: 0 .  nortriptyline (PAMELOR) 50 MG capsule, Take 1 capsule by mouth at bedtime., Disp: , Rfl:  .  promethazine (PHENERGAN) 25 MG tablet, Take 1 tablet (25 mg total) by mouth every 8 (eight) hours as  needed., Disp: 30 tablet, Rfl: 0 .  topiramate (TOPAMAX) 100 MG tablet, TAKE 1 TABLET(100 MG) BY MOUTH TWICE DAILY AFTER TITRATION COMPLETE, Disp: 60 tablet, Rfl: 0 .  Vitamin D, Ergocalciferol, (DRISDOL) 50000 units CAPS capsule, Take 1 capsule (50,000 Units total) every 7 (seven) days by mouth., Disp: 12 capsule, Rfl: 0  Current Facility-Administered Medications:  .  cyanocobalamin ((VITAMIN B-12)) injection 1,000 mcg, 1,000 mcg, Intramuscular, Once, Nishanth Mccaughan, Drue Stager, MD .  ketorolac (TORADOL) injection 60 mg, 60 mg, Intramuscular, Once, Danylle Ouk, Drue Stager, MD .  promethazine (PHENERGAN) injection 25 mg, 25 mg, Intramuscular, Once, Steele Sizer, MD  Allergies  Allergen Reactions  . Sumatriptan     crawling sensation in her body, flushed     ROS  Ten systems reviewed and is negative except as mentioned in HPI   Objective  Vitals:   09/24/17 1051  BP: 112/82  Pulse: (!) 110  Resp: 18  Temp: 98 F (36.7 C)  TempSrc: Oral  SpO2: 96%  Weight: 238 lb 11.2 oz (108.3 kg)  Height: 5\' 3"  (1.6 m)    Body mass index is 42.28 kg/m.  Physical Exam  Constitutional: Patient appears well-developed and well-nourished. Obese  No distress.  HEENT: head atraumatic, normocephalic, pupils equal and reactive to light,  neck supple, throat within normal limits Cardiovascular: Normal rate, regular rhythm and normal heart sounds.  No murmur heard. No BLE edema. Pulmonary/Chest: Effort normal and breath sounds normal. No respiratory distress. Abdominal: Soft.  There is no tenderness. Psychiatric: Patient has a normal mood and affect. behavior is normal. Judgment and thought content normal. Neurologist: no focal findings. Cranial nerves intact., romberg negative   Recent Results (from the past 2160 hour(s))  CBC with Differential/Platelet     Status: None   Collection Time: 09/16/17  8:49 AM  Result Value Ref Range   WBC 5.5 3.8 - 10.8 Thousand/uL   RBC 4.57 3.80 - 5.10 Million/uL    Hemoglobin 12.9 11.7 - 15.5 g/dL   HCT 38.9 35.0 - 45.0 %   MCV 85.1 80.0 - 100.0 fL   MCH 28.2 27.0 - 33.0 pg   MCHC 33.2 32.0 - 36.0 g/dL   RDW 12.5 11.0 - 15.0 %   Platelets 259 140 - 400 Thousand/uL   MPV 11.7 7.5 - 12.5 fL   Neutro Abs 3,553 1,500 - 7,800 cells/uL   Lymphs Abs 1,414 850 - 3,900 cells/uL   WBC mixed population 413 200 - 950 cells/uL   Eosinophils Absolute 72 15 - 500 cells/uL   Basophils Absolute 50 0 - 200 cells/uL   Neutrophils Relative % 64.6 %   Total Lymphocyte 25.7 %   Monocytes Relative 7.5 %  Eosinophils Relative 1.3 %   Basophils Relative 0.9 %  COMPLETE METABOLIC PANEL WITH GFR     Status: None   Collection Time: 09/16/17  8:49 AM  Result Value Ref Range   Glucose, Bld 98 65 - 99 mg/dL    Comment: .            Fasting reference interval .    BUN 12 7 - 25 mg/dL   Creat 0.67 0.50 - 1.10 mg/dL   GFR, Est Non African American 109 > OR = 60 mL/min/1.89m2   GFR, Est African American 127 > OR = 60 mL/min/1.41m2   BUN/Creatinine Ratio NOT APPLICABLE 6 - 22 (calc)   Sodium 139 135 - 146 mmol/L   Potassium 4.1 3.5 - 5.3 mmol/L   Chloride 107 98 - 110 mmol/L   CO2 25 20 - 32 mmol/L   Calcium 8.9 8.6 - 10.2 mg/dL   Total Protein 6.8 6.1 - 8.1 g/dL   Albumin 4.2 3.6 - 5.1 g/dL   Globulin 2.6 1.9 - 3.7 g/dL (calc)   AG Ratio 1.6 1.0 - 2.5 (calc)   Total Bilirubin 0.6 0.2 - 1.2 mg/dL   Alkaline phosphatase (APISO) 75 33 - 115 U/L   AST 16 10 - 30 U/L   ALT 15 6 - 29 U/L  Hemoglobin A1c     Status: None   Collection Time: 09/16/17  8:49 AM  Result Value Ref Range   Hgb A1c MFr Bld 5.1 <5.7 % of total Hgb    Comment: For the purpose of screening for the presence of diabetes: . <5.7%       Consistent with the absence of diabetes 5.7-6.4%    Consistent with increased risk for diabetes             (prediabetes) > or =6.5%  Consistent with diabetes . This assay result is consistent with a decreased risk of diabetes. . Currently, no consensus  exists regarding use of hemoglobin A1c for diagnosis of diabetes in children. . According to American Diabetes Association (ADA) guidelines, hemoglobin A1c <7.0% represents optimal control in non-pregnant diabetic patients. Different metrics may apply to specific patient populations.  Standards of Medical Care in Diabetes(ADA). .    Mean Plasma Glucose 100 (calc)   eAG (mmol/L) 5.5 (calc)  Vitamin B12     Status: None   Collection Time: 09/16/17  8:49 AM  Result Value Ref Range   Vitamin B-12 256 200 - 1,100 pg/mL    Comment: . Please Note: Although the reference range for vitamin B12 is [phone] pg/mL, it has been reported that between 5 and 10% of patients with values between 200 and 400 pg/mL may experience neuropsychiatric and hematologic abnormalities due to occult B12 deficiency; less than 1% of patients with values above 400 pg/mL will have symptoms. [name]   VITAMIN D 25 Hydroxy (Vit-D Deficiency, Fractures)     Status: Abnormal   Collection Time: 09/16/17  8:49 AM  Result Value Ref Range   Vit D, 25-Hydroxy 12 (L) 30 - 100 ng/mL    Comment: Vitamin D Status         25-OH Vitamin D: . Deficiency:                    <20 ng/mL Insufficiency:             20 - 29 ng/mL Optimal:                 >  or = 30 ng/mL . For 25-OH Vitamin D testing on patients on  D2-supplementation and patients for whom quantitation  of D2 and D3 fractions is required, the QuestAssureD(TM) 25-OH VIT D, (D2,D3), LC/MS/MS is recommended: order  code [id number] (patients >74yrs). . For more information on this test, go to: http://education.questdiagnostics.com/faq/FAQ163 (This link is being provided for  informational/educational purposes only.)   TSH     Status: None   Collection Time: 09/16/17  8:49 AM  Result Value Ref Range   TSH 2.35 mIU/L    Comment:           Reference Range .           > or = 20 Years  0.40-4.50 .                Pregnancy Ranges           First trimester    0.26-2.66            Second trimester   0.55-2.73           Third trimester    0.43-2.91   Lipid panel     Status: Abnormal   Collection Time: 09/16/17  8:49 AM  Result Value Ref Range   Cholesterol 166 <200 mg/dL   HDL 39 (L) >50 mg/dL   Triglycerides 106 <150 mg/dL   LDL Cholesterol (Calc) 107 (H) mg/dL (calc)    Comment: Reference range: <100 . Desirable range <100 mg/dL for primary prevention;   <70 mg/dL for patients with CHD or diabetic patients  with > or = 2 CHD risk factors. [name] LDL-C is now calculated using the Martin-Hopkins  calculation, which is a validated novel method providing  better accuracy than the Friedewald equation in the  estimation of LDL-C.  Cresenciano Genre et al. Annamaria Helling. 4970;263(78): 2061-2068  (http://education.[facility].com/faq/FAQ164)    Total CHOL/HDL Ratio 4.3 <5.0 (calc)   Non-HDL Cholesterol (Calc) 127 <130 mg/dL (calc)    Comment: For patients with diabetes plus 1 major ASCVD risk  factor, treating to a non-HDL-C goal of <100 mg/dL  (LDL-C of <70 mg/dL) is considered a therapeutic  option.   Insulin, random     Status: None   Collection Time: 09/16/17  8:49 AM  Result Value Ref Range   Insulin 7.3 2.0 - 19.6 uIU/mL    Comment: This insulin assay shows strong cross-reactivity for some insulin analogs (lispro, aspart, and glargine) and much lower cross-reactivity with others (detemir, glulisine).       PHQ2/9: Depression screen [facility] 2/9 09/16/2017 07/29/2017 04/29/2017 10/27/2016 08/04/2016  Decreased Interest 1 0 2 0 0  Down, Depressed, Hopeless 1 0 2 0 0  PHQ - 2 Score 2 0 4 0 0  Altered sleeping 3 - 3 - -  Tired, decreased energy 2 - 3 - -  Change in appetite 0 - 3 - -  Feeling bad or failure about yourself  0 - 3 - -  Trouble concentrating 0 - 1 - -  Moving slowly or fidgety/restless 0 - 0 - -  Suicidal thoughts 0 - 0 - -  PHQ-9 Score 7 - 17 - -  Difficult doing work/chores Somewhat difficult - Very difficult - -     Fall Risk: Fall Risk   09/16/2017 07/29/2017 04/29/2017 10/27/2016 08/04/2016  Falls in the past year? No No No No No     Assessment & Plan  1. Intractable migraine with aura with status migrainosus  Discussed importance of going to  EC if symptoms get worse or do not improve - ketorolac (TORADOL) injection 60 mg - promethazine (PHENERGAN) injection 25 mg  We will try prednisone taper, and advised to call Neurologist also   2. Other vitamin B12 deficiency anemia  - cyanocobalamin ((VITAMIN B-12)) injection 1,000 mcg  3. Vitamin D deficiency  - Vitamin D, Ergocalciferol, (DRISDOL) 50000 units CAPS capsule; Take 1 capsule (50,000 Units total) every 7 (seven) days by mouth.  Dispense: 12 capsule; Refill: 0

## 2017-10-12 ENCOUNTER — Ambulatory Visit: Payer: Self-pay | Admitting: Family Medicine

## 2017-10-15 ENCOUNTER — Other Ambulatory Visit: Payer: Self-pay | Admitting: Family Medicine

## 2017-10-15 DIAGNOSIS — F411 Generalized anxiety disorder: Secondary | ICD-10-CM

## 2017-10-15 NOTE — Telephone Encounter (Signed)
Refill request for general medication: Hydroxyzine to [facility].  Last office visit: 09/24/2017  No Follow-up on file.

## 2017-12-21 ENCOUNTER — Other Ambulatory Visit: Payer: Self-pay | Admitting: Family Medicine

## 2017-12-21 NOTE — Telephone Encounter (Signed)
Copied from [place] [id number]. Topic: Quick Communication - Rx Refill/Question >> Dec 21, 2017  2:59 PM Scherrie Gerlach wrote: Medication: topiramate (TOPAMAX) 100 MG tablet Pt has an appt 2/11 but has run completely out of her topamax. Pt hoping to get enough to get her through to this appt [facility] [place], [address] [facility] [address] [phone] (Phone) [phone] (Fax)

## 2017-12-21 NOTE — Telephone Encounter (Signed)
Refill request for general medication: Topamax 100 mg  Last office visit: 09/24/2017  Last physical exam: None indicated  Follow-up on file. 12/28/2017

## 2017-12-28 ENCOUNTER — Ambulatory Visit: Payer: 59 | Admitting: Family Medicine

## 2017-12-28 ENCOUNTER — Encounter: Payer: Self-pay | Admitting: Family Medicine

## 2017-12-28 VITALS — BP 122/80 | HR 116 | Temp 97.9°F | Resp 16 | Ht 63.0 in | Wt 239.0 lb

## 2017-12-28 DIAGNOSIS — R519 Headache, unspecified: Secondary | ICD-10-CM

## 2017-12-28 DIAGNOSIS — F320 Major depressive disorder, single episode, mild: Secondary | ICD-10-CM

## 2017-12-28 DIAGNOSIS — R51 Headache: Secondary | ICD-10-CM

## 2017-12-28 DIAGNOSIS — D518 Other vitamin B12 deficiency anemias: Secondary | ICD-10-CM | POA: Diagnosis not present

## 2017-12-28 DIAGNOSIS — F411 Generalized anxiety disorder: Secondary | ICD-10-CM

## 2017-12-28 DIAGNOSIS — F32A Depression, unspecified: Secondary | ICD-10-CM

## 2017-12-28 DIAGNOSIS — E8881 Metabolic syndrome: Secondary | ICD-10-CM

## 2017-12-28 DIAGNOSIS — G43109 Migraine with aura, not intractable, without status migrainosus: Secondary | ICD-10-CM | POA: Diagnosis not present

## 2017-12-28 MED ORDER — TOPIRAMATE 100 MG PO TABS: ORAL_TABLET | ORAL | 1 refills | Status: DC

## 2017-12-28 MED ORDER — ESCITALOPRAM OXALATE 10 MG PO TABS: 10.0000 mg | ORAL_TABLET | Freq: Every day | ORAL | 1 refills | Status: DC

## 2017-12-28 MED ORDER — PROMETHAZINE HCL 25 MG PO TABS: 25.0000 mg | ORAL_TABLET | Freq: Three times a day (TID) | ORAL | 0 refills | Status: DC | PRN

## 2017-12-28 MED ORDER — CLONAZEPAM 0.5 MG PO TABS
ORAL_TABLET | ORAL | 0 refills | Status: DC
Start: 2017-12-28 — End: 2017-12-29

## 2017-12-28 MED ORDER — BUTALBITAL-APAP-CAFFEINE 50-325-40 MG PO TABS: 1.0000 | ORAL_TABLET | ORAL | 0 refills | Status: DC | PRN

## 2017-12-28 NOTE — Progress Notes (Signed)
Name: Sally Mueller   MRN: [id number]    DOB: [birth year]   Date:12/28/2017       Progress Note  Subjective  Chief Complaint  Chief Complaint  Patient presents with  . Medication Refill  . Anxiety  . Migraine    HPI  Migraine and headache constant: seen by  Dr. Melrose Nakayama, she is doing better now, episodes about once to twice a month. She has one before her cycles. She states sometimes takes promethazine at night when migraine is about to start prior to bedtime. She is no longer going to see Dr. Melrose Nakayama. She is pleased with Topamax. She is no longer taking daily pain medication. She had a just a few Fioricets only taking it seldom now, advised to drink fluids, try naps and cold compresses prior to taking pain medication for daily headache.   GAD: we changed from Lexapro to Effexor one month ago, but she continues to have anxiety attacks occasionally, feels overwhelmed by minor things and she feels very tired after an episode. She finished school August 2017, graduated Dec 2017. She is in an admnistrative lead position . Stopped Xanax XR advised by Dr. Maricela Curet and was given Klonopin, however to take only prn She takes Klonopin 10 pills for 3 months, but feels like not enough, we will try 5 per month, she states worse struggle is going groceries shopping.  Advised her to see a counselor, she does not want to go back to psychiatrist. She is still under stress, mostly about her son Rolla Plate - doing drugs, skipping school.    Metabolic syndrome: she denies polyphagia, polydipsia or polyuria, increase in abdominal girth.    Patient Active Problem List   Diagnosis Date Noted  . Posterior neck pain 08/30/2015  . IBS (irritable bowel syndrome) 07/12/2015  . Anxiety, generalized 05/17/2015  . Headache disorder 05/17/2015  . Dysmenorrhea 05/17/2015  . Dysmetabolic syndrome 99/24/2683  . Migraine with aura and without status migrainosus 05/17/2015  . Extreme obesity 05/17/2015    Past Surgical History:   Procedure Laterality Date  . CESAREAN SECTION  2000, 2002  . CHOLECYSTECTOMY  2008?  [name] LIPOMA EXCISION  05/01/2014  . TUBAL LIGATION  2002  . TUMOR REMOVAL  2012?    Family History  Problem Relation Age of Onset  . Cardiomyopathy Mother   . Hypertension Mother   . Drug abuse Mother   . Hyperlipidemia Father   . Alcohol abuse Sister   . Drug abuse Sister   . Bipolar disorder Sister   . Schizophrenia Sister   . Breast cancer Neg Hx     Social History   Socioeconomic History  . Marital status: Single    Spouse name: Not on file  . Number of children: 3  . Years of education: Not on file  . Highest education level: Not on file  Social Needs  . Financial resource strain: Not on file  . Food insecurity - worry: Not on file  . Food insecurity - inability: Not on file  . Transportation needs - medical: Not on file  . Transportation needs - non-medical: Not on file  Occupational History  . Occupation: Pharmacist, hospital  Tobacco Use  . Smoking status: Former Smoker    Years: 8.00    Types: Cigarettes    Start date: 11/17/1993    Last attempt to quit: 11/17/2001    Years since quitting: 16.1  . Smokeless tobacco: Never Used  Substance and Sexual Activity  . Alcohol use: No  Alcohol/week: 0.0 oz  . Drug use: No  . Sexual activity: Yes    Partners: Male    Birth control/protection: Other-see comments    Comment: Tubal Ligation  Other Topics Concern  . Not on file  Social History Narrative   She graduated from school of education      Current Outpatient Medications:  .  almotriptan (AXERT) 12.5 MG tablet, TAKE 1 TABLET BY MOUTH AT ONSET OF MIGRAINE, Disp: 9 tablet, Rfl: 0 .  butalbital-acetaminophen-caffeine (FIORICET, ESGIC) 50-325-40 MG tablet, Take 1 tablet by mouth every 4 (four) hours as needed., Disp: , Rfl:  .  clonazePAM (KLONOPIN) 0.5 MG tablet, 0.25 qam, qnoon and 0.5 qhs, Disp: 10 tablet, Rfl: 0 .  escitalopram (LEXAPRO) 10 MG tablet, Take 1 tablet (10 mg total) by  mouth daily., Disp: 90 tablet, Rfl: 1 .  hydrOXYzine (ATARAX/VISTARIL) 10 MG tablet, TAKE 1 TABLET(10 MG) BY MOUTH EVERY 8 HOURS AS NEEDED FOR ANXIETY, Disp: 90 tablet, Rfl: 0 .  nortriptyline (PAMELOR) 50 MG capsule, Take 1 capsule by mouth at bedtime., Disp: , Rfl:  .  promethazine (PHENERGAN) 25 MG tablet, Take 1 tablet (25 mg total) by mouth every 8 (eight) hours as needed., Disp: 30 tablet, Rfl: 0 .  topiramate (TOPAMAX) 100 MG tablet, TAKE 1 TABLET(100 MG) BY MOUTH TWICE DAILY AFTER TITRATION COMPLETE, Disp: 60 tablet, Rfl: 0 .  Vitamin D, Ergocalciferol, (DRISDOL) 50000 units CAPS capsule, Take 1 capsule (50,000 Units total) every 7 (seven) days by mouth., Disp: 12 capsule, Rfl: 0 .  atenolol (TENORMIN) 25 MG tablet, Take 1 tablet (25 mg total) by mouth every evening. (Patient not taking: Reported on 12/28/2017), Disp: 90 tablet, Rfl: 1  Allergies  Allergen Reactions  . Sumatriptan     crawling sensation in her body, flushed     ROS  Constitutional: Negative for fever or weight change.  Respiratory: Negative for cough and shortness of breath.   Cardiovascular: Negative for chest pain or palpitations.  Gastrointestinal: Negative for abdominal pain, no bowel changes.  Musculoskeletal: Negative for gait problem or joint swelling.  Skin: Negative for rash.  Neurological: Negative for dizziness , positive for intermittent  headache.  No other specific complaints in a complete review of systems (except as listed in HPI above).  Objective  Vitals:   12/28/17 1502  BP: 122/80  Pulse: (!) 116  Resp: 16  Temp: 97.9 F (36.6 C)  TempSrc: Oral  SpO2: 97%  Weight: 239 lb (108.4 kg)  Height: 5\' 3"  (1.6 m)    Body mass index is 42.34 kg/m.  Physical Exam  Constitutional: Patient appears well-developed and well-nourished. Obese  No distress.  HEENT: head atraumatic, normocephalic, pupils equal and reactive to light, neck supple, throat within normal limits Cardiovascular: Normal  rate, regular rhythm and normal heart sounds.  No murmur heard. No BLE edema. Pulmonary/Chest: Effort normal and breath sounds normal. No respiratory distress. Abdominal: Soft.  There is no tenderness. Psychiatric: Patient has a normal mood and affect. behavior is normal. Judgment and thought content normal.  PHQ2/9: Depression screen [facility] 2/9 12/28/2017 09/16/2017 07/29/2017 04/29/2017 10/27/2016  Decreased Interest 0 1 0 2 0  Down, Depressed, Hopeless 0 1 0 2 0  PHQ - 2 Score 0 2 0 4 0  Altered sleeping - 3 - 3 -  Tired, decreased energy - 2 - 3 -  Change in appetite - 0 - 3 -  Feeling bad or failure about yourself  - 0 - 3 -  Trouble concentrating - 0 - 1 -  Moving slowly or fidgety/restless - 0 - 0 -  Suicidal thoughts - 0 - 0 -  PHQ-9 Score - 7 - 17 -  Difficult doing work/chores - Somewhat difficult - Very difficult -     Fall Risk: Fall Risk  12/28/2017 09/16/2017 07/29/2017 04/29/2017 10/27/2016  Falls in the past year? No No No No No      Functional Status Survey: Is the patient deaf or have difficulty hearing?: No Does the patient have difficulty seeing, even when wearing glasses/contacts?: No Does the patient have difficulty concentrating, remembering, or making decisions?: No Does the patient have difficulty walking or climbing stairs?: No Does the patient have difficulty dressing or bathing?: No Does the patient have difficulty doing errands alone such as visiting a doctor's office or shopping?: No    Assessment & Plan  1. Migraine with aura and without status migrainosus, not intractable  Doing much better on Topamax  - topiramate (TOPAMAX) 100 MG tablet; TAKE 1 TABLET(100 MG) BY MOUTH TWICE DAILY AFTER TITRATION COMPLETE  Dispense: 180 tablet; Refill: 1 - promethazine (PHENERGAN) 25 MG tablet; Take 1 tablet (25 mg total) by mouth every 8 (eight) hours as needed.  Dispense: 30 tablet; Refill: 0  2. Anxiety, generalized  - escitalopram (LEXAPRO) 10 MG tablet;  Take 1 tablet (10 mg total) by mouth daily.  Dispense: 90 tablet; Refill: 1 - clonazePAM (KLONOPIN) 0.5 MG tablet; 0.25 qam, qnoon and 0.5 qhs  Dispense: 15 tablet; Refill: 0  3. Other vitamin B12 deficiency anemia  Continue B12 injection   4. Dysmetabolic syndrome  On life style modification   5. Mild depression ([place])  She worries about her youngest son, doing drugs, skipping school  6. Morbid obesity, unspecified obesity type [facility])  Discussed with the patient the risk posed by an increased BMI. Discussed importance of portion control, calorie counting and at least 150 minutes of physical activity weekly. Avoid sweet beverages and drink more water. Eat at least 6 servings of fruit and vegetables daily   7. Headache disorder  Doing better, only taking Fioricet prn, last filled 08/2017 by neurologist only 15 pills.  - butalbital-acetaminophen-caffeine (FIORICET, ESGIC) 50-325-40 MG tablet; Take 1 tablet by mouth every 4 (four) hours as needed.  Dispense: 30 tablet; Refill: 0

## 2017-12-29 ENCOUNTER — Other Ambulatory Visit: Payer: Self-pay | Admitting: Family Medicine

## 2017-12-29 ENCOUNTER — Telehealth: Payer: Self-pay | Admitting: Family Medicine

## 2017-12-29 DIAGNOSIS — F411 Generalized anxiety disorder: Secondary | ICD-10-CM

## 2017-12-29 MED ORDER — CLONAZEPAM 0.5 MG PO TABS: ORAL_TABLET | ORAL | 0 refills | Status: DC

## 2017-12-29 NOTE — Telephone Encounter (Signed)
Please explain what size portion she should take in the afternoon?

## 2017-12-29 NOTE — Telephone Encounter (Signed)
Correction made to prescription, one po daily prn

## 2017-12-29 NOTE — Telephone Encounter (Signed)
Copied from [place] [id number]. Topic: General - Other >> Dec 29, 2017  3:51 PM Lolita Rieger, [place] wrote: Reason for CRM: Katlin from [facility] called and needs clarification on clonazepam prescription please call [phone]

## 2017-12-30 NOTE — Telephone Encounter (Signed)
Please sign prescription and I will fax over to [facility].

## 2018-01-13 ENCOUNTER — Other Ambulatory Visit: Payer: Self-pay | Admitting: Family Medicine

## 2018-01-13 DIAGNOSIS — E559 Vitamin D deficiency, unspecified: Secondary | ICD-10-CM

## 2018-01-13 NOTE — Telephone Encounter (Signed)
Refill request for general medication: Vitamin D 50,000 units  Last office visit: 12/28/2017  Last physical exam: 10/03/2016  Follow-up on file. 03/30/2018

## 2018-01-21 NOTE — Telephone Encounter (Signed)
Copied from [place]. Topic: Quick Communication - See Telephone Encounter >> Jan 21, 2018  3:50 PM Sally Mueller wrote: Almost 3 week headache cycle.  Pt had this last time and was prescribed a steroid pack. Pt is asking if she can have this called in.

## 2018-01-22 ENCOUNTER — Other Ambulatory Visit: Payer: Self-pay | Admitting: Family Medicine

## 2018-01-22 MED ORDER — PREDNISONE 20 MG PO TABS: 20.0000 mg | ORAL_TABLET | Freq: Two times a day (BID) | ORAL | 0 refills | Status: DC

## 2018-01-22 NOTE — Telephone Encounter (Signed)
Sent 4 pills

## 2018-01-25 ENCOUNTER — Encounter: Payer: Self-pay | Admitting: Family Medicine

## 2018-02-05 ENCOUNTER — Ambulatory Visit: Payer: 59 | Admitting: Family Medicine

## 2018-02-05 ENCOUNTER — Encounter: Payer: Self-pay | Admitting: Family Medicine

## 2018-02-05 VITALS — BP 124/72 | HR 100 | Temp 98.8°F | Resp 18 | Ht 63.0 in | Wt 239.1 lb

## 2018-02-05 DIAGNOSIS — G43109 Migraine with aura, not intractable, without status migrainosus: Secondary | ICD-10-CM

## 2018-02-05 DIAGNOSIS — J069 Acute upper respiratory infection, unspecified: Secondary | ICD-10-CM

## 2018-02-05 MED ORDER — KETOROLAC TROMETHAMINE 60 MG/2ML IM SOLN
30.0000 mg | Freq: Once | INTRAMUSCULAR | Status: AC
  Administered 2018-02-05: 30 mg via INTRAMUSCULAR

## 2018-02-05 MED ORDER — TRIAMCINOLONE ACETONIDE 40 MG/ML IJ SUSP
40.0000 mg | Freq: Once | INTRAMUSCULAR | Status: AC
  Administered 2018-02-05: 40 mg via INTRAMUSCULAR

## 2018-02-05 MED ORDER — KETOROLAC TROMETHAMINE 30 MG/ML IJ SOLN: 30.0000 mg | Freq: Once | INTRAMUSCULAR | Status: DC

## 2018-02-05 MED ORDER — MAGIC MOUTHWASH W/LIDOCAINE: 5.0000 mL | Freq: Three times a day (TID) | ORAL | 0 refills | Status: DC | PRN

## 2018-02-05 MED ORDER — PREDNISONE 20 MG PO TABS: 20.0000 mg | ORAL_TABLET | Freq: Two times a day (BID) | ORAL | 0 refills | Status: DC

## 2018-02-05 NOTE — Patient Instructions (Addendum)
Flonase - please use daily. Drink plenty of water.  Cool Mist Vaporizer A cool mist vaporizer is a device that releases a cool mist into the air. If you have a cough or a cold, using a vaporizer may help relieve your symptoms. The mist adds moisture to the air, which may help thin your mucus and make it less sticky. When your mucus is thin and less sticky, it easier for you to breathe and to cough up secretions. Do not use a vaporizer if you are allergic to mold. Follow these instructions at home:  Follow the instructions that come with the vaporizer.  Do not use anything other than distilled water in the vaporizer.  Do not run the vaporizer all of the time. Doing that can cause mold or bacteria to grow in the vaporizer.  Clean the vaporizer after each time that you use it.  Clean and dry the vaporizer well before storing it.  Stop using the vaporizer if your breathing symptoms get worse. This information is not intended to replace advice given to you by your health care provider. Make sure you discuss any questions you have with your health care provider. Document Released: 07/31/2004 Document Revised: 05/23/2016 Document Reviewed: 02/02/2016 Elsevier Interactive Patient Education  2018 [employer].   Upper Respiratory Infection, Adult Most upper respiratory infections (URIs) are caused by a virus. A URI affects the nose, throat, and upper air passages. The most common type of URI is often called "the common cold." Follow these instructions at home:  Take medicines only as told by your doctor.  Gargle warm saltwater or take cough drops to comfort your throat as told by your doctor.  Use a warm mist humidifier or inhale steam from a shower to increase air moisture. This may make it easier to breathe.  Drink enough fluid to keep your pee (urine) clear or pale yellow.  Eat soups and other clear broths.  Have a healthy diet.  Rest as needed.  Go back to work when your fever  is gone or your doctor says it is okay. ? You may need to stay home longer to avoid giving your URI to others. ? You can also wear a face mask and wash your hands often to prevent spread of the virus.  Use your inhaler more if you have asthma.  Do not use any tobacco products, including cigarettes, chewing tobacco, or electronic cigarettes. If you need help quitting, ask your doctor. Contact a doctor if:  You are getting worse, not better.  Your symptoms are not helped by medicine.  You have chills.  You are getting more short of breath.  You have brown or red mucus.  You have yellow or brown discharge from your nose.  You have pain in your face, especially when you bend forward.  You have a fever.  You have puffy (swollen) neck glands.  You have pain while swallowing.  You have white areas in the back of your throat. Get help right away if:  You have very bad or constant: ? Headache. ? Ear pain. ? Pain in your forehead, behind your eyes, and over your cheekbones (sinus pain). ? Chest pain.  You have long-lasting (chronic) lung disease and any of the following: ? Wheezing. ? Long-lasting cough. ? Coughing up blood. ? A change in your usual mucus.  You have a stiff neck.  You have changes in your: ? Vision. ? Hearing. ? Thinking. ? Mood. This information is not intended to replace  advice given to you by your health care provider. Make sure you discuss any questions you have with your health care provider. Document Released: 04/21/2008 Document Revised: 07/06/2016 Document Reviewed: 02/08/2014 Elsevier Interactive Patient Education  2018 [employer].

## 2018-02-05 NOTE — Progress Notes (Signed)
Name: Sally Mueller   MRN: [id number]    DOB: 1976-02-21   Date:02/05/2018       Progress Note  Subjective  Chief Complaint  Chief Complaint  Patient presents with  . URI    cough, weak, congested, post nasal drip, headache, fever on Wednesday    HPI  PT presents with concern for URI along with Migraine.  She started 3-4 days ago with sore throat, then developed hoarse voice, swollen tonsils, nasal congestion, fevers and chills.  Has tried dayquil, valtrex for fever blisters, and throat lozenges without relief of symptoms.   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  Migraine - Did take fioricet this morning which helped a bit.  Took nortriptyline and phenergan at 10am today - is now experiencing some relief of her headache pain.  She has been able to rest this afternoon before her appointment.   She is current taking Fioricet, Topamax, nortriptyline, phenergan for migraines.  She has needed steroidal treatment in the past to help her break a migraine cycle.  Current headache symptoms include photo/phono sensitivity along with smell sensitivity, nausea.  Also endorses ongoing body aches, sore throat, nasal congestion.  Denies extremity weakness, numbness/tingling, speech changes, confusion, facial droop, chest pain, or shortness of breath.  Patient Active Problem List   Diagnosis Date Noted  . Posterior neck pain 08/30/2015  . IBS (irritable bowel syndrome) 07/12/2015  . Anxiety, generalized 05/17/2015  . Headache disorder 05/17/2015  . Dysmenorrhea 05/17/2015  . Dysmetabolic syndrome 94/76/5465  . Migraine with aura and without status migrainosus 05/17/2015  . Extreme obesity 05/17/2015    Social History   Tobacco Use  . Smoking status: Former Smoker    Years: 8.00    Types: Cigarettes    Start date: 11/17/1993    Last attempt to quit: 11/17/2001    Years since quitting: 16.2  . Smokeless tobacco: Never Used  Substance Use Topics  . Alcohol use:  No    Alcohol/week: 0.0 oz     Current Outpatient Medications:  .  butalbital-acetaminophen-caffeine (FIORICET, ESGIC) 50-325-40 MG tablet, Take 1 tablet by mouth every 4 (four) hours as needed., Disp: 30 tablet, Rfl: 0 .  clonazePAM (KLONOPIN) 0.5 MG tablet, One po daily prn anxiety, Disp: 15 tablet, Rfl: 0 .  escitalopram (LEXAPRO) 10 MG tablet, Take 1 tablet (10 mg total) by mouth daily., Disp: 90 tablet, Rfl: 1 .  hydrOXYzine (ATARAX/VISTARIL) 10 MG tablet, TAKE 1 TABLET(10 MG) BY MOUTH EVERY 8 HOURS AS NEEDED FOR ANXIETY, Disp: 90 tablet, Rfl: 0 .  nortriptyline (PAMELOR) 50 MG capsule, Take 1 capsule by mouth at bedtime., Disp: , Rfl:  .  promethazine (PHENERGAN) 25 MG tablet, Take 1 tablet (25 mg total) by mouth every 8 (eight) hours as needed., Disp: 30 tablet, Rfl: 0 .  topiramate (TOPAMAX) 100 MG tablet, TAKE 1 TABLET(100 MG) BY MOUTH TWICE DAILY AFTER TITRATION COMPLETE, Disp: 180 tablet, Rfl: 1 .  Vitamin D, Ergocalciferol, (DRISDOL) 50000 units CAPS capsule, Take 1 capsule (50,000 Units total) by mouth every 7 (seven) days., Disp: 12 capsule, Rfl: 0 .  almotriptan (AXERT) 12.5 MG tablet, TAKE 1 TABLET BY MOUTH AT ONSET OF MIGRAINE (Patient not taking: Reported on 02/05/2018), Disp: 9 tablet, Rfl: 0 .  magic mouthwash w/lidocaine SOLN, Take 5 mLs by mouth 3 (three) times daily as needed for mouth pain., Disp: 100 mL, Rfl: 0 .  predniSONE (DELTASONE) 20 MG tablet, Take 1  tablet (20 mg total) by mouth 2 (two) times daily with a meal., Disp: 3 tablet, Rfl: 0  Allergies  Allergen Reactions  . Sumatriptan     crawling sensation in her body, flushed    ROS Ten systems reviewed and is negative except as mentioned in HPI  Objective  Vitals:   02/05/18 1507 02/05/18 1530  BP: 124/72   Pulse: (!) 110 100  Resp: 18   Temp: 98.8 F (37.1 C)   TempSrc: Oral   SpO2: 97%   Weight: 239 lb 1.6 oz (108.5 kg)   Height: 5\' 3"  (1.6 m)    Body mass index is 42.35 kg/m.  Elevated  heartrate may be secondary to URI, pain (headache), or use of fioricet earlier in the day.  Auscultated rate is approximately 100bpm.  Nursing Note and Vital Signs reviewed.  Physical Exam  Constitutional: Patient appears well-developed and well-nourished. Obese. No distress.  HEENT: head atraumatic, normocephalic, pupils equal and reactive to light, EOM's intact, TM's without erythema or bulging, no maxillary or frontal sinus tenderness, neck supple without lymphadenopathy, oropharynx mildly erythematous and moist without exudate Cardiovascular: Normal rate, regular rhythm, S1/S2 present.  No murmur or rub heard. No BLE edema. Pulmonary/Chest: Effort normal and breath sounds clear. No respiratory distress or retractions. Psychiatric: Patient has a slightly anxious mood and affect. behavior is normal. Judgment and thought content normal. Neurological: she is alert and oriented to person, place, and time. No cranial nerve deficit. Coordination, balance, strength, speech and gait are normal.  Skin: Skin is warm and dry. No rash noted. No erythema.   No results found for this or any previous visit (from the past 72 hour(s)).  Assessment & Plan  1. Migraine with aura and without status migrainosus, not intractable - predniSONE (DELTASONE) 20 MG tablet; Take 1 tablet (20 mg total) by mouth 2 (two) times daily with a meal.  Dispense: 3 tablet; Refill: 0 - triamcinolone acetonide (KENALOG-40) injection 40 mg - ketorolac (TORADOL) 30 MG/ML injection 30 mg - Advised to hold fioricet and any NSAID today and tomorrow.  2. Upper respiratory tract infection, unspecified type - magic mouthwash w/lidocaine SOLN; Take 5 mLs by mouth 3 (three) times daily as needed for mouth pain.  Dispense: 100 mL; Refill: 0 - Advised likely viral at this time, recommend supportive care per AVS.  -Red flags and when to present for emergency care or RTC including fever >101.61F, chest pain, shortness of breath,  new/worsening/un-resolving symptoms, any neurologic changes reviewed with patient at time of visit. Follow up and care instructions discussed and provided in AVS.

## 2018-02-13 ENCOUNTER — Other Ambulatory Visit: Payer: Self-pay | Admitting: Family Medicine

## 2018-02-13 DIAGNOSIS — F411 Generalized anxiety disorder: Secondary | ICD-10-CM

## 2018-02-23 ENCOUNTER — Ambulatory Visit: Payer: 59 | Admitting: Family Medicine

## 2018-02-23 ENCOUNTER — Encounter: Payer: Self-pay | Admitting: Family Medicine

## 2018-02-23 VITALS — BP 130/90 | HR 89 | Resp 14 | Ht 63.0 in | Wt 241.0 lb

## 2018-02-23 DIAGNOSIS — G43109 Migraine with aura, not intractable, without status migrainosus: Secondary | ICD-10-CM | POA: Diagnosis not present

## 2018-02-23 DIAGNOSIS — F411 Generalized anxiety disorder: Secondary | ICD-10-CM

## 2018-02-23 DIAGNOSIS — F321 Major depressive disorder, single episode, moderate: Secondary | ICD-10-CM | POA: Diagnosis not present

## 2018-02-23 MED ORDER — BUPROPION HCL ER (XL) 150 MG PO TB24: 150.0000 mg | ORAL_TABLET | Freq: Every day | ORAL | 0 refills | Status: DC

## 2018-02-23 MED ORDER — ESCITALOPRAM OXALATE 10 MG PO TABS: 10.0000 mg | ORAL_TABLET | Freq: Every day | ORAL | 1 refills | Status: DC

## 2018-02-23 NOTE — Patient Instructions (Signed)
Suicidal Feelings: How to Help Yourself  Suicide is the taking of one's own life. If you feel as though life is getting too tough to handle and are thinking about suicide, get help right away. To get help:  Call your local emergency services (911 in the [place]  Call a suicide hotline to speak with a trained counselor who understands how you are feeling. The following is a list of suicide hotlines in the [place]. For a list of hotlines in [place], visit www.suicide.org/hotlines/international/[place]-suicide-hotlines.html.  1-800-273-TALK (1-800-273-8255).  1-800-SUICIDE (1-800-784-2433).  1-888-628-9454. This is a hotline for Spanish speakers.  1-800-799-4TTY (1-800-799-4889). This is a hotline for TTY users.  1-866-4-U-TREVOR (1-866-488-7386). This is a hotline for lesbian, gay, bisexual, transgender, or questioning youth.  Contact a crisis center or a local suicide prevention center. To find a crisis center or suicide prevention center:  Call your local hospital, clinic, community service organization, mental health center, social service provider, or health department. Ask for assistance in connecting to a crisis center.  Visit www.suicidepreventionlifeline.org/getinvolved/locator for a list of crisis centers in the [place], or visit www.suicideprevention.ca/thinking-about-suicide/find-a-crisis-centre for a list of centers in [place].  Visit the following websites:  National Suicide Prevention Lifeline: www.suicidepreventionlifeline.org  Hopeline: www.hopeline.com  American Foundation for Suicide Prevention: www.afsp.org  The Trevor Project (for lesbian, gay, bisexual, transgender, or questioning youth): www.thetrevorproject.org    How can I help myself feel better?  Promise yourself that you will not do anything drastic when you have suicidal feelings. Remember, there is hope. Many people have gotten through suicidal thoughts and feelings, and you will, too. You may have gotten through them before, and  this proves that you can get through them again.  Let family, friends, teachers, or counselors know how you are feeling. Try not to isolate yourself from those who care about you. Remember, they will want to help you. Talk with someone every day, even if you do not feel sociable. Face-to-face conversation is best.  Call a mental health professional and see one regularly.  Visit your primary health care provider every year.  Eat a well-balanced diet, and space your meals so you eat regularly.  Get plenty of rest.  Avoid alcohol and drugs, and remove them from your home. They will only make you feel worse.  If you are thinking of taking a lot of medicine, give your medicine to someone who can give it to you one day at a time. If you are on antidepressants and are concerned you will overdose, let your health care provider know so he or she can give you safer medicines. Ask your mental health professional about the possible side effects of any medicines you are taking.  Remove weapons, poisons, knives, and anything else that could harm you from your home.  Try to stick to routines. Follow a schedule every day. Put self-care on your schedule.  Make a list of realistic goals, and cross them off when you achieve them. Accomplishments give a sense of worth.  Wait until you are feeling better before doing the things you find difficult or unpleasant.  Exercise if you are able. You will feel better if you exercise for even a half hour each day.  Go out in the sun or into nature. This will help you recover from depression faster. If you have a favorite place to walk, go there.  Do the things that have always given you pleasure. Play your favorite music, read a good book, paint a picture, play your favorite   instrument, or do anything else that takes your mind off your depression if it is safe to do.  Keep your living space well lit.  When you are feeling well, write yourself a letter about tips and support that you can read when  you are not feeling well.  Remember that life's difficulties can be sorted out with help. Conditions can be treated. You can work on thoughts and strategies that serve you well.  This information is not intended to replace advice given to you by your health care provider. Make sure you discuss any questions you have with your health care provider.  Document Released: 05/10/2003 Document Revised: 07/02/2016 Document Reviewed: 02/28/2014  Elsevier Interactive Patient Education  2018 [employer]

## 2018-02-23 NOTE — Progress Notes (Signed)
Name: Sally Mueller   MRN: [id number]    DOB: April 19, 1976   Date:03/03/2018       Progress Note  Subjective  Chief Complaint  Chief Complaint  Patient presents with  . Migraine  . Depression    HPI  Migraine: he needs to take time off, recently had a severe episode of migraine that was treated in the office, episodes are still frequent, about every other week. However daily headache has improved  Major Depression: severe now, crying, no libido, lack of energy, gets upset with herself, sleeping all day when not at work. States her youngest son Sally Mueller is causing her family to fall apart. Worries about her relationship with her significant other. Denies suicidal thoughts or ideation. No cleaning or cooking lately. Able to shower and go to work.   GAD: still has anxiety and lexapro seems to work for that , but not for the depression   Patient Active Problem List   Diagnosis Date Noted  . Moderate major depression ([place]) 02/23/2018  . Posterior neck pain 08/30/2015  . IBS (irritable bowel syndrome) 07/12/2015  . Anxiety, generalized 05/17/2015  . Headache disorder 05/17/2015  . Dysmenorrhea 05/17/2015  . Dysmetabolic syndrome 81/19/1478  . Migraine with aura and without status migrainosus 05/17/2015  . Morbid obesity ([place]) 05/17/2015    Past Surgical History:  Procedure Laterality Date  . CESAREAN SECTION  2000, 2002  . CHOLECYSTECTOMY  2008?  [name] LIPOMA EXCISION  05/01/2014  . TUBAL LIGATION  2002  . TUMOR REMOVAL  2012?    Family History  Problem Relation Age of Onset  . Cardiomyopathy Mother   . Hypertension Mother   . Drug abuse Mother   . Hyperlipidemia Father   . Alcohol abuse Sister   . Drug abuse Sister   . Bipolar disorder Sister   . Schizophrenia Sister   . Breast cancer Neg Hx     Social History   Socioeconomic History  . Marital status: Soil scientist    Spouse name: Not on file  . Number of children: 3  . Years of education: Not on file  . Highest  education level: Bachelor's degree (e.g., BA, AB, BS)  Occupational History  . Occupation: Pharmacist, hospital  Social Needs  . Financial resource strain: Somewhat hard  . Food insecurity:    Worry: Never true    Inability: Never true  . Transportation needs:    Medical: No    Non-medical: No  Tobacco Use  . Smoking status: Former Smoker    Years: 8.00    Types: Cigarettes    Start date: 11/17/1993    Last attempt to quit: 11/17/2001    Years since quitting: 16.3  . Smokeless tobacco: Never Used  Substance and Sexual Activity  . Alcohol use: No    Alcohol/week: 0.0 oz  . Drug use: No  . Sexual activity: Yes    Partners: Male    Birth control/protection: Other-see comments    Comment: Tubal Ligation  Lifestyle  . Physical activity:    Days per week: 0 days    Minutes per session: 0 min  . Stress: Not on file  Relationships  . Social connections:    Talks on phone: Never    Gets together: Never    Attends religious service: Never    Active member of club or organization: No    Attends meetings of clubs or organizations: Never    Relationship status: Living with partner  . Intimate partner violence:  Fear of current or ex partner: No    Emotionally abused: No    Physically abused: No    Forced sexual activity: No  Other Topics Concern  . Not on file  Social History Narrative   She graduated from school of education     Current Outpatient Medications:  .  almotriptan (AXERT) 12.5 MG tablet, TAKE 1 TABLET BY MOUTH AT ONSET OF MIGRAINE (Patient not taking: Reported on 02/05/2018), Disp: 9 tablet, Rfl: 0 .  buPROPion (WELLBUTRIN XL) 150 MG 24 hr tablet, Take 1 tablet (150 mg total) by mouth daily., Disp: 30 tablet, Rfl: 0 .  butalbital-acetaminophen-caffeine (FIORICET, ESGIC) 50-325-40 MG tablet, Take 1 tablet by mouth every 4 (four) hours as needed., Disp: 30 tablet, Rfl: 0 .  clonazePAM (KLONOPIN) 0.5 MG tablet, One po daily prn anxiety, Disp: 15 tablet, Rfl: 0 .  escitalopram  (LEXAPRO) 10 MG tablet, Take 1 tablet (10 mg total) by mouth daily., Disp: 90 tablet, Rfl: 1 .  hydrOXYzine (ATARAX/VISTARIL) 10 MG tablet, TAKE 1 TABLET(10 MG) BY MOUTH EVERY 8 HOURS AS NEEDED FOR ANXIETY, Disp: 90 tablet, Rfl: 0 .  magic mouthwash w/lidocaine SOLN, Take 5 mLs by mouth 3 (three) times daily as needed for mouth pain., Disp: 100 mL, Rfl: 0 .  nortriptyline (PAMELOR) 50 MG capsule, Take 1 capsule by mouth at bedtime., Disp: , Rfl:  .  predniSONE (DELTASONE) 20 MG tablet, Take 1 tablet (20 mg total) by mouth 2 (two) times daily with a meal., Disp: 3 tablet, Rfl: 0 .  promethazine (PHENERGAN) 25 MG tablet, Take 1 tablet (25 mg total) by mouth every 8 (eight) hours as needed., Disp: 30 tablet, Rfl: 0 .  topiramate (TOPAMAX) 100 MG tablet, TAKE 1 TABLET(100 MG) BY MOUTH TWICE DAILY AFTER TITRATION COMPLETE, Disp: 180 tablet, Rfl: 1 .  Vitamin D, Ergocalciferol, (DRISDOL) 50000 units CAPS capsule, Take 1 capsule (50,000 Units total) by mouth every 7 (seven) days., Disp: 12 capsule, Rfl: 0  Allergies  Allergen Reactions  . Sumatriptan     crawling sensation in her body, flushed     ROS  Constitutional: Negative for fever or weight change.  Respiratory: Negative for cough and shortness of breath.   Cardiovascular: Negative for chest pain or palpitations.  Gastrointestinal: Negative for abdominal pain, no bowel changes.  Musculoskeletal: Negative for gait problem or joint swelling.  Skin: Negative for rash.  Neurological: Negative for dizziness , positive for  headache.  No other specific complaints in a complete review of systems (except as listed in HPI above).  Objective   Vitals:   02/23/18 1629  BP: 130/90  Pulse: 89  Resp: 14  SpO2: 98%  Weight: 241 lb (109.3 kg)  Height: 5\' 3"  (1.6 m)    Body mass index is 42.69 kg/m.  Physical Exam  Constitutional: Patient appears well-developed and well-nourished. Obese No distress.  HEENT: head atraumatic, normocephalic,  pupils equal and reactive to light, neck supple, throat within normal limits Cardiovascular: Normal rate, regular rhythm and normal heart sounds.  No murmur heard. No BLE edema. Pulmonary/Chest: Effort normal and breath sounds normal. No respiratory distress. Abdominal: Soft.  There is no tenderness. Psychiatric: Patient has is depressed, crying today.   PHQ2/9: Depression screen [facility] 2/9 02/23/2018 12/28/2017 09/16/2017 07/29/2017 04/29/2017  Decreased Interest 3 0 1 0 2  Down, Depressed, Hopeless 3 0 1 0 2  PHQ - 2 Score 6 0 2 0 4  Altered sleeping 3 - 3 - 3  Tired, decreased energy 3 - 2 - 3  Change in appetite 1 - 0 - 3  Feeling bad or failure about yourself  2 - 0 - 3  Trouble concentrating 2 - 0 - 1  Moving slowly or fidgety/restless 1 - 0 - 0  Suicidal thoughts 0 - 0 - 0  PHQ-9 Score 18 - 7 - 17  Difficult doing work/chores - - Somewhat difficult - Very difficult     Fall Risk: Fall Risk  12/28/2017 09/16/2017 07/29/2017 04/29/2017 10/27/2016  Falls in the past year? No No No No No      Assessment & Plan  1. Migraine with aura and without status migrainosus, not intractable  FMLA forms filled out   2. Anxiety, generalized  - escitalopram (LEXAPRO) 10 MG tablet; Take 1 tablet (10 mg total) by mouth daily.  Dispense: 90 tablet; Refill: 1  3. Moderate major depression ([facility])  - buPROPion (WELLBUTRIN XL) 150 MG 24 hr tablet; Take 1 tablet (150 mg total) by mouth daily.  Dispense: 30 tablet; Refill: 0 - escitalopram (LEXAPRO) 10 MG tablet; Take 1 tablet (10 mg total) by mouth daily.  Dispense: 90 tablet; Refill: 1

## 2018-03-24 ENCOUNTER — Other Ambulatory Visit: Payer: Self-pay | Admitting: Family Medicine

## 2018-03-24 DIAGNOSIS — F321 Major depressive disorder, single episode, moderate: Secondary | ICD-10-CM

## 2018-03-30 ENCOUNTER — Encounter: Payer: Self-pay | Admitting: Family Medicine

## 2018-03-30 ENCOUNTER — Ambulatory Visit: Payer: 59 | Admitting: Family Medicine

## 2018-03-30 VITALS — BP 130/100 | HR 82 | Resp 16 | Ht 63.0 in | Wt 241.2 lb

## 2018-03-30 DIAGNOSIS — F321 Major depressive disorder, single episode, moderate: Secondary | ICD-10-CM

## 2018-03-30 DIAGNOSIS — G43109 Migraine with aura, not intractable, without status migrainosus: Secondary | ICD-10-CM

## 2018-03-30 DIAGNOSIS — R030 Elevated blood-pressure reading, without diagnosis of hypertension: Secondary | ICD-10-CM | POA: Diagnosis not present

## 2018-03-30 MED ORDER — BUPROPION HCL ER (XL) 150 MG PO TB24: ORAL_TABLET | ORAL | 1 refills | Status: DC

## 2018-03-30 NOTE — Progress Notes (Signed)
Name: Sally Mueller   MRN: [id number]    DOB: Sep 07, 1976   Date:03/30/2018       Progress Note  Subjective  Chief Complaint  Chief Complaint  Patient presents with  . Migraine  . Medication Refill    3 month F/U  . Depression  . Anxiety    HPI  Migraine : today episode is not as severe on left eye, sharp and throbbing, but not very intense, shooting to left temporal area.Mild nausea but no vomiting, able to work. She did not take any medication today   Major Depression: started her on Wellbutrin 02/2018, she states no longer having daily crying, still has lack of libido  lack of energy, gets upset with herself, still sleeping most of the weekends . States her youngest son Rolla Plate is causing her family to fall apart, but she is trying to not engage to not get stressed. . Worries about her relationship with her significant other, but is trying very hard to keep him pleased. Advised her to see psychiatrist or at least a therapist. She is under a lot of stress, husband diagnosed with CHF, youngest 13 having a lot of problems. Phq9 improved  GAD: still has anxiety and lexapro seems to work for that , but not for the depression    Patient Active Problem List   Diagnosis Date Noted  . Moderate major depression ([place]) 02/23/2018  . Posterior neck pain 08/30/2015  . IBS (irritable bowel syndrome) 07/12/2015  . Anxiety, generalized 05/17/2015  . Headache disorder 05/17/2015  . Dysmenorrhea 05/17/2015  . Dysmetabolic syndrome 89/21/1941  . Migraine with aura and without status migrainosus 05/17/2015  . Morbid obesity ([place]) 05/17/2015    Past Surgical History:  Procedure Laterality Date  . CESAREAN SECTION  2000, 2002  . CHOLECYSTECTOMY  2008?  [name] LIPOMA EXCISION  05/01/2014  . TUBAL LIGATION  2002  . TUMOR REMOVAL  2012?    Family History  Problem Relation Age of Onset  . Cardiomyopathy Mother   . Hypertension Mother   . Drug abuse Mother   . Hyperlipidemia Father   . Alcohol  abuse Sister   . Drug abuse Sister   . Bipolar disorder Sister   . Schizophrenia Sister   . Breast cancer Neg Hx     Social History   Socioeconomic History  . Marital status: Soil scientist    Spouse name: Not on file  . Number of children: 3  . Years of education: Not on file  . Highest education level: Bachelor's degree (e.g., BA, AB, BS)  Occupational History  . Occupation: Pharmacist, hospital  Social Needs  . Financial resource strain: Somewhat hard  . Food insecurity:    Worry: Never true    Inability: Never true  . Transportation needs:    Medical: No    Non-medical: No  Tobacco Use  . Smoking status: Former Smoker    Years: 8.00    Types: Cigarettes    Start date: 11/17/1993    Last attempt to quit: 11/17/2001    Years since quitting: 16.3  . Smokeless tobacco: Never Used  Substance and Sexual Activity  . Alcohol use: No    Alcohol/week: 0.0 oz  . Drug use: No  . Sexual activity: Yes    Partners: Male    Birth control/protection: Other-see comments    Comment: Tubal Ligation  Lifestyle  . Physical activity:    Days per week: 0 days    Minutes per session: 0 min  .  Stress: Not on file  Relationships  . Social connections:    Talks on phone: Never    Gets together: Never    Attends religious service: Never    Active member of club or organization: No    Attends meetings of clubs or organizations: Never    Relationship status: Living with partner  . Intimate partner violence:    Fear of current or ex partner: No    Emotionally abused: No    Physically abused: No    Forced sexual activity: No  Other Topics Concern  . Not on file  Social History Narrative   She graduated from school of education     Current Outpatient Medications:  .  almotriptan (AXERT) 12.5 MG tablet, TAKE 1 TABLET BY MOUTH AT ONSET OF MIGRAINE, Disp: 9 tablet, Rfl: 0 .  buPROPion (WELLBUTRIN XL) 150 MG 24 hr tablet, TAKE 1 TABLET(150 MG) BY MOUTH DAILY, Disp: 30 tablet, Rfl: 0 .  clonazePAM  (KLONOPIN) 0.5 MG tablet, One po daily prn anxiety, Disp: 15 tablet, Rfl: 0 .  escitalopram (LEXAPRO) 10 MG tablet, Take 1 tablet (10 mg total) by mouth daily., Disp: 90 tablet, Rfl: 1 .  hydrOXYzine (ATARAX/VISTARIL) 10 MG tablet, TAKE 1 TABLET(10 MG) BY MOUTH EVERY 8 HOURS AS NEEDED FOR ANXIETY, Disp: 90 tablet, Rfl: 0 .  nortriptyline (PAMELOR) 50 MG capsule, Take 1 capsule by mouth at bedtime., Disp: , Rfl:  .  promethazine (PHENERGAN) 25 MG tablet, Take 1 tablet (25 mg total) by mouth every 8 (eight) hours as needed., Disp: 30 tablet, Rfl: 0 .  topiramate (TOPAMAX) 100 MG tablet, TAKE 1 TABLET(100 MG) BY MOUTH TWICE DAILY AFTER TITRATION COMPLETE, Disp: 180 tablet, Rfl: 1 .  Vitamin D, Ergocalciferol, (DRISDOL) 50000 units CAPS capsule, Take 1 capsule (50,000 Units total) by mouth every 7 (seven) days., Disp: 12 capsule, Rfl: 0 .  butalbital-acetaminophen-caffeine (FIORICET, ESGIC) 50-325-40 MG tablet, Take 1 tablet by mouth every 4 (four) hours as needed. (Patient not taking: Reported on 03/30/2018), Disp: 30 tablet, Rfl: 0 .  predniSONE (DELTASONE) 20 MG tablet, Take 1 tablet (20 mg total) by mouth 2 (two) times daily with a meal. (Patient not taking: Reported on 03/30/2018), Disp: 3 tablet, Rfl: 0  Allergies  Allergen Reactions  . Sumatriptan     crawling sensation in her body, flushed     ROS  Constitutional: Negative for fever or weight change.  Respiratory: Negative for cough and shortness of breath.   Cardiovascular: Negative for chest pain or palpitations.  Gastrointestinal: Negative for abdominal pain, no bowel changes.  Musculoskeletal: Negative for gait problem or joint swelling.  Skin: Negative for rash.  Neurological: Negative for dizziness , positive for  headache.  No other specific complaints in a complete review of systems (except as listed in HPI above).  Objective  Vitals:   03/30/18 1546  BP: (!) 130/100  Pulse: 82  Resp: 16  SpO2: 99%  Weight: 241 lb 3.2 oz  (109.4 kg)  Height: 5\' 3"  (1.6 m)    Body mass index is 42.73 kg/m.  Physical Exam  Constitutional: Patient appears well-developed and well-nourished. Obese  No distress.  HEENT: head atraumatic, normocephalic, pupils equal and reactive to light,  neck supple, throat within normal limits Cardiovascular: Normal rate, regular rhythm and normal heart sounds.  No murmur heard. No BLE edema. Pulmonary/Chest: Effort normal and breath sounds normal. No respiratory distress. Abdominal: Soft.  There is no tenderness. Psychiatric: Patient has a normal mood  and affect. behavior is normal. Judgment and thought content normal.  PHQ2/9: Depression screen [facility] 2/9 03/30/2018 02/23/2018 12/28/2017 09/16/2017 07/29/2017  Decreased Interest 2 3 0 1 0  Down, Depressed, Hopeless 1 3 0 1 0  PHQ - 2 Score 3 6 0 2 0  Altered sleeping 2 3 - 3 -  Tired, decreased energy 2 3 - 2 -  Change in appetite 1 1 - 0 -  Feeling bad or failure about yourself  1 2 - 0 -  Trouble concentrating 1 2 - 0 -  Moving slowly or fidgety/restless 0 1 - 0 -  Suicidal thoughts 0 0 - 0 -  PHQ-9 Score 10 18 - 7 -  Difficult doing work/chores Somewhat difficult - - Somewhat difficult -    Fall Risk: Fall Risk  03/30/2018 12/28/2017 09/16/2017 07/29/2017 04/29/2017  Falls in the past year? No No No No No     Assessment & Plan  1. Moderate major depression (HCC)  - buPROPion (WELLBUTRIN XL) 150 MG 24 hr tablet; TAKE 1 TABLET(150 MG) BY MOUTH DAILY  Dispense: 30 tablet; Refill: 1  2. Elevated blood pressure reading without diagnosis of hypertension  Advised to check bp at local pharmacy, we will monitor   3. Migraine  She states that her cycle started today and has a dull headache

## 2018-03-31 ENCOUNTER — Other Ambulatory Visit: Payer: Self-pay | Admitting: Family Medicine

## 2018-03-31 DIAGNOSIS — F411 Generalized anxiety disorder: Secondary | ICD-10-CM

## 2018-03-31 NOTE — Telephone Encounter (Signed)
Refill request for general medication: Clonazepam 0.5 mg  Last office visit: 03/30/2018  Last physical exam: 10/03/2016  Follow-ups on file. 05/31/2018    '

## 2018-05-08 ENCOUNTER — Other Ambulatory Visit: Payer: Self-pay | Admitting: Family Medicine

## 2018-05-08 DIAGNOSIS — G43109 Migraine with aura, not intractable, without status migrainosus: Secondary | ICD-10-CM

## 2018-05-31 ENCOUNTER — Encounter: Payer: Self-pay | Admitting: Family Medicine

## 2018-05-31 ENCOUNTER — Ambulatory Visit: Payer: 59 | Admitting: Family Medicine

## 2018-05-31 VITALS — BP 118/82 | HR 126 | Temp 99.1°F | Resp 18 | Ht 63.0 in | Wt 241.2 lb

## 2018-05-31 DIAGNOSIS — F321 Major depressive disorder, single episode, moderate: Secondary | ICD-10-CM | POA: Diagnosis not present

## 2018-05-31 DIAGNOSIS — F411 Generalized anxiety disorder: Secondary | ICD-10-CM

## 2018-05-31 DIAGNOSIS — D518 Other vitamin B12 deficiency anemias: Secondary | ICD-10-CM

## 2018-05-31 DIAGNOSIS — E8881 Metabolic syndrome: Secondary | ICD-10-CM

## 2018-05-31 DIAGNOSIS — F40248 Other situational type phobia: Secondary | ICD-10-CM | POA: Insufficient documentation

## 2018-05-31 DIAGNOSIS — G43109 Migraine with aura, not intractable, without status migrainosus: Secondary | ICD-10-CM

## 2018-05-31 DIAGNOSIS — E785 Hyperlipidemia, unspecified: Secondary | ICD-10-CM

## 2018-05-31 MED ORDER — TOPIRAMATE 100 MG PO TABS: ORAL_TABLET | ORAL | 1 refills | Status: DC

## 2018-05-31 MED ORDER — HYDROXYZINE HCL 10 MG PO TABS: 10.0000 mg | ORAL_TABLET | Freq: Three times a day (TID) | ORAL | 2 refills | Status: DC | PRN

## 2018-05-31 NOTE — Progress Notes (Signed)
Name: Sally Mueller   MRN: [id number]    DOB: 06/04/1976   Date:05/31/2018       Progress Note  Subjective  Chief Complaint  Chief Complaint  Patient presents with  . Follow-up    2 month f/u  . Migraine  . Depression  . Anxiety  . Medication Refill    HPI   Migraine : she continues to have intermittent headache, taking topamax daily. Migraine episodes twice past few weeks, but it was much less frequent . She always has one prior to her cycles.   Major Depression: started her on Wellbutrin 02/2018, she noticed initially wellbutrin worked but not anymore. She stopped medication on her own. She still has lack of libido  lack of energy, gets upset with herself, still sleeping most of the weekends, does not feel like cleaning her house  Her son Rolla Plate left their house and has drug charges - marijuana possession . Worries about her relationship with her significant other, but is trying very hard to keep him pleased. She is taking lexapro. Trying to get a new job. 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 When she goes she gets upset at herself because she needs to leave. Feels bad about herself.   GAD: she states she is feeling more stressed lately, she states only thing that worked was alprazolam XR. Explained that we would refer her to psychiatrist and therapist, but she cannot afford it.   Metabolic syndrome: she denies polyphagia, polydipsia or polyuria   Patient Active Problem List   Diagnosis Date Noted  . Other situational type phobia 05/31/2018  . Moderate major depression ([place]) 02/23/2018  . Posterior neck pain 08/30/2015  . IBS (irritable bowel syndrome) 07/12/2015  . Anxiety, generalized 05/17/2015  . Headache disorder 05/17/2015  . Dysmenorrhea 05/17/2015  . Dysmetabolic  syndrome 81/82/9937  . Migraine with aura and without status migrainosus 05/17/2015  . Morbid obesity ([place]) 05/17/2015    Past Surgical History:  Procedure Laterality Date  . CESAREAN SECTION  2000, 2002  . CHOLECYSTECTOMY  2008?  [name] LIPOMA EXCISION  05/01/2014  . TUBAL LIGATION  2002  . TUMOR REMOVAL  2012?    Family History  Problem Relation Age of Onset  . Cardiomyopathy Mother   . Hypertension Mother   . Drug abuse Mother   . Hyperlipidemia Father   . Alcohol abuse Sister   . Drug abuse Sister   . Bipolar disorder Sister   . Schizophrenia Sister   . Breast cancer Neg Hx     Social History   Socioeconomic History  . Marital status: Soil scientist    Spouse name: Not on file  . Number of children: 3  . Years of education: Not on file  . Highest education level: Bachelor's degree (e.g., BA, AB, BS)  Occupational History  . Occupation: Pharmacist, hospital  Social Needs  . Financial resource strain: Somewhat hard  . Food insecurity:    Worry: Never true    Inability: Never true  . Transportation needs:    Medical: No    Non-medical: No  Tobacco Use  . Smoking status: Former Smoker    Years: 8.00    Types: Cigarettes    Start date: 11/17/1993    Last attempt to quit:  11/17/2001    Years since quitting: 16.5  . Smokeless tobacco: Never Used  Substance and Sexual Activity  . Alcohol use: No    Alcohol/week: 0.0 oz  . Drug use: No  . Sexual activity: Yes    Partners: Male    Birth control/protection: Other-see comments    Comment: Tubal Ligation  Lifestyle  . Physical activity:    Days per week: 0 days    Minutes per session: 0 min  . Stress: Not on file  Relationships  . Social connections:    Talks on phone: Once a week    Gets together: Once a week    Attends religious service: Never    Active member of club or organization: No    Attends meetings of clubs or organizations: Never    Relationship status: Living with partner  . Intimate partner violence:     Fear of current or ex partner: No    Emotionally abused: No    Physically abused: No    Forced sexual activity: No  Other Topics Concern  . Not on file  Social History Narrative   She graduated from school of education     Current Outpatient Medications:  .  almotriptan (AXERT) 12.5 MG tablet, TAKE 1 TABLET BY MOUTH AT ONSET OF MIGRAINE, Disp: 9 tablet, Rfl: 0 .  buPROPion (WELLBUTRIN XL) 150 MG 24 hr tablet, TAKE 1 TABLET(150 MG) BY MOUTH DAILY, Disp: 30 tablet, Rfl: 1 .  butalbital-acetaminophen-caffeine (FIORICET, ESGIC) 50-325-40 MG tablet, Take 1 tablet by mouth every 4 (four) hours as needed., Disp: 30 tablet, Rfl: 0 .  clonazePAM (KLONOPIN) 0.5 MG tablet, TAKE 1 TABLET BY MOUTH EVERY DAY AS NEEDED FOR ANXIETY, Disp: 15 tablet, Rfl: 0 .  escitalopram (LEXAPRO) 10 MG tablet, Take 1 tablet (10 mg total) by mouth daily., Disp: 90 tablet, Rfl: 1 .  hydrOXYzine (ATARAX/VISTARIL) 10 MG tablet, TAKE 1 TABLET(10 MG) BY MOUTH EVERY 8 HOURS AS NEEDED FOR ANXIETY, Disp: 90 tablet, Rfl: 0 .  nortriptyline (PAMELOR) 50 MG capsule, Take 1 capsule by mouth at bedtime., Disp: , Rfl:  .  promethazine (PHENERGAN) 25 MG tablet, Take 1 tablet (25 mg total) by mouth every 8 (eight) hours as needed., Disp: 30 tablet, Rfl: 0 .  topiramate (TOPAMAX) 100 MG tablet, TAKE 1 TABLET(100 MG) BY MOUTH TWICE DAILY AFTER TITRATION COMPLETE, Disp: 180 tablet, Rfl: 1 .  Vitamin D, Ergocalciferol, (DRISDOL) 50000 units CAPS capsule, Take 1 capsule (50,000 Units total) by mouth every 7 (seven) days., Disp: 12 capsule, Rfl: 0  Allergies  Allergen Reactions  . Sumatriptan     crawling sensation in her body, flushed     ROS  Constitutional: Negative for fever or weight change.  Respiratory: Negative for cough and shortness of breath.   Cardiovascular: Negative for chest pain or palpitations.  Gastrointestinal: Negative for abdominal pain, no bowel changes.  Musculoskeletal: Negative for gait problem or joint  swelling.  Skin: Negative for rash.  Neurological: Negative for dizziness, positive for intermittent  headache.  No other specific complaints in a complete review of systems (except as listed in HPI above).  Objective  Vitals:   05/31/18 1530  BP: 118/82  Pulse: (!) 126  Resp: 18  Temp: 99.1 F (37.3 C)  TempSrc: Oral  SpO2: 98%  Weight: 241 lb 3.2 oz (109.4 kg)  Height: 5\' 3"  (1.6 m)    Body mass index is 42.73 kg/m.  Physical Exam  Constitutional: Patient appears well-developed and  well-nourished. Obese No distress.  HEENT: head atraumatic, normocephalic, pupils equal and reactive to light,  neck supple, throat within normal limits Cardiovascular: Normal rate, regular rhythm and normal heart sounds.  No murmur heard. No BLE edema. Pulmonary/Chest: Effort normal and breath sounds normal. No respiratory distress. Abdominal: Soft.  There is no tenderness. Psychiatric: Patient has a normal mood and affect. behavior is normal. Judgment and thought content normal.   PHQ2/9: Depression screen [facility] 2/9 05/31/2018 05/31/2018 03/30/2018 02/23/2018 12/28/2017  Decreased Interest 2 0 2 3 0  Down, Depressed, Hopeless 1 0 1 3 0  PHQ - 2 Score 3 0 3 6 0  Altered sleeping 0 - 2 3 -  Tired, decreased energy 1 - 2 3 -  Change in appetite 0 - 1 1 -  Feeling bad or failure about yourself  1 - 1 2 -  Trouble concentrating 3 - 1 2 -  Moving slowly or fidgety/restless 1 - 0 1 -  Suicidal thoughts 0 - 0 0 -  PHQ-9 Score 9 - 10 18 -  Difficult doing work/chores - - Somewhat difficult - -     Fall Risk: Fall Risk  05/31/2018 03/30/2018 12/28/2017 09/16/2017 07/29/2017  Falls in the past year? No No No No No     Functional Status Survey: Is the patient deaf or have difficulty hearing?: No Does the patient have difficulty seeing, even when wearing glasses/contacts?: No Does the patient have difficulty concentrating, remembering, or making decisions?: No Does the patient have difficulty walking  or climbing stairs?: No Does the patient have difficulty dressing or bathing?: No Does the patient have difficulty doing errands alone such as visiting a doctor's office or shopping?: No    Assessment & Plan  1. Moderate major depression ([place])  Explained again needs for therapy and psychiatric evaluation but she states she cannot afford it   2. Other situational type phobia  Unable to go groceries shopping   3. Anxiety, generalized  - hydrOXYzine (ATARAX/VISTARIL) 10 MG tablet; Take 1 tablet (10 mg total) by mouth every 8 (eight) hours as needed.  Dispense: 90 tablet; Refill: 2  4. Other vitamin B12 deficiency anemia  Continue B12 supplementation   5. Metabolic syndrome  Last [id number] was normal   6. Dyslipidemia  Low HDL discussed life style modification   7. Migraine with aura and without status migrainosus, not intractable  - topiramate (TOPAMAX) 100 MG tablet; TAKE 1 TABLET(100 MG) BY MOUTH TWICE DAILY AFTER TITRATION COMPLETE  Dispense: 180 tablet; Refill: 1

## 2018-08-04 ENCOUNTER — Other Ambulatory Visit: Payer: Self-pay | Admitting: Family Medicine

## 2018-08-04 ENCOUNTER — Encounter: Payer: Self-pay | Admitting: Family Medicine

## 2018-08-04 DIAGNOSIS — F411 Generalized anxiety disorder: Secondary | ICD-10-CM

## 2018-08-04 MED ORDER — CLONAZEPAM 0.5 MG PO TABS: ORAL_TABLET | ORAL | 0 refills | Status: DC

## 2018-08-15 DIAGNOSIS — R51 Headache: Secondary | ICD-10-CM | POA: Diagnosis not present

## 2018-08-30 ENCOUNTER — Ambulatory Visit: Payer: Self-pay | Admitting: Family Medicine

## 2018-08-31 ENCOUNTER — Ambulatory Visit: Payer: Self-pay | Admitting: Family Medicine

## 2018-09-03 ENCOUNTER — Ambulatory Visit: Payer: Self-pay | Admitting: Family Medicine

## 2018-09-07 ENCOUNTER — Encounter: Payer: Self-pay | Admitting: Family Medicine

## 2018-09-07 ENCOUNTER — Ambulatory Visit: Payer: 59 | Admitting: Family Medicine

## 2018-09-07 VITALS — BP 120/74 | HR 88 | Temp 98.7°F | Ht 63.0 in | Wt 245.1 lb

## 2018-09-07 DIAGNOSIS — R519 Headache, unspecified: Secondary | ICD-10-CM

## 2018-09-07 DIAGNOSIS — N3946 Mixed incontinence: Secondary | ICD-10-CM

## 2018-09-07 DIAGNOSIS — E8881 Metabolic syndrome: Secondary | ICD-10-CM

## 2018-09-07 DIAGNOSIS — G43109 Migraine with aura, not intractable, without status migrainosus: Secondary | ICD-10-CM

## 2018-09-07 DIAGNOSIS — F321 Major depressive disorder, single episode, moderate: Secondary | ICD-10-CM

## 2018-09-07 DIAGNOSIS — R51 Headache: Secondary | ICD-10-CM | POA: Diagnosis not present

## 2018-09-07 DIAGNOSIS — F411 Generalized anxiety disorder: Secondary | ICD-10-CM

## 2018-09-07 MED ORDER — BUSPIRONE HCL 7.5 MG PO TABS: 7.5000 mg | ORAL_TABLET | Freq: Three times a day (TID) | ORAL | 1 refills | Status: DC

## 2018-09-07 MED ORDER — FREMANEZUMAB-VFRM 225 MG/1.5ML ~~LOC~~ SOSY: 225.0000 mg | PREFILLED_SYRINGE | SUBCUTANEOUS | 3 refills | Status: DC

## 2018-09-07 NOTE — Progress Notes (Signed)
Name: Sally Mueller   MRN: [id number]    DOB: 22-Sep-1976   Date:09/07/2018       Progress Note  Subjective  Chief Complaint  Chief Complaint  Patient presents with  . Follow-up  . Medication Refill    HPI   Migraine : she continues to have intermittent headache, taking topamax daily and nortriptyline nightly to help with sleep.  She always has one prior to her cycles.  Two weeks ago she had a migraine and it would not go away - she did on-demand teledoc and they put her on a week long prednisone taper and this helped. She has tried to get aimovig covered but it was not covered by her insurance.  We will order ajovy today; no samples available in the office today.  Major Depression: Stopped Wellbutrin after July visit. She is taking clonazepam every few days; she does take hydroxyzine 2-3 times a day.  She talks at length about the fact that she feels that the clonazepam is the only medication that has worked well for her and she feels like she needs to increase her frequency of clonazepam.  Last clonazepam refill was 08/04/2018 - not due for refill today as 15 tablets is to last 3 months. - She still has lack of libidolack of energy, gets upset with herself, still sleeping most of the weekends, does not feel like cleaning her house. Her son Rolla Plate left their house and has drug charges - marijuana possession - but he is back now.  Trying to get a new job. 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 Phobia: she has panic attacks when she is groceries shopping, fear to go shopping, cannot make decisions, it is not helpful even with a list. When she goes she gets upset at herself because she needs to leave. Feels bad about herself.  GAD: she states she is feeling more stressed lately, she states only thing that worked was. Explained that we would refer her to psychiatrist and therapist, but she cannot afford it.  We will try Buspar to replace hydroxyzine and hopefully decrease  need for clonopin  Metabolic syndrome: she denies polyphagia, polydipsia or polyuria  Stress/Urge incontinence: She describes slowly worsening incontinence over the last 2 months - worse when she laughs/coughs/sneezes/jumps, used to only be when her bladder was full, but now it is even when she does not havesensation to urinate. She also describes sharp pain and heaviness in bladder area.  Denies back or flank pain, frank hematuria. Endorses pain with intercourse.  She has her period today and adamantly declines pelvic examination today - we will schedule a follow up ASAP.  She has been doing Kegel exercises without relief of symptoms.  Patient Active Problem List   Diagnosis Date Noted  . Other situational type phobia 05/31/2018  . Moderate major depression ([place]) 02/23/2018  . Posterior neck pain 08/30/2015  . IBS (irritable bowel syndrome) 07/12/2015  . Anxiety, generalized 05/17/2015  . Headache disorder 05/17/2015  . Dysmenorrhea 05/17/2015  . Dysmetabolic syndrome 32/54/9826  . Migraine with aura and without status migrainosus 05/17/2015  . Morbid obesity ([place]) 05/17/2015    Past Surgical History:  Procedure Laterality Date  . CESAREAN SECTION  2000, 2002  . CHOLECYSTECTOMY  2008?  [name] LIPOMA EXCISION  05/01/2014  . TUBAL LIGATION  2002  . TUMOR REMOVAL  2012?    Family History  Problem Relation Age of Onset  . Cardiomyopathy Mother   . Hypertension  Mother   . Drug abuse Mother   . Hyperlipidemia Father   . Alcohol abuse Sister   . Drug abuse Sister   . Bipolar disorder Sister   . Schizophrenia Sister   . Breast cancer Neg Hx     Social History   Socioeconomic History  . Marital status: Soil scientist    Spouse name: Not on file  . Number of children: 3  . Years of education: Not on file  . Highest education level: Bachelor's degree (e.g., BA, AB, BS)  Occupational History  . Occupation: Pharmacist, hospital  Social Needs  . Financial resource strain: Somewhat hard  .  Food insecurity:    Worry: Never true    Inability: Never true  . Transportation needs:    Medical: No    Non-medical: No  Tobacco Use  . Smoking status: Former Smoker    Years: 8.00    Types: Cigarettes    Start date: 11/17/1993    Last attempt to quit: 11/17/2001    Years since quitting: 16.8  . Smokeless tobacco: Never Used  Substance and Sexual Activity  . Alcohol use: No    Alcohol/week: 0.0 standard drinks  . Drug use: No  . Sexual activity: Yes    Partners: Male    Birth control/protection: Other-see comments    Comment: Tubal Ligation  Lifestyle  . Physical activity:    Days per week: 0 days    Minutes per session: 0 min  . Stress: Not on file  Relationships  . Social connections:    Talks on phone: Once a week    Gets together: Once a week    Attends religious service: Never    Active member of club or organization: No    Attends meetings of clubs or organizations: Never    Relationship status: Living with partner  . Intimate partner violence:    Fear of current or ex partner: No    Emotionally abused: No    Physically abused: No    Forced sexual activity: No  Other Topics Concern  . Not on file  Social History Narrative   She graduated from school of education     Current Outpatient Medications:  .  almotriptan (AXERT) 12.5 MG tablet, TAKE 1 TABLET BY MOUTH AT ONSET OF MIGRAINE, Disp: 9 tablet, Rfl: 0 .  clonazePAM (KLONOPIN) 0.5 MG tablet, TAKE 1 TABLET BY MOUTH EVERY DAY AS NEEDED FOR ANXIETY, Disp: 15 tablet, Rfl: 0 .  hydrOXYzine (ATARAX/VISTARIL) 10 MG tablet, Take 1 tablet (10 mg total) by mouth every 8 (eight) hours as needed., Disp: 90 tablet, Rfl: 2 .  nortriptyline (PAMELOR) 50 MG capsule, Take 1 capsule by mouth at bedtime., Disp: , Rfl:  .  promethazine (PHENERGAN) 25 MG tablet, Take 1 tablet (25 mg total) by mouth every 8 (eight) hours as needed., Disp: 30 tablet, Rfl: 0 .  topiramate (TOPAMAX) 100 MG tablet, TAKE 1 TABLET(100 MG) BY MOUTH TWICE  DAILY AFTER TITRATION COMPLETE, Disp: 180 tablet, Rfl: 1 .  Vitamin D, Ergocalciferol, (DRISDOL) 50000 units CAPS capsule, Take 1 capsule (50,000 Units total) by mouth every 7 (seven) days., Disp: 12 capsule, Rfl: 0 .  butalbital-acetaminophen-caffeine (FIORICET, ESGIC) 50-325-40 MG tablet, Take 1 tablet by mouth every 4 (four) hours as needed. (Patient not taking: Reported on 09/07/2018), Disp: 30 tablet, Rfl: 0 .  escitalopram (LEXAPRO) 10 MG tablet, Take 1 tablet (10 mg total) by mouth daily. (Patient not taking: Reported on 09/07/2018), Disp: 90 tablet, Rfl:  1  Allergies  Allergen Reactions  . Sumatriptan     crawling sensation in her body, flushed    I personally reviewed active problem list, medication list, allergies, family history, social history, health maintenance, lab results with the patient/caregiver today.   ROS Ten systems reviewed and is negative except as mentioned in HPI  Objective  Vitals:   09/07/18 1500  BP: 120/74  Pulse: 88  Temp: 98.7 F (37.1 C)  SpO2: 99%  Weight: 245 lb 1.6 oz (111.2 kg)  Height: 5\' 3"  (1.6 m)    Body mass index is 43.42 kg/m.  Physical Exam Constitutional: Patient appears well-developed and well-nourished. No distress.  HENT: Head: Normocephalic and atraumatic. Eyes: Conjunctivae and EOM are normal. No scleral icterus.  Pupils are equal, round, and reactive to light.  Neck: Normal range of motion. Neck supple. No JVD present. No thyromegaly present.  Cardiovascular: Normal rate, regular rhythm and normal heart sounds.  No murmur heard. No BLE edema. Pulmonary/Chest: Effort normal and breath sounds normal. No respiratory distress. Musculoskeletal: Normal range of motion, no joint effusions. No gross deformities Neurological: Pt is alert and oriented to person, place, and time. No cranial nerve deficit. Coordination, balance, strength, speech and gait are normal.  Skin: Skin is warm and dry. No rash noted. No erythema.    Psychiatric: Patient has a normal mood and affect. behavior is normal. Judgment and thought content normal.  No results found for this or any previous visit (from the past 72 hour(s)).  PHQ2/9: Depression screen [facility] 2/9 09/07/2018 05/31/2018 05/31/2018 03/30/2018 02/23/2018  Decreased Interest 3 2 0 2 3  Down, Depressed, Hopeless 3 1 0 1 3  PHQ - 2 Score 6 3 0 3 6  Altered sleeping 3 0 - 2 3  Tired, decreased energy 3 1 - 2 3  Change in appetite 0 0 - 1 1  Feeling bad or failure about yourself  0 1 - 1 2  Trouble concentrating 1 3 - 1 2  Moving slowly or fidgety/restless 0 1 - 0 1  Suicidal thoughts 0 0 - 0 0  PHQ-9 Score 13 9 - 10 18  Difficult doing work/chores Somewhat difficult - - Somewhat difficult -   Fall Risk: Fall Risk  09/07/2018 05/31/2018 03/30/2018 12/28/2017 09/16/2017  Falls in the past year? No No No No No    Functional Status Survey: Is the patient deaf or have difficulty hearing?: No Does the patient have difficulty seeing, even when wearing glasses/contacts?: No Does the patient have difficulty concentrating, remembering, or making decisions?: No Does the patient have difficulty walking or climbing stairs?: No Does the patient have difficulty dressing or bathing?: No Does the patient have difficulty doing errands alone such as visiting a doctor's office or shopping?: No  Assessment & Plan  1. Migraine with aura and without status migrainosus, not intractable - Continue medications as prescribed, add Ajovy pending insurance approval. - Fremanezumab-vfrm (AJOVY) 225 MG/1.5ML SOSY; Inject 225 mg into the skin every 30 (thirty) days.  Dispense: 1 Syringe; Refill: 3  2. Headache disorder - Fremanezumab-vfrm (AJOVY) 225 MG/1.5ML SOSY; Inject 225 mg into the skin every 30 (thirty) days.  Dispense: 1 Syringe; Refill: 3  3. Anxiety, generalized - Wellbutrin and Lexapro were ineffective; hyroxyzine only slightly effective.  We will add buspar - safety of medication and  possible CNS depression is discussed in detail. - busPIRone (BUSPAR) 7.5 MG tablet; Take 1 tablet (7.5 mg total) by mouth 3 (three) times daily.  Take 1 tablet at night once daily x7 days, then increase to 1 tablet twice daily, then may increase to three times daily as needed  Dispense: 60 tablet; Refill: 1  4. Moderate major depression (HCC) - Wellbutrin and Lexapro were ineffective  5. Mixed incontinence urge and stress - I am suspicious for prolapse based on symptom description, however she strongly declines pelvic examination today because she is on her cycle.    6. Dysmetabolic syndrome - Labs at CPE - will return ASAP for CPE once her menses is finished.

## 2018-09-15 ENCOUNTER — Ambulatory Visit (INDEPENDENT_AMBULATORY_CARE_PROVIDER_SITE_OTHER): Payer: 59 | Admitting: Family Medicine

## 2018-09-15 ENCOUNTER — Encounter: Payer: Self-pay | Admitting: Family Medicine

## 2018-09-15 ENCOUNTER — Other Ambulatory Visit (HOSPITAL_COMMUNITY)
Admission: RE | Admit: 2018-09-15 | Discharge: 2018-09-15 | Disposition: A | Discharge status: Home / Self Care | Payer: 59 | Source: Ambulatory Visit | Attending: Family Medicine | Admitting: Family Medicine

## 2018-09-15 VITALS — BP 124/82 | HR 92 | Temp 98.3°F | Resp 16 | Ht 63.0 in | Wt 238.9 lb

## 2018-09-15 DIAGNOSIS — Z124 Encounter for screening for malignant neoplasm of cervix: Secondary | ICD-10-CM | POA: Diagnosis not present

## 2018-09-15 DIAGNOSIS — Z01419 Encounter for gynecological examination (general) (routine) without abnormal findings: Secondary | ICD-10-CM | POA: Insufficient documentation

## 2018-09-15 DIAGNOSIS — Z1231 Encounter for screening mammogram for malignant neoplasm of breast: Secondary | ICD-10-CM

## 2018-09-15 DIAGNOSIS — E559 Vitamin D deficiency, unspecified: Secondary | ICD-10-CM

## 2018-09-15 DIAGNOSIS — R5383 Other fatigue: Secondary | ICD-10-CM | POA: Diagnosis not present

## 2018-09-15 DIAGNOSIS — E8881 Metabolic syndrome: Secondary | ICD-10-CM

## 2018-09-15 DIAGNOSIS — D518 Other vitamin B12 deficiency anemias: Secondary | ICD-10-CM

## 2018-09-15 DIAGNOSIS — N3946 Mixed incontinence: Secondary | ICD-10-CM

## 2018-09-15 DIAGNOSIS — Z1159 Encounter for screening for other viral diseases: Secondary | ICD-10-CM | POA: Diagnosis not present

## 2018-09-15 NOTE — Progress Notes (Signed)
Name: Sally Mueller   MRN: [id number]    DOB: 1975/12/05   Date:09/15/2018       Progress Note  Subjective  Chief Complaint  Chief Complaint  Patient presents with  . Annual Exam    HPI  Patient presents for annual CPE.  Diet: Says "I have a bad diet not because I eat a lot, but because I skip a lot of meals".  Breakfast - coffee alone or with oatmeal/biscuit, she skips lunch, then is really hungry when she gets home and eats a sandwich or a snack; dinner she will have at home.  Her depression causes her to be too tired to eat dinner many nights, then wake up in the middle of the night and eat then. 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  USPSTF grade A and B recommendations   Office Visit from 09/15/2018 in [facility]  AUDIT-C Score  0     Depression: Discussed last week, cannot afford psychiatry or psychology referrals. Depression screen [facility] 2/9 09/15/2018 09/07/2018 05/31/2018 05/31/2018 03/30/2018  Decreased Interest '1 3 2 '$ 0 2  Down, Depressed, Hopeless '1 3 1 '$ 0 1  PHQ - 2 Score '2 6 3 '$ 0 3  Altered sleeping 3 3 0 - 2  Tired, decreased energy '3 3 1 '$ - 2  Change in appetite 0 0 0 - 1  Feeling bad or failure about yourself  1 0 1 - 1  Trouble concentrating '1 1 3 '$ - 1  Moving slowly or fidgety/restless 0 0 1 - 0  Suicidal thoughts 0 0 0 - 0  PHQ-9 Score '10 13 9 '$ - 10  Difficult doing work/chores Somewhat difficult Somewhat difficult - - Somewhat difficult  Some recent data might be hidden   Hypertension: BP Readings from Last 3 Encounters:  09/15/18 124/82  09/07/18 120/74  05/31/18 118/82   Obesity: Wt Readings from Last 3 Encounters:  09/15/18 238 lb 14.4 oz (108.4 kg)  09/07/18 245 lb 1.6 oz (111.2 kg)  05/31/18 241 lb 3.2 oz (109.4 kg)   BMI Readings from Last 3 Encounters:  09/15/18 42.32 kg/m  09/07/18 43.42 kg/m  05/31/18 42.73 kg/m    Hep C Screening: We will check today STD testing and  prevention (HIV/chl/gon/syphilis): Declines Intimate partner violence: No concerns Sexual History/Pain during Intercourse: There is discomfort present during intercoruse, but it is not excrutiating Menstrual History/LMP/Abnormal Bleeding: LMP 09/06/2018 From visit 09/07/18 - Stress/Urge incontinence: She describes slowly worsening incontinence over the last 2 months - worse when she laughs/coughs/sneezes/jumps, used to only be when her bladder was full, but now it is even when she does not have sensation to urinate. She also describes sharp pain and heaviness in bladder area.  Denies back or flank pain, frank hematuria. Endorses pain with intercourse as above. She has been doing Kegel exercises without relief of symptoms.  Advanced Care Planning: A voluntary discussion about advance care planning including the explanation and discussion of advance directives.  Discussed health care proxy and Living will, and the patient was able to identify a health care proxy as Margret Chance (Boyfriend).  Patient does not have a living will at present time. If patient does have living will, I have requested they bring this to the clinic to be scanned in to their chart.  Breast cancer: No family or personal history; we will order today No results found for: [facility]  BRCA gene screening: Not indicated Cervical cancer screening:  Normal in 2017; not due today.  Osteoporosis Screening: No family history. No results found for: HMDEXASCAN  Lipids: We will check today Lab Results  Component Value Date   CHOL 166 09/16/2017   CHOL 174 05/09/2016   Lab Results  Component Value Date   HDL 39 (L) 09/16/2017   HDL 40 (L) 05/09/2016   Lab Results  Component Value Date   LDLCALC 107 (H) 09/16/2017   LDLCALC 109 05/09/2016   Lab Results  Component Value Date   TRIG 106 09/16/2017   TRIG 124 05/09/2016   Lab Results  Component Value Date   CHOLHDL 4.3 09/16/2017   CHOLHDL 4.4 05/09/2016   No results found  for: LDLDIRECT  Glucose:  Glucose, Bld  Date Value Ref Range Status  09/16/2017 98 65 - 99 mg/dL Final    Comment:    .            Fasting reference interval .   05/09/2016 100 (H) 65 - 99 mg/dL Final    Skin cancer: No concerning moles or lesions  Colorectal cancer: No family or personal history; no changes in BM's - no blood in stool/dark and tarry stools, diarrhea or constipation Lung cancer:  Low Dose CT Chest recommended if Age 22-80 years, 30 pack-year currently smoking OR have quit w/in 15years. Patient does not qualify.   ECG: None on file; no chest pain or shortness of breath. Endorses palpitatoins occasionally - she declines ECG today.  Patient Active Problem List   Diagnosis Date Noted  . Mixed incontinence urge and stress 09/07/2018  . Other situational type phobia 05/31/2018  . Moderate major depression ([place]) 02/23/2018  . Posterior neck pain 08/30/2015  . IBS (irritable bowel syndrome) 07/12/2015  . Anxiety, generalized 05/17/2015  . Headache disorder 05/17/2015  . Dysmenorrhea 05/17/2015  . Dysmetabolic syndrome 53/66/4403  . Migraine with aura and without status migrainosus 05/17/2015  . Morbid obesity ([place]) 05/17/2015    Past Surgical History:  Procedure Laterality Date  . CESAREAN SECTION  2000, 2002  . CHOLECYSTECTOMY  2008?  [name] LIPOMA EXCISION  05/01/2014  . TUBAL LIGATION  2002  . TUMOR REMOVAL  2012?    Family History  Problem Relation Age of Onset  . Cardiomyopathy Mother   . Hypertension Mother   . Drug abuse Mother   . Hyperlipidemia Father   . Alcohol abuse Sister   . Drug abuse Sister   . Bipolar disorder Sister   . Schizophrenia Sister   . Breast cancer Neg Hx     Social History   Socioeconomic History  . Marital status: Soil scientist    Spouse name: Not on file  . Number of children: 3  . Years of education: Not on file  . Highest education level: Bachelor's degree (e.g., BA, AB, BS)  Occupational History  . Occupation:  Pharmacist, hospital  Social Needs  . Financial resource strain: Somewhat hard  . Food insecurity:    Worry: Never true    Inability: Never true  . Transportation needs:    Medical: No    Non-medical: No  Tobacco Use  . Smoking status: Former Smoker    Years: 8.00    Types: Cigarettes    Start date: 11/17/1993    Last attempt to quit: 11/17/2001    Years since quitting: 16.8  . Smokeless tobacco: Never Used  Substance and Sexual Activity  . Alcohol use: No    Alcohol/week: 0.0 standard drinks  . Drug use: No  .  Sexual activity: Yes    Partners: Male    Birth control/protection: Other-see comments    Comment: Tubal Ligation  Lifestyle  . Physical activity:    Days per week: 0 days    Minutes per session: 0 min  . Stress: Not on file  Relationships  . Social connections:    Talks on phone: Once a week    Gets together: Once a week    Attends religious service: Never    Active member of club or organization: No    Attends meetings of clubs or organizations: Never    Relationship status: Living with partner  . Intimate partner violence:    Fear of current or ex partner: No    Emotionally abused: No    Physically abused: No    Forced sexual activity: No  Other Topics Concern  . Not on file  Social History Narrative   She graduated from school of education     Current Outpatient Medications:  .  almotriptan (AXERT) 12.5 MG tablet, TAKE 1 TABLET BY MOUTH AT ONSET OF MIGRAINE, Disp: 9 tablet, Rfl: 0 .  busPIRone (BUSPAR) 7.5 MG tablet, Take 1 tablet (7.5 mg total) by mouth 3 (three) times daily. Take 1 tablet at night once daily x7 days, then increase to 1 tablet twice daily, then may increase to three times daily as needed, Disp: 60 tablet, Rfl: 1 .  clonazePAM (KLONOPIN) 0.5 MG tablet, TAKE 1 TABLET BY MOUTH EVERY DAY AS NEEDED FOR ANXIETY, Disp: 15 tablet, Rfl: 0 .  hydrOXYzine (ATARAX/VISTARIL) 10 MG tablet, Take 1 tablet (10 mg total) by mouth every 8 (eight) hours as needed., Disp:  90 tablet, Rfl: 2 .  nortriptyline (PAMELOR) 50 MG capsule, Take 1 capsule by mouth at bedtime., Disp: , Rfl:  .  promethazine (PHENERGAN) 25 MG tablet, Take 1 tablet (25 mg total) by mouth every 8 (eight) hours as needed., Disp: 30 tablet, Rfl: 0 .  topiramate (TOPAMAX) 100 MG tablet, TAKE 1 TABLET(100 MG) BY MOUTH TWICE DAILY AFTER TITRATION COMPLETE, Disp: 180 tablet, Rfl: 1 .  Vitamin D, Ergocalciferol, (DRISDOL) 50000 units CAPS capsule, Take 1 capsule (50,000 Units total) by mouth every 7 (seven) days., Disp: 12 capsule, Rfl: 0 .  Fremanezumab-vfrm (AJOVY) 225 MG/1.5ML SOSY, Inject 225 mg into the skin every 30 (thirty) days. (Patient not taking: Reported on 09/15/2018), Disp: 1 Syringe, Rfl: 3  Allergies  Allergen Reactions  . Sumatriptan     crawling sensation in her body, flushed     ROS  Constitutional: Negative for fever or weight change.  Respiratory: Negative for cough and shortness of breath.   Cardiovascular: Negative for chest pain; positive palpitations.  Gastrointestinal: Negative for abdominal pain, no bowel changes.  GU: See HPI Musculoskeletal: Negative for gait problem or joint swelling.  Skin: Negative for rash.  Neurological: Negative for dizziness or headache.  No other specific complaints in a complete review of systems (except as listed in HPI above).  Objective  Vitals:   09/15/18 0723  BP: 124/82  Pulse: 92  Resp: 16  Temp: 98.3 F (36.8 C)  TempSrc: Oral  SpO2: 99%  Weight: 238 lb 14.4 oz (108.4 kg)  Height: '5\' 3"'$  (1.6 m)    Body mass index is 42.32 kg/m.  Physical Exam Constitutional: Patient appears well-developed and well-nourished. No distress.  HENT: Head: Normocephalic and atraumatic. Ears: B TMs ok, no erythema or effusion; Nose: Nose normal. Mouth/Throat: Oropharynx is clear and moist. No oropharyngeal exudate.  Eyes: Conjunctivae and EOM are normal. Pupils are equal, round, and reactive to light. No scleral icterus.  Neck: Normal  range of motion. Neck supple. No JVD present. No thyromegaly present.  Cardiovascular: Normal rate, regular rhythm and normal heart sounds.  No murmur heard. No BLE edema. Pulmonary/Chest: Effort normal and breath sounds normal. No respiratory distress. Abdominal: Soft. Bowel sounds are normal, no distension. There is mild suprapubic tenderness on palpation. no masses Breast: no lumps or masses, no nipple discharge or rashes FEMALE GENITALIA:  External genitalia normal External urethra normal Vaginal vault normal without discharge or lesions Cervix normal with thin white discharge; no lesions Bimanual exam normal without masses Musculoskeletal: Normal range of motion, no joint effusions. No gross deformities Neurological: he is alert and oriented to person, place, and time. No cranial nerve deficit. Coordination, balance, strength, speech and gait are normal.  Skin: Skin is warm and dry. No rash noted. No erythema.  Psychiatric: Patient has a normal mood and affect. behavior is normal. Judgment and thought content normal.  No results found for this or any previous visit (from the past 2160 hour(s)).  PHQ2/9: Depression screen [facility] 2/9 09/15/2018 09/07/2018 05/31/2018 05/31/2018 03/30/2018  Decreased Interest '1 3 2 '$ 0 2  Down, Depressed, Hopeless '1 3 1 '$ 0 1  PHQ - 2 Score '2 6 3 '$ 0 3  Altered sleeping 3 3 0 - 2  Tired, decreased energy '3 3 1 '$ - 2  Change in appetite 0 0 0 - 1  Feeling bad or failure about yourself  1 0 1 - 1  Trouble concentrating '1 1 3 '$ - 1  Moving slowly or fidgety/restless 0 0 1 - 0  Suicidal thoughts 0 0 0 - 0  PHQ-9 Score '10 13 9 '$ - 10  Difficult doing work/chores Somewhat difficult Somewhat difficult - - Somewhat difficult  Some recent data might be hidden   Fall Risk: Fall Risk  09/15/2018 09/07/2018 05/31/2018 03/30/2018 12/28/2017  Falls in the past year? No No No No No   Assessment & Plan  1. Well woman exam -USPSTF grade A and B recommendations reviewed with  patient; age-appropriate recommendations, preventive care, screening tests, etc discussed and encouraged; healthy living encouraged; see AVS for patient education given to patient -Discussed importance of 150 minutes of physical activity weekly, eat two servings of fish weekly, eat one serving of tree nuts ( cashews, pistachios, pecans, almonds.[name]) every other day, eat 6 servings of fruit/vegetables daily and drink plenty of water and avoid sweet beverages.  - Hepatitis C antibody - COMPLETE METABOLIC PANEL WITH GFR - TSH - Lipid panel - Vitamin D (25 hydroxy) - Hemoglobin A1c - CBC w/Diff/Platelet - B12 and Folate Panel - MM 3D SCREEN BREAST BILATERAL; Future - Cytology - PAP  2. Dysmetabolic syndrome - Hemoglobin A1c  3. Fatigue, unspecified type - COMPLETE METABOLIC PANEL WITH GFR - TSH - Lipid panel - Vitamin D (25 hydroxy) - Hemoglobin A1c - CBC w/Diff/Platelet - B12 and Folate Panel  4. Need for hepatitis C screening test - Hepatitis C antibody  5. Other vitamin B12 deficiency anemia - CBC w/Diff/Platelet - B12 and Folate Panel  6. Vitamin D deficiency - Vitamin D (25 hydroxy)  7. Mixed incontinence urge and stress - We will obtain pap and urine testing, depending on results may refer to GYN vs Urology - COMPLETE METABOLIC PANEL WITH GFR - Urinalysis, microscopic only - Urine Culture  8. Breast cancer screening by mammogram - MM 3D SCREEN BREAST BILATERAL;  Future - MM 3D SCREEN BREAST BILATERAL  9. Cervical cancer screening - Cytology - PAP

## 2018-09-15 NOTE — Patient Instructions (Signed)
Preventive Care 40-64 Years, Female Preventive care refers to lifestyle choices and visits with your health care provider that can promote health and wellness. What does preventive care include?  A yearly physical exam. This is also called an annual well check.  Dental exams once or twice a year.  Routine eye exams. Ask your health care provider how often you should have your eyes checked.  Personal lifestyle choices, including: ? Daily care of your teeth and gums. ? Regular physical activity. ? Eating a healthy diet. ? Avoiding tobacco and drug use. ? Limiting alcohol use. ? Practicing safe sex. ? Taking low-dose aspirin daily starting at age 58. ? Taking vitamin and mineral supplements as recommended by your health care provider. What happens during an annual well check? The services and screenings done by your health care provider during your annual well check will depend on your age, overall health, lifestyle risk factors, and family history of disease. Counseling Your health care provider may ask you questions about your:  Alcohol use.  Tobacco use.  Drug use.  Emotional well-being.  Home and relationship well-being.  Sexual activity.  Eating habits.  Work and work Statistician.  Method of birth control.  Menstrual cycle.  Pregnancy history.  Screening You may have the following tests or measurements:  Height, weight, and BMI.  Blood pressure.  Lipid and cholesterol levels. These may be checked every 5 years, or more frequently if you are over 81 years old.  Skin check.  Lung cancer screening. You may have this screening every year starting at age 78 if you have a 30-pack-year history of smoking and currently smoke or have quit within the past 15 years.  Fecal occult blood test (FOBT) of the stool. You may have this test every year starting at age 65.  Flexible sigmoidoscopy or colonoscopy. You may have a sigmoidoscopy every 5 years or a colonoscopy  every 10 years starting at age 30.  Hepatitis C blood test.  Hepatitis B blood test.  Sexually transmitted disease (STD) testing.  Diabetes screening. This is done by checking your blood sugar (glucose) after you have not eaten for a while (fasting). You may have this done every 1-3 years.  Mammogram. This may be done every 1-2 years. Talk to your health care provider about when you should start having regular mammograms. This may depend on whether you have a family history of breast cancer.  BRCA-related cancer screening. This may be done if you have a family history of breast, ovarian, tubal, or peritoneal cancers.  Pelvic exam and Pap test. This may be done every 3 years starting at age 80. Starting at age 36, this may be done every 5 years if you have a Pap test in combination with an HPV test.  Bone density scan. This is done to screen for osteoporosis. You may have this scan if you are at high risk for osteoporosis.  Discuss your test results, treatment options, and if necessary, the need for more tests with your health care provider. Vaccines Your health care provider may recommend certain vaccines, such as:  Influenza vaccine. This is recommended every year.  Tetanus, diphtheria, and acellular pertussis (Tdap, Td) vaccine. You may need a Td booster every 10 years.  Varicella vaccine. You may need this if you have not been vaccinated.  Zoster vaccine. You may need this after age 5.  Measles, mumps, and rubella (MMR) vaccine. You may need at least one dose of MMR if you were born in  1957 or later. You may also need a second dose.  Pneumococcal 13-valent conjugate (PCV13) vaccine. You may need this if you have certain conditions and were not previously vaccinated.  Pneumococcal polysaccharide (PPSV23) vaccine. You may need one or two doses if you smoke cigarettes or if you have certain conditions.  Meningococcal vaccine. You may need this if you have certain  conditions.  Hepatitis A vaccine. You may need this if you have certain conditions or if you travel or work in places where you may be exposed to hepatitis A.  Hepatitis B vaccine. You may need this if you have certain conditions or if you travel or work in places where you may be exposed to hepatitis B.  Haemophilus influenzae type b (Hib) vaccine. You may need this if you have certain conditions.  Talk to your health care provider about which screenings and vaccines you need and how often you need them. This information is not intended to replace advice given to you by your health care provider. Make sure you discuss any questions you have with your health care provider. Document Released: 11/30/2015 Document Revised: 07/23/2016 Document Reviewed: 09/04/2015 Elsevier Interactive Patient Education  2018 Elsevier Inc.  

## 2018-09-16 ENCOUNTER — Other Ambulatory Visit: Payer: Self-pay | Admitting: Family Medicine

## 2018-09-16 DIAGNOSIS — N3946 Mixed incontinence: Secondary | ICD-10-CM

## 2018-09-16 DIAGNOSIS — R1031 Right lower quadrant pain: Secondary | ICD-10-CM

## 2018-09-16 DIAGNOSIS — R1032 Left lower quadrant pain: Secondary | ICD-10-CM

## 2018-09-16 DIAGNOSIS — N941 Unspecified dyspareunia: Secondary | ICD-10-CM

## 2018-09-16 LAB — CBC WITH DIFFERENTIAL/PLATELET
BASOS PCT: 1 %
Basophils Absolute: 49 cells/uL (ref 0–200)
EOS PCT: 1.8 %
Eosinophils Absolute: 88 cells/uL (ref 15–500)
HCT: 37.8 % (ref 35.0–45.0)
HEMOGLOBIN: 12.5 g/dL (ref 11.7–15.5)
LYMPHS ABS: 1152 {cells}/uL (ref 850–3900)
MCH: 27.2 pg (ref 27.0–33.0)
MCHC: 33.1 g/dL (ref 32.0–36.0)
MCV: 82.2 fL (ref 80.0–100.0)
MPV: 11.9 fL (ref 7.5–12.5)
Monocytes Relative: 11.9 %
NEUTROS ABS: 3028 {cells}/uL (ref 1500–7800)
NEUTROS PCT: 61.8 %
Platelets: 263 10*3/uL (ref 140–400)
RBC: 4.6 10*6/uL (ref 3.80–5.10)
RDW: 13.3 % (ref 11.0–15.0)
Total Lymphocyte: 23.5 %
WBC: 4.9 10*3/uL (ref 3.8–10.8)
WBCMIX: 583 {cells}/uL (ref 200–950)

## 2018-09-16 LAB — TSH: TSH: 3.02 m[IU]/L

## 2018-09-16 LAB — URINE CULTURE
MICRO NUMBER:: 91306074
Result:: NO GROWTH
SPECIMEN QUALITY: ADEQUATE

## 2018-09-16 LAB — HEMOGLOBIN A1C
HEMOGLOBIN A1C: 5.3 %{Hb} (ref <5.7)
Mean Plasma Glucose: 105 (calc)
eAG (mmol/L): 5.8 (calc)

## 2018-09-16 LAB — URINALYSIS, MICROSCOPIC ONLY
BACTERIA UA: NONE SEEN /HPF
Hyaline Cast: NONE SEEN /LPF
RBC / HPF: NONE SEEN /HPF (ref 0–2)

## 2018-09-16 LAB — LIPID PANEL
Cholesterol: 168 mg/dL (ref <200)
HDL: 36 mg/dL — ABNORMAL LOW (ref >50)
LDL Cholesterol (Calc): 106 mg/dL (calc) — ABNORMAL HIGH
NON-HDL CHOLESTEROL (CALC): 132 mg/dL — AB (ref <130)
TRIGLYCERIDES: 149 mg/dL (ref <150)
Total CHOL/HDL Ratio: 4.7 (calc) (ref <5.0)

## 2018-09-16 LAB — COMPLETE METABOLIC PANEL WITH GFR
AG Ratio: 1.7 (calc) (ref 1.0–2.5)
ALBUMIN MSPROF: 4 g/dL (ref 3.6–5.1)
ALT: 17 U/L (ref 6–29)
AST: 19 U/L (ref 10–30)
Alkaline phosphatase (APISO): 66 U/L (ref 33–115)
BUN: 10 mg/dL (ref 7–25)
CHLORIDE: 108 mmol/L (ref 98–110)
CO2: 25 mmol/L (ref 20–32)
Calcium: 9.1 mg/dL (ref 8.6–10.2)
Creat: 0.86 mg/dL (ref 0.50–1.10)
GFR, Est African American: 97 mL/min/{1.73_m2} (ref >=60)
GFR, Est Non African American: 83 mL/min/{1.73_m2} (ref >=60)
GLUCOSE: 92 mg/dL (ref 65–99)
Globulin: 2.3 g/dL (calc) (ref 1.9–3.7)
POTASSIUM: 3.8 mmol/L (ref 3.5–5.3)
Sodium: 139 mmol/L (ref 135–146)
TOTAL PROTEIN: 6.3 g/dL (ref 6.1–8.1)
Total Bilirubin: 0.4 mg/dL (ref 0.2–1.2)

## 2018-09-16 LAB — VITAMIN D 25 HYDROXY (VIT D DEFICIENCY, FRACTURES): VIT D 25 HYDROXY: 22 ng/mL — AB (ref 30–100)

## 2018-09-16 LAB — HEPATITIS C ANTIBODY
HEP C AB: NONREACTIVE
SIGNAL TO CUT-OFF: 0.01 (ref <1.00)

## 2018-09-16 LAB — B12 AND FOLATE PANEL
FOLATE: 8.4 ng/mL
Vitamin B-12: 292 pg/mL (ref 200–1100)

## 2018-09-17 ENCOUNTER — Encounter: Payer: Self-pay | Admitting: Family Medicine

## 2018-09-17 LAB — CYTOLOGY - PAP
Diagnosis: NEGATIVE
HPV: NOT DETECTED

## 2018-09-20 ENCOUNTER — Telehealth: Payer: Self-pay | Admitting: Emergency Medicine

## 2018-09-20 DIAGNOSIS — G43109 Migraine with aura, not intractable, without status migrainosus: Secondary | ICD-10-CM

## 2018-09-20 NOTE — Telephone Encounter (Signed)
Had spoke to you at appointment regarding migraine headache. She would like something called in for the break thru pain. She stated that she has had Tramadol  and butalbital. Would like something called in.

## 2018-09-20 NOTE — Telephone Encounter (Signed)
Will discuss with PCP Dr. Ancil Boozer.

## 2018-09-20 NOTE — Telephone Encounter (Signed)
Sally Mueller - please call patient, she has medication question.

## 2018-09-21 MED ORDER — BUTALBITAL-APAP-CAFFEINE 50-325-40 MG PO TABS: 1.0000 | ORAL_TABLET | Freq: Four times a day (QID) | ORAL | 0 refills | Status: DC | PRN

## 2018-09-21 NOTE — Telephone Encounter (Signed)
Butalbital is sent in.

## 2018-11-07 ENCOUNTER — Other Ambulatory Visit: Payer: Self-pay | Admitting: Family Medicine

## 2018-11-07 DIAGNOSIS — F411 Generalized anxiety disorder: Secondary | ICD-10-CM

## 2018-11-15 ENCOUNTER — Encounter: Payer: Self-pay | Admitting: Family Medicine

## 2018-12-07 ENCOUNTER — Encounter: Payer: Self-pay | Admitting: Family Medicine

## 2018-12-07 ENCOUNTER — Ambulatory Visit: Payer: 59 | Admitting: Family Medicine

## 2018-12-07 VITALS — BP 118/76 | HR 85 | Temp 98.0°F | Resp 16 | Ht 63.0 in | Wt 243.1 lb

## 2018-12-07 DIAGNOSIS — G43109 Migraine with aura, not intractable, without status migrainosus: Secondary | ICD-10-CM | POA: Diagnosis not present

## 2018-12-07 DIAGNOSIS — E8881 Metabolic syndrome: Secondary | ICD-10-CM | POA: Diagnosis not present

## 2018-12-07 DIAGNOSIS — F321 Major depressive disorder, single episode, moderate: Secondary | ICD-10-CM

## 2018-12-07 DIAGNOSIS — R519 Headache, unspecified: Secondary | ICD-10-CM

## 2018-12-07 DIAGNOSIS — F411 Generalized anxiety disorder: Secondary | ICD-10-CM

## 2018-12-07 DIAGNOSIS — E559 Vitamin D deficiency, unspecified: Secondary | ICD-10-CM

## 2018-12-07 DIAGNOSIS — R51 Headache: Secondary | ICD-10-CM

## 2018-12-07 DIAGNOSIS — E785 Hyperlipidemia, unspecified: Secondary | ICD-10-CM

## 2018-12-07 MED ORDER — ESCITALOPRAM OXALATE 10 MG PO TABS: 10.0000 mg | ORAL_TABLET | Freq: Every day | ORAL | 0 refills | Status: DC

## 2018-12-07 MED ORDER — PREDNISONE 10 MG PO TABS: 10.0000 mg | ORAL_TABLET | Freq: Two times a day (BID) | ORAL | 0 refills | Status: DC

## 2018-12-07 NOTE — Progress Notes (Signed)
Name: Sally Mueller   MRN: [id number]    DOB: 1976/07/14   Date:12/07/2018       Progress Note  Subjective  Chief Complaint  Chief Complaint  Patient presents with  . Medication Refill  . Migraine  . Depression  . Anxiety  . Metabolic syndrome    HPI  Migraine : she states since on Anjovi was not covered by insurance. She is on Topamax takes mostly once a daily, denies side effects, migraine headaches only severe right before her cycles. Otherwise she still has daily headaches. Discussed referral to headache center but she states she cannot afford it.   Major Depression:she has a long history of MDD, I recommended referral to psychiatrist many times and she states she cannot afford it. She is currently taking Lexapro, buspar and hydroxyzine every morning, states depression unchanged, but feels like anxiety is better controlled. Discussed risk of respiratory suppression, and serotonin syndrome. Advised to back down on hydroxyzine to prn only use. She states she will try to get help from psychiatrist from doctor on demand - app on her phone through her insurance. Advised to have them manage her medications  Phobia: she has panic attacks when she is groceries shopping, fear to go shopping, cannot make decisions, it is not helpful even with a list. When she goes she gets upset at herself because she needs to leave. Feels bad about herself.   GAD: she is off BZD's still has anxiety but better  Metabolic syndrome: she denies polyphagia, polydipsia or polyuria, not on medication    Patient Active Problem List   Diagnosis Date Noted  . Mixed incontinence urge and stress 09/07/2018  . Other situational type phobia 05/31/2018  . Moderate major depression ([place]) 02/23/2018  . Posterior neck pain 08/30/2015  . IBS (irritable bowel syndrome) 07/12/2015  . Anxiety, generalized 05/17/2015  . Headache disorder 05/17/2015  . Dysmenorrhea 05/17/2015  . Dysmetabolic syndrome 15/17/6160  .  Migraine with aura and without status migrainosus 05/17/2015  . Morbid obesity ([place]) 05/17/2015    Past Surgical History:  Procedure Laterality Date  . CESAREAN SECTION  2000, 2002  . CHOLECYSTECTOMY  2008?  [name] LIPOMA EXCISION  05/01/2014  . TUBAL LIGATION  2002  . TUMOR REMOVAL  2012?    Family History  Problem Relation Age of Onset  . Cardiomyopathy Mother   . Hypertension Mother   . Drug abuse Mother   . Hyperlipidemia Father   . Alcohol abuse Sister   . Drug abuse Sister   . Bipolar disorder Sister   . Schizophrenia Sister   . Breast cancer Neg Hx     Social History   Socioeconomic History  . Marital status: Soil scientist    Spouse name: Not on file  . Number of children: 3  . Years of education: Not on file  . Highest education level: Bachelor's degree (e.g., BA, AB, BS)  Occupational History  . Occupation: Pharmacist, hospital  Social Needs  . Financial resource strain: Somewhat hard  . Food insecurity:    Worry: Never true    Inability: Never true  . Transportation needs:    Medical: No    Non-medical: No  Tobacco Use  . Smoking status: Former Smoker    Years: 8.00    Types: Cigarettes    Start date: 11/17/1993    Last attempt to quit: 11/17/2001    Years since quitting: 17.0  . Smokeless tobacco: Never Used  Substance and Sexual Activity  . Alcohol  use: No    Alcohol/week: 0.0 standard drinks  . Drug use: No  . Sexual activity: Yes    Partners: Male    Birth control/protection: Other-see comments    Comment: Tubal Ligation  Lifestyle  . Physical activity:    Days per week: 0 days    Minutes per session: 0 min  . Stress: Not on file  Relationships  . Social connections:    Talks on phone: Once a week    Gets together: Once a week    Attends religious service: Never    Active member of club or organization: No    Attends meetings of clubs or organizations: Never    Relationship status: Living with partner  . Intimate partner violence:    Fear of  current or ex partner: No    Emotionally abused: No    Physically abused: No    Forced sexual activity: No  Other Topics Concern  . Not on file  Social History Narrative   She graduated from school of education     Current Outpatient Medications:  .  almotriptan (AXERT) 12.5 MG tablet, TAKE 1 TABLET BY MOUTH AT ONSET OF MIGRAINE, Disp: 9 tablet, Rfl: 0 .  busPIRone (BUSPAR) 7.5 MG tablet, Take 1 tablet (7.5 mg total) by mouth 3 (three) times daily. Take 1 tablet at night once daily x7 days, then increase to 1 tablet twice daily, then may increase to three times daily as needed, Disp: 60 tablet, Rfl: 1 .  butalbital-acetaminophen-caffeine (FIORICET, ESGIC) 50-325-40 MG tablet, Take 1-2 tablets by mouth every 6 (six) hours as needed for headache. Max 4 tablets in 24 hours., Disp: 20 tablet, Rfl: 0 .  clonazePAM (KLONOPIN) 0.5 MG tablet, TAKE 1 TABLET BY MOUTH EVERY DAY AS NEEDED FOR ANXIETY, Disp: 15 tablet, Rfl: 0 .  Fremanezumab-vfrm (AJOVY) 225 MG/1.5ML SOSY, Inject 225 mg into the skin every 30 (thirty) days., Disp: 1 Syringe, Rfl: 3 .  hydrOXYzine (ATARAX/VISTARIL) 10 MG tablet, Take 1 tablet (10 mg total) by mouth every 8 (eight) hours as needed., Disp: 90 tablet, Rfl: 2 .  nortriptyline (PAMELOR) 50 MG capsule, Take 1 capsule by mouth at bedtime., Disp: , Rfl:  .  promethazine (PHENERGAN) 25 MG tablet, Take 1 tablet (25 mg total) by mouth every 8 (eight) hours as needed., Disp: 30 tablet, Rfl: 0 .  topiramate (TOPAMAX) 100 MG tablet, TAKE 1 TABLET(100 MG) BY MOUTH TWICE DAILY AFTER TITRATION COMPLETE, Disp: 180 tablet, Rfl: 1 .  Vitamin D, Ergocalciferol, (DRISDOL) 50000 units CAPS capsule, Take 1 capsule (50,000 Units total) by mouth every 7 (seven) days., Disp: 12 capsule, Rfl: 0  Allergies  Allergen Reactions  . Sumatriptan     crawling sensation in her body, flushed    I personally reviewed active problem list, medication list, allergies, family history, social history with the  patient/caregiver today.   ROS  Constitutional: Negative for fever or weight change.  Respiratory: Negative for cough and shortness of breath.   Cardiovascular: Negative for chest pain or palpitations.  Gastrointestinal: Negative for abdominal pain, no bowel changes.  Musculoskeletal: Negative for gait problem or joint swelling.  Skin: Negative for rash.  Neurological: Negative for dizziness or headache.  No other specific complaints in a complete review of systems (except as listed in HPI above).  Objective  Vitals:   12/07/18 1608  BP: 118/76  Pulse: 85  Resp: 16  Temp: 98 F (36.7 C)  TempSrc: Oral  SpO2: 99%  Weight:  243 lb 1.6 oz (110.3 kg)  Height: 5\' 3"  (1.6 m)    Body mass index is 43.06 kg/m.  Physical Exam  Constitutional: Patient appears well-developed and well-nourished. Obese No distress.  HEENT: head atraumatic, normocephalic, pupils equal and reactive to light, neck supple, throat within normal limits Cardiovascular: Normal rate, regular rhythm and normal heart sounds.  No murmur heard. No BLE edema. Pulmonary/Chest: Effort normal and breath sounds normal. No respiratory distress. Abdominal: Soft.  There is no tenderness. Psychiatric: Patient has a normal mood and affect. behavior is normal. Judgment and thought content normal.  Recent Results (from the past 2160 hour(s))  Cytology - PAP     Status: None   Collection Time: 09/15/18 12:00 AM  Result Value Ref Range   Adequacy      Satisfactory for evaluation  endocervical/transformation zone component PRESENT.   Diagnosis      NEGATIVE FOR INTRAEPITHELIAL LESIONS OR MALIGNANCY.   HPV NOT DETECTED     Comment: Normal Reference Range - NOT Detected   Material Submitted CervicoVaginal Pap [ThinPrep Imaged]    CYTOLOGY - PAP PAP RESULT   Hepatitis C antibody     Status: None   Collection Time: 09/15/18  8:23 AM  Result Value Ref Range   Hepatitis C Ab NON-REACTIVE NON-REACTI   SIGNAL TO CUT-OFF 0.01  <1.00    Comment: . HCV antibody was non-reactive. There is no laboratory  evidence of HCV infection. . In most cases, no further action is required. However, if recent HCV exposure is suspected, a test for HCV RNA (test code [phone]) is suggested. . For additional information please refer to http://education.[facility].com/faq/FAQ22v1 (This link is being provided for informational/ educational purposes only.) .   COMPLETE METABOLIC PANEL WITH GFR     Status: None   Collection Time: 09/15/18  8:23 AM  Result Value Ref Range   Glucose, Bld 92 65 - 99 mg/dL    Comment: .            Fasting reference interval .    BUN 10 7 - 25 mg/dL   Creat 0.86 0.50 - 1.10 mg/dL   GFR, Est Non African American 83 > OR = 60 mL/min/1.21m2   GFR, Est African American 97 > OR = 60 mL/min/1.4m2   BUN/Creatinine Ratio NOT APPLICABLE 6 - 22 (calc)   Sodium 139 135 - 146 mmol/L   Potassium 3.8 3.5 - 5.3 mmol/L   Chloride 108 98 - 110 mmol/L   CO2 25 20 - 32 mmol/L   Calcium 9.1 8.6 - 10.2 mg/dL   Total Protein 6.3 6.1 - 8.1 g/dL   Albumin 4.0 3.6 - 5.1 g/dL   Globulin 2.3 1.9 - 3.7 g/dL (calc)   AG Ratio 1.7 1.0 - 2.5 (calc)   Total Bilirubin 0.4 0.2 - 1.2 mg/dL   Alkaline phosphatase (APISO) 66 33 - 115 U/L   AST 19 10 - 30 U/L   ALT 17 6 - 29 U/L  TSH     Status: None   Collection Time: 09/15/18  8:23 AM  Result Value Ref Range   TSH 3.02 mIU/L    Comment:           Reference Range .           > or = 20 Years  0.40-4.50 .                Pregnancy Ranges           First  trimester    0.26-2.66           Second trimester   0.55-2.73           Third trimester    0.43-2.91   Lipid panel     Status: Abnormal   Collection Time: 09/15/18  8:23 AM  Result Value Ref Range   Cholesterol 168 <200 mg/dL   HDL 36 (L) >50 mg/dL   Triglycerides 149 <150 mg/dL   LDL Cholesterol (Calc) 106 (H) mg/dL (calc)    Comment: Reference range: <100 . Desirable range <100 mg/dL for primary  prevention;   <70 mg/dL for patients with CHD or diabetic patients  with > or = 2 CHD risk factors. [name] LDL-C is now calculated using the Martin-Hopkins  calculation, which is a validated novel method providing  better accuracy than the Friedewald equation in the  estimation of LDL-C.  Cresenciano Genre et al. Annamaria Helling. 2423;536(14): 2061-2068  (http://education.[facility].com/faq/FAQ164)    Total CHOL/HDL Ratio 4.7 <5.0 (calc)   Non-HDL Cholesterol (Calc) 132 (H) <130 mg/dL (calc)    Comment: For patients with diabetes plus 1 major ASCVD risk  factor, treating to a non-HDL-C goal of <100 mg/dL  (LDL-C of <70 mg/dL) is considered a therapeutic  option.   Vitamin D (25 hydroxy)     Status: Abnormal   Collection Time: 09/15/18  8:23 AM  Result Value Ref Range   Vit D, 25-Hydroxy 22 (L) 30 - 100 ng/mL    Comment: Vitamin D Status         25-OH Vitamin D: . Deficiency:                    <20 ng/mL Insufficiency:             20 - 29 ng/mL Optimal:                 > or = 30 ng/mL . For 25-OH Vitamin D testing on patients on  D2-supplementation and patients for whom quantitation  of D2 and D3 fractions is required, the QuestAssureD(TM) 25-OH VIT D, (D2,D3), LC/MS/MS is recommended: order  code [id number] (patients >26yrs). . For more information on this test, go to: http://education.[facility].com/faq/FAQ163 (This link is being provided for  informational/educational purposes only.)   Hemoglobin A1c     Status: None   Collection Time: 09/15/18  8:23 AM  Result Value Ref Range   Hgb A1c MFr Bld 5.3 <5.7 % of total Hgb    Comment: For the purpose of screening for the presence of diabetes: . <5.7%       Consistent with the absence of diabetes 5.7-6.4%    Consistent with increased risk for diabetes             (prediabetes) > or =6.5%  Consistent with diabetes . This assay result is consistent with a decreased risk of diabetes. . Currently, no consensus exists regarding use  of hemoglobin A1c for diagnosis of diabetes in children. . According to American Diabetes Association (ADA) guidelines, hemoglobin A1c <7.0% represents optimal control in non-pregnant diabetic patients. Different metrics may apply to specific patient populations.  Standards of Medical Care in Diabetes(ADA). .    Mean Plasma Glucose 105 (calc)   eAG (mmol/L) 5.8 (calc)  CBC w/Diff/Platelet     Status: None   Collection Time: 09/15/18  8:23 AM  Result Value Ref Range   WBC 4.9 3.8 - 10.8 Thousand/uL   RBC 4.60 3.80 - 5.10 Million/uL  Hemoglobin 12.5 11.7 - 15.5 g/dL   HCT 37.8 35.0 - 45.0 %   MCV 82.2 80.0 - 100.0 fL   MCH 27.2 27.0 - 33.0 pg   MCHC 33.1 32.0 - 36.0 g/dL   RDW 13.3 11.0 - 15.0 %   Platelets 263 140 - 400 Thousand/uL   MPV 11.9 7.5 - 12.5 fL   Neutro Abs 3,028 1,500 - 7,800 cells/uL   Lymphs Abs 1,152 850 - 3,900 cells/uL   WBC mixed population 583 200 - 950 cells/uL   Eosinophils Absolute 88 15 - 500 cells/uL   Basophils Absolute 49 0 - 200 cells/uL   Neutrophils Relative % 61.8 %   Total Lymphocyte 23.5 %   Monocytes Relative 11.9 %   Eosinophils Relative 1.8 %   Basophils Relative 1.0 %  B12 and Folate Panel     Status: None   Collection Time: 09/15/18  8:23 AM  Result Value Ref Range   Vitamin B-12 292 200 - 1,100 pg/mL    Comment: . Please Note: Although the reference range for vitamin B12 is [phone] pg/mL, it has been reported that between 5 and 10% of patients with values between 200 and 400 pg/mL may experience neuropsychiatric and hematologic abnormalities due to occult B12 deficiency; less than 1% of patients with values above 400 pg/mL will have symptoms. .    Folate 8.4 ng/mL    Comment:                            Reference Range                            Low:           <3.4                            Borderline:    3.4-5.4                            Normal:        >5.4 .   Urinalysis, microscopic only     Status: None   Collection  Time: 09/15/18  8:23 AM  Result Value Ref Range   WBC, UA 0-5 0 - 5 /HPF   RBC / HPF NONE SEEN 0 - 2 /HPF   Squamous Epithelial / LPF 0-5 < OR = 5 /HPF   Bacteria, UA NONE SEEN NONE SEEN /HPF   Hyaline Cast NONE SEEN NONE SEEN /LPF  Urine Culture     Status: None   Collection Time: 09/15/18  8:23 AM  Result Value Ref Range   MICRO NUMBER: [id number]    SPECIMEN QUALITY: ADEQUATE    Sample Source URINE    STATUS: FINAL    Result: No Growth       PHQ2/9: Depression screen [facility] 2/9 12/07/2018 12/07/2018 09/15/2018 09/07/2018 05/31/2018  Decreased Interest 1 1 1 3 2   Down, Depressed, Hopeless 1 1 1 3 1   PHQ - 2 Score 2 2 2 6 3   Altered sleeping 2 2 3 3  0  Tired, decreased energy 2 2 3 3 1   Change in appetite 1 1 0 0 0  Feeling bad or failure about yourself  1 1 1  0 1  Trouble concentrating 1 1 1 1 3   Moving slowly or fidgety/restless 0  0 0 0 1  Suicidal thoughts 0 0 0 0 0  PHQ-9 Score 9 9 10 13 9   Difficult doing work/chores Somewhat difficult Somewhat difficult Somewhat difficult Somewhat difficult -  Some recent data might be hidden   GAD 7 : Generalized Anxiety Score 12/07/2018 05/31/2018 03/30/2018 02/23/2018  Nervous, Anxious, on Edge 2 3 2 2   Control/stop worrying 2 3 3 2   Worry too much - different things 2 3 3 1   Trouble relaxing 2 3 2 2   Restless 1 1 0 1  Easily annoyed or irritable 1 2 3 2   Afraid - awful might happen 1 3 0 1  Total GAD 7 Score 11 18 13 11   Anxiety Difficulty Somewhat difficult - - -     Fall Risk: Fall Risk  12/07/2018 09/15/2018 09/07/2018 05/31/2018 03/30/2018  Falls in the past year? 0 No No No No    Functional Status Survey: Is the patient deaf or have difficulty hearing?: No Does the patient have difficulty seeing, even when wearing glasses/contacts?: Yes Does the patient have difficulty concentrating, remembering, or making decisions?: No Does the patient have difficulty walking or climbing stairs?: No Does the patient have difficulty dressing  or bathing?: No Does the patient have difficulty doing errands alone such as visiting a doctor's office or shopping?: No    Assessment & Plan  1. Moderate major depression (HCC)  - escitalopram (LEXAPRO) 10 MG tablet; Take 1 tablet (10 mg total) by mouth daily.  Dispense: 30 tablet; Refill: 0  2. Anxiety, generalized  She will seek psychiatrist   3. Migraine with aura and without status migrainosus, not intractable  She wants to hold off on referral to psychiatrist  - predniSONE (DELTASONE) 10 MG tablet; Take 1 tablet (10 mg total) by mouth 2 (two) times daily with a meal. Prn headache only  Dispense: 10 tablet; Refill: 0  4. Dysmetabolic syndrome  Discussed life style modification   5. Headache disorder   6. Vitamin D deficiency   7. Dyslipidemia

## 2018-12-25 DIAGNOSIS — J101 Influenza due to other identified influenza virus with other respiratory manifestations: Secondary | ICD-10-CM | POA: Diagnosis not present

## 2019-01-14 ENCOUNTER — Encounter: Payer: Self-pay | Admitting: Family Medicine

## 2019-01-14 ENCOUNTER — Ambulatory Visit: Payer: 59 | Admitting: Family Medicine

## 2019-01-14 VITALS — BP 124/80 | HR 76 | Temp 98.3°F | Resp 16 | Ht 63.0 in | Wt 242.3 lb

## 2019-01-14 DIAGNOSIS — R1013 Epigastric pain: Secondary | ICD-10-CM | POA: Diagnosis not present

## 2019-01-14 DIAGNOSIS — K580 Irritable bowel syndrome with diarrhea: Secondary | ICD-10-CM | POA: Diagnosis not present

## 2019-01-14 DIAGNOSIS — R110 Nausea: Secondary | ICD-10-CM | POA: Diagnosis not present

## 2019-01-14 MED ORDER — ONDANSETRON HCL 4 MG PO TABS: 4.0000 mg | ORAL_TABLET | Freq: Three times a day (TID) | ORAL | 0 refills | Status: DC | PRN

## 2019-01-14 MED ORDER — OMEPRAZOLE 20 MG PO CPDR: 20.0000 mg | DELAYED_RELEASE_CAPSULE | Freq: Two times a day (BID) | ORAL | 1 refills | Status: DC

## 2019-01-14 MED ORDER — DICYCLOMINE HCL 20 MG PO TABS: 20.0000 mg | ORAL_TABLET | Freq: Four times a day (QID) | ORAL | 0 refills | Status: DC

## 2019-01-14 NOTE — Addendum Note (Signed)
Addended by: Iliani Vejar, Ulla Potash on: 01/14/2019 11:31 AM   Modules accepted: Orders

## 2019-01-14 NOTE — Progress Notes (Signed)
Name: Sally Mueller   MRN: [id number]    DOB: Feb 13, 1976   Date:01/14/2019       Progress Note  Subjective  Chief Complaint  Chief Complaint  Patient presents with  . Abdominal Pain    for 1 week with nausea    HPI  Pt presents with concern for abdominal pain x7 days.  Pain is epigastric and constant, wakes her up at night sometimes, has accompanying nausea.  Is s/p cholecystectomy. Has had 4 episodes of flushing in the last week. No heartburn or regurgitation, no vomiting or diarrhea, no fevers/chills, chest pain, or shortness of breath.  Took phenergan this morning and it didn't help.  Also has IBS, BM's have been normal, but does have some abdominal cramping.  Patient Active Problem List   Diagnosis Date Noted  . Mixed incontinence urge and stress 09/07/2018  . Other situational type phobia 05/31/2018  . Moderate major depression ([place]) 02/23/2018  . Posterior neck pain 08/30/2015  . IBS (irritable bowel syndrome) 07/12/2015  . Anxiety, generalized 05/17/2015  . Headache disorder 05/17/2015  . Dysmenorrhea 05/17/2015  . Dysmetabolic syndrome 97/35/3299  . Migraine with aura and without status migrainosus 05/17/2015  . Morbid obesity ([place]) 05/17/2015    Social History   Tobacco Use  . Smoking status: Former Smoker    Years: 8.00    Types: Cigarettes    Start date: 11/17/1993    Last attempt to quit: 11/17/2001    Years since quitting: 17.1  . Smokeless tobacco: Never Used  Substance Use Topics  . Alcohol use: No    Alcohol/week: 0.0 standard drinks     Current Outpatient Medications:  .  almotriptan (AXERT) 12.5 MG tablet, TAKE 1 TABLET BY MOUTH AT ONSET OF MIGRAINE, Disp: 9 tablet, Rfl: 0 .  busPIRone (BUSPAR) 7.5 MG tablet, Take 1 tablet (7.5 mg total) by mouth 3 (three) times daily. Take 1 tablet at night once daily x7 days, then increase to 1 tablet twice daily, then may increase to three times daily as needed, Disp: 60 tablet, Rfl: 1 .  escitalopram (LEXAPRO) 10  MG tablet, Take 1 tablet (10 mg total) by mouth daily., Disp: 30 tablet, Rfl: 0 .  hydrOXYzine (ATARAX/VISTARIL) 10 MG tablet, Take 1 tablet (10 mg total) by mouth every 8 (eight) hours as needed., Disp: 90 tablet, Rfl: 2 .  nortriptyline (PAMELOR) 50 MG capsule, Take 1 capsule by mouth at bedtime., Disp: , Rfl:  .  predniSONE (DELTASONE) 10 MG tablet, Take 1 tablet (10 mg total) by mouth 2 (two) times daily with a meal. Prn headache only, Disp: 10 tablet, Rfl: 0 .  promethazine (PHENERGAN) 25 MG tablet, Take 1 tablet (25 mg total) by mouth every 8 (eight) hours as needed., Disp: 30 tablet, Rfl: 0 .  topiramate (TOPAMAX) 100 MG tablet, TAKE 1 TABLET(100 MG) BY MOUTH TWICE DAILY AFTER TITRATION COMPLETE, Disp: 180 tablet, Rfl: 1 .  Vitamin D, Ergocalciferol, (DRISDOL) 50000 units CAPS capsule, Take 1 capsule (50,000 Units total) by mouth every 7 (seven) days., Disp: 12 capsule, Rfl: 0  Allergies  Allergen Reactions  . Sumatriptan     crawling sensation in her body, flushed    I personally reviewed active problem list, medication list, allergies, notes from last encounter, lab results with the patient/caregiver today.  ROS  Constitutional: Negative for fever or weight change.  Respiratory: Negative for cough and shortness of breath.   Cardiovascular: Negative for chest pain or palpitations.  Gastrointestinal: See  HPI Musculoskeletal: Negative for gait problem or joint swelling.  Skin: Negative for rash.  Neurological: Negative for dizziness or headache.  No other specific complaints in a complete review of systems (except as listed in HPI above).  Objective  Vitals:   01/14/19 1104  BP: 124/80  Pulse: 76  Resp: 16  Temp: 98.3 F (36.8 C)  TempSrc: Oral  SpO2: 96%  Weight: 242 lb 4.8 oz (109.9 kg)  Height: 5\' 3"  (1.6 m)    Body mass index is 42.92 kg/m.  Nursing Note and Vital Signs reviewed.  Physical Exam  Constitutional: Patient appears well-developed and  well-nourished. No distress.  HENT: Head: Normocephalic and atraumatic.  Neck: Normal range of motion. Neck supple. No JVD present. No thyromegaly present.  Cardiovascular: Normal rate, regular rhythm and normal heart sounds.  No murmur heard. No BLE edema. Pulmonary/Chest: Effort normal and breath sounds normal. No respiratory distress. Abdominal: Soft. Bowel sounds are normal, no distension. There is no tenderness. No masses. No CVA tenderness. Musculoskeletal: Normal range of motion, no joint effusions. No gross deformities Neurological: Pt is alert and oriented to person, place, and time. No cranial nerve deficit. Coordination, balance, strength, speech and gait are normal.  Skin: Skin is warm and dry. No rash noted. No erythema.  Psychiatric: Patient has a normal mood and affect. behavior is normal. Judgment and thought content normal.  No results found for this or any previous visit (from the past 72 hour(s)).  Assessment & Plan  1. Epigastric pain - H. pylori breath test - COMPLETE METABOLIC PANEL WITH GFR; Future - CBC w/Diff/Platelet - omeprazole (PRILOSEC) 20 MG capsule; Take 1 capsule (20 mg total) by mouth 2 (two) times daily before a meal.  Dispense: 60 capsule; Refill: 1 - ondansetron (ZOFRAN) 4 MG tablet; Take 1 tablet (4 mg total) by mouth every 8 (eight) hours as needed for nausea or vomiting.  Dispense: 20 tablet; Refill: 0 - dicyclomine (BENTYL) 20 MG tablet; Take 1 tablet (20 mg total) by mouth every 6 (six) hours.  Dispense: 20 tablet; Refill: 0  2. Nausea - COMPLETE METABOLIC PANEL WITH GFR; Future - CBC w/Diff/Platelet - ondansetron (ZOFRAN) 4 MG tablet; Take 1 tablet (4 mg total) by mouth every 8 (eight) hours as needed for nausea or vomiting.  Dispense: 20 tablet; Refill: 0  3. Irritable bowel syndrome with diarrhea - dicyclomine (BENTYL) 20 MG tablet; Take 1 tablet (20 mg total) by mouth every 6 (six) hours.  Dispense: 20 tablet; Refill: 0  -Red flags and when  to present for emergency care or RTC including fever >101.73F, chest pain, shortness of breath, new/worsening/un-resolving symptoms, severe abdominal pain, blood in stools, intractable vomiting reviewed with patient at time of visit. Follow up and care instructions discussed and provided in AVS.

## 2019-01-14 NOTE — Patient Instructions (Signed)

## 2019-01-17 LAB — COMPLETE METABOLIC PANEL WITH GFR
AG RATIO: 1.6 (calc) (ref 1.0–2.5)
ALBUMIN MSPROF: 3.9 g/dL (ref 3.6–5.1)
ALT: 16 U/L (ref 6–29)
AST: 16 U/L (ref 10–30)
Alkaline phosphatase (APISO): 66 U/L (ref 31–125)
BUN: 10 mg/dL (ref 7–25)
CO2: 24 mmol/L (ref 20–32)
Calcium: 8.7 mg/dL (ref 8.6–10.2)
Chloride: 110 mmol/L (ref 98–110)
Creat: 0.73 mg/dL (ref 0.50–1.10)
GFR, Est African American: 118 mL/min/{1.73_m2} (ref >=60)
GFR, Est Non African American: 102 mL/min/{1.73_m2} (ref >=60)
Globulin: 2.5 g/dL (calc) (ref 1.9–3.7)
Glucose, Bld: 102 mg/dL — ABNORMAL HIGH (ref 65–99)
POTASSIUM: 4 mmol/L (ref 3.5–5.3)
Sodium: 140 mmol/L (ref 135–146)
Total Bilirubin: 0.6 mg/dL (ref 0.2–1.2)
Total Protein: 6.4 g/dL (ref 6.1–8.1)

## 2019-01-17 LAB — CBC WITH DIFFERENTIAL/PLATELET
ABSOLUTE MONOCYTES: 616 {cells}/uL (ref 200–950)
BASOS PCT: 0.5 %
Basophils Absolute: 38 cells/uL (ref 0–200)
EOS ABS: 68 {cells}/uL (ref 15–500)
Eosinophils Relative: 0.9 %
HEMATOCRIT: 37.2 % (ref 35.0–45.0)
HEMOGLOBIN: 12 g/dL (ref 11.7–15.5)
LYMPHS ABS: 2280 {cells}/uL (ref 850–3900)
MCH: 26.8 pg — ABNORMAL LOW (ref 27.0–33.0)
MCHC: 32.3 g/dL (ref 32.0–36.0)
MCV: 83.2 fL (ref 80.0–100.0)
MPV: 12.4 fL (ref 7.5–12.5)
Monocytes Relative: 8.1 %
Neutro Abs: 4598 cells/uL (ref 1500–7800)
Neutrophils Relative %: 60.5 %
PLATELETS: 250 10*3/uL (ref 140–400)
RBC: 4.47 10*6/uL (ref 3.80–5.10)
RDW: 13.1 % (ref 11.0–15.0)
TOTAL LYMPHOCYTE: 30 %
WBC: 7.6 10*3/uL (ref 3.8–10.8)

## 2019-01-17 LAB — H. PYLORI BREATH TEST: H. pylori Breath Test: NOT DETECTED

## 2019-03-09 ENCOUNTER — Ambulatory Visit: Payer: Self-pay | Admitting: Family Medicine

## 2019-04-14 ENCOUNTER — Other Ambulatory Visit: Payer: Self-pay | Admitting: Family Medicine

## 2019-04-14 DIAGNOSIS — F411 Generalized anxiety disorder: Secondary | ICD-10-CM

## 2019-04-14 DIAGNOSIS — F321 Major depressive disorder, single episode, moderate: Secondary | ICD-10-CM

## 2019-04-18 ENCOUNTER — Other Ambulatory Visit: Payer: Self-pay | Admitting: Family Medicine

## 2019-04-18 ENCOUNTER — Ambulatory Visit (INDEPENDENT_AMBULATORY_CARE_PROVIDER_SITE_OTHER): Payer: BC Managed Care – PPO | Admitting: Family Medicine

## 2019-04-18 ENCOUNTER — Encounter: Payer: Self-pay | Admitting: Family Medicine

## 2019-04-18 DIAGNOSIS — G43109 Migraine with aura, not intractable, without status migrainosus: Secondary | ICD-10-CM

## 2019-04-18 DIAGNOSIS — F320 Major depressive disorder, single episode, mild: Secondary | ICD-10-CM

## 2019-04-18 DIAGNOSIS — F411 Generalized anxiety disorder: Secondary | ICD-10-CM

## 2019-04-18 DIAGNOSIS — E8881 Metabolic syndrome: Secondary | ICD-10-CM

## 2019-04-18 MED ORDER — HYDROXYZINE HCL 10 MG PO TABS: 10.0000 mg | ORAL_TABLET | Freq: Three times a day (TID) | ORAL | 2 refills | Status: DC | PRN

## 2019-04-18 MED ORDER — VITAMIN D3 25 MCG (1000 UT) PO CAPS: 1.0000 | ORAL_CAPSULE | Freq: Every day | ORAL | 0 refills | Status: DC

## 2019-04-18 MED ORDER — ESCITALOPRAM OXALATE 10 MG PO TABS: ORAL_TABLET | ORAL | 0 refills | Status: DC

## 2019-04-18 MED ORDER — TOPIRAMATE 100 MG PO TABS: ORAL_TABLET | ORAL | 1 refills | Status: DC

## 2019-04-18 NOTE — Progress Notes (Signed)
Name: Sally Mueller   MRN: [id number]    DOB: 1976/09/21   Date:04/18/2019       Progress Note  Subjective  Chief Complaint  Chief Complaint  Patient presents with  . Depression  . Anxiety  . Migraine    I connected with  Duanne Limerick  on 04/18/19 at  9:40 AM EDT by a video enabled telemedicine application and verified that I am speaking with the correct person using two identifiers.  I discussed the limitations of evaluation and management by telemedicine and the availability of in person appointments. The patient expressed understanding and agreed to proceed. Staff also discussed with the patient that there may be a patient responsible charge related to this service. Patient Location: at home  Provider Location: [facility]  HPI  Migraine : She is on Topamax takes mostly once a daily, denies side effects, migraine headaches only severe right before her cycles, two since last visit . She still has mild headaches 4-5 times a week but not migraine like, sometimes she takes Excedrin migraine   Major Depression:she has a long history of MDD, I recommended referral to psychiatrist many times and she states she cannot afford it. She states she has been working from home and since April she has not been taking medications on a regular basis because of change in her routine. However when she went back to her part time job everyone said she was very anxious so she resumed hydroxyzine but still not taking Lexapro but willing to resume it now. She has a 94 month old grandson  Phobia: she has panic attacks when she is groceries shopping, fear to go shopping, cannot make decisions, it is not helpful even with a list. When she goes she gets upset at herself because she needs to leave. GAD7 is higher but Phq 9 is stable.   GAD:she is off BZD's , off lexapro but will resumed it now, taking hydroxyzine prn  Metabolic syndrome: she denies polyphagia, polydipsia or polyuria, not on  medication Discussed life style modification  Morbid obesity: discussed life style modification   Patient Active Problem List   Diagnosis Date Noted  . Mixed incontinence urge and stress 09/07/2018  . Other situational type phobia 05/31/2018  . Moderate major depression ([place]) 02/23/2018  . Posterior neck pain 08/30/2015  . IBS (irritable bowel syndrome) 07/12/2015  . Anxiety, generalized 05/17/2015  . Headache disorder 05/17/2015  . Dysmenorrhea 05/17/2015  . Dysmetabolic syndrome 43/15/4008  . Migraine with aura and without status migrainosus 05/17/2015  . Morbid obesity ([place]) 05/17/2015    Past Surgical History:  Procedure Laterality Date  . CESAREAN SECTION  2000, 2002  . CHOLECYSTECTOMY  2008?  [name] LIPOMA EXCISION  05/01/2014  . TUBAL LIGATION  2002  . TUMOR REMOVAL  2012?    Family History  Problem Relation Age of Onset  . Cardiomyopathy Mother   . Hypertension Mother   . Drug abuse Mother   . Hyperlipidemia Father   . Alcohol abuse Sister   . Drug abuse Sister   . Bipolar disorder Sister   . Schizophrenia Sister   . Breast cancer Neg Hx     Social History   Socioeconomic History  . Marital status: Soil scientist    Spouse name: Not on file  . Number of children: 3  . Years of education: Not on file  . Highest education level: Bachelor's degree (e.g., BA, AB, BS)  Occupational History  . Occupation: Pharmacist, hospital  Social Needs  . Financial resource strain: Somewhat hard  . Food insecurity:    Worry: Never true    Inability: Never true  . Transportation needs:    Medical: No    Non-medical: No  Tobacco Use  . Smoking status: Former Smoker    Years: 8.00    Types: Cigarettes    Start date: 11/17/1993    Last attempt to quit: 11/17/2001    Years since quitting: 17.4  . Smokeless tobacco: Never Used  Substance and Sexual Activity  . Alcohol use: No    Alcohol/week: 0.0 standard drinks  . Drug use: No  . Sexual activity: Yes    Partners: Male    Birth  control/protection: Other-see comments    Comment: Tubal Ligation  Lifestyle  . Physical activity:    Days per week: 0 days    Minutes per session: 0 min  . Stress: Not on file  Relationships  . Social connections:    Talks on phone: Once a week    Gets together: Once a week    Attends religious service: Never    Active member of club or organization: No    Attends meetings of clubs or organizations: Never    Relationship status: Living with partner  . Intimate partner violence:    Fear of current or ex partner: No    Emotionally abused: No    Physically abused: No    Forced sexual activity: No  Other Topics Concern  . Not on file  Social History Narrative   She graduated from school of education     Current Outpatient Medications:  .  almotriptan (AXERT) 12.5 MG tablet, TAKE 1 TABLET BY MOUTH AT ONSET OF MIGRAINE, Disp: 9 tablet, Rfl: 0 .  escitalopram (LEXAPRO) 10 MG tablet, TAKE 1 TABLET(10 MG) BY MOUTH DAILY, Disp: 30 tablet, Rfl: 0 .  hydrOXYzine (ATARAX/VISTARIL) 10 MG tablet, Take 1 tablet (10 mg total) by mouth every 8 (eight) hours as needed., Disp: 90 tablet, Rfl: 2 .  nortriptyline (PAMELOR) 50 MG capsule, Take 1 capsule by mouth at bedtime., Disp: , Rfl:  .  ondansetron (ZOFRAN) 4 MG tablet, Take 1 tablet (4 mg total) by mouth every 8 (eight) hours as needed for nausea or vomiting., Disp: 20 tablet, Rfl: 0 .  predniSONE (DELTASONE) 10 MG tablet, Take 1 tablet (10 mg total) by mouth 2 (two) times daily with a meal. Prn headache only, Disp: 10 tablet, Rfl: 0 .  promethazine (PHENERGAN) 25 MG tablet, Take 1 tablet (25 mg total) by mouth every 8 (eight) hours as needed., Disp: 30 tablet, Rfl: 0 .  topiramate (TOPAMAX) 100 MG tablet, TAKE 1 TABLET(100 MG) BY MOUTH TWICE DAILY AFTER TITRATION COMPLETE, Disp: 180 tablet, Rfl: 1 .  busPIRone (BUSPAR) 7.5 MG tablet, Take 1 tablet (7.5 mg total) by mouth 3 (three) times daily. Take 1 tablet at night once daily x7 days, then  increase to 1 tablet twice daily, then may increase to three times daily as needed (Patient not taking: Reported on 04/18/2019), Disp: 60 tablet, Rfl: 1 .  dicyclomine (BENTYL) 20 MG tablet, Take 1 tablet (20 mg total) by mouth every 6 (six) hours. (Patient not taking: Reported on 04/18/2019), Disp: 20 tablet, Rfl: 0 .  omeprazole (PRILOSEC) 20 MG capsule, Take 1 capsule (20 mg total) by mouth 2 (two) times daily before a meal. (Patient not taking: Reported on 04/18/2019), Disp: 60 capsule, Rfl: 1 .  Vitamin D, Ergocalciferol, (DRISDOL) 50000 units  CAPS capsule, Take 1 capsule (50,000 Units total) by mouth every 7 (seven) days. (Patient not taking: Reported on 04/18/2019), Disp: 12 capsule, Rfl: 0  Allergies  Allergen Reactions  . Sumatriptan     crawling sensation in her body, flushed    I personally reviewed active problem list, medication list, allergies, family history, social history with the patient/caregiver today.   ROS  Ten systems reviewed and is negative except as mentioned in HPI   Objective  Virtual encounter, vitals not obtained.  There is no height or weight on file to calculate BMI.  Physical Exam  Awake, alert and oriented  PHQ2/9: Depression screen [facility] 2/9 04/18/2019 01/14/2019 12/07/2018 12/07/2018 09/15/2018  Decreased Interest 1 0 1 1 1   Down, Depressed, Hopeless 1 0 1 1 1   PHQ - 2 Score 2 0 2 2 2   Altered sleeping 1 2 2 2 3   Tired, decreased energy 1 2 2 2 3   Change in appetite 1 0 1 1 0  Feeling bad or failure about yourself  0 1 1 1 1   Trouble concentrating 0 1 1 1 1   Moving slowly or fidgety/restless 0 0 0 0 0  Suicidal thoughts 0 0 0 0 0  PHQ-9 Score 5 6 9 9 10   Difficult doing work/chores Somewhat difficult Somewhat difficult Somewhat difficult Somewhat difficult Somewhat difficult  Some recent data might be hidden   PHQ-2/9 Result is positive.    GAD 7 : Generalized Anxiety Score 04/18/2019 12/07/2018 05/31/2018 03/30/2018  Nervous, Anxious, on Edge 1 2 3 2    Control/stop worrying 3 2 3 3   Worry too much - different things 3 2 3 3   Trouble relaxing 1 2 3 2   Restless 0 1 1 0  Easily annoyed or irritable 3 1 2 3   Afraid - awful might happen 0 1 3 0  Total GAD 7 Score 11 11 18 13   Anxiety Difficulty Somewhat difficult Somewhat difficult - -     Fall Risk: Fall Risk  04/18/2019 01/14/2019 12/07/2018 09/15/2018 09/07/2018  Falls in the past year? 0 0 0 No No  Number falls in past yr: 0 0 - - -  Injury with Fall? 0 0 - - -  Follow up - Falls evaluation completed - - -     Assessment & Plan  1. Mild major depression (HCC)  - escitalopram (LEXAPRO) 10 MG tablet; TAKE 1 TABLET(10 MG) BY MOUTH DAILY  Dispense: 90 tablet; Refill: 0  2. Morbid obesity, unspecified obesity type [facility])  Discussed with the patient the risk posed by an increased BMI. Discussed importance of portion control, calorie counting and at least 150 minutes of physical activity weekly. Avoid sweet beverages and drink more water. Eat at least 6 servings of fruit and vegetables daily   3. Dysmetabolic syndrome   4. Migraine with aura and without status migrainosus, not intractable  Doing better  5. Anxiety, generalized  - hydrOXYzine (ATARAX/VISTARIL) 10 MG tablet; Take 1 tablet (10 mg total) by mouth every 8 (eight) hours as needed.  Dispense: 90 tablet; Refill: 2   I discussed the assessment and treatment plan with the patient. The patient was provided an opportunity to ask questions and all were answered. The patient agreed with the plan and demonstrated an understanding of the instructions.  The patient was advised to call back or seek an in-person evaluation if the symptoms worsen or if the condition fails to improve as anticipated.  I provided 25  minutes of non-face-to-face  time during this encounter.

## 2019-06-07 ENCOUNTER — Encounter: Payer: Self-pay | Admitting: Family Medicine

## 2019-06-20 ENCOUNTER — Encounter: Payer: Self-pay | Admitting: Family Medicine

## 2019-06-20 ENCOUNTER — Other Ambulatory Visit: Payer: Self-pay | Admitting: Family Medicine

## 2019-06-20 DIAGNOSIS — G43109 Migraine with aura, not intractable, without status migrainosus: Secondary | ICD-10-CM

## 2019-06-20 MED ORDER — PREDNISONE 10 MG PO TABS
10.0000 mg | ORAL_TABLET | Freq: Two times a day (BID) | ORAL | 0 refills | Status: DC
Start: 2019-06-20 — End: 2020-10-16

## 2019-07-20 ENCOUNTER — Ambulatory Visit: Payer: Self-pay | Admitting: Family Medicine

## 2019-09-07 ENCOUNTER — Other Ambulatory Visit: Payer: Self-pay

## 2019-09-07 DIAGNOSIS — Z20822 Contact with and (suspected) exposure to covid-19: Secondary | ICD-10-CM

## 2019-09-08 LAB — NOVEL CORONAVIRUS, NAA: SARS-CoV-2, NAA: NOT DETECTED

## 2019-09-09 ENCOUNTER — Other Ambulatory Visit: Payer: Self-pay | Admitting: Family Medicine

## 2019-09-09 ENCOUNTER — Encounter: Payer: Self-pay | Admitting: Family Medicine

## 2019-09-09 DIAGNOSIS — G43109 Migraine with aura, not intractable, without status migrainosus: Secondary | ICD-10-CM

## 2019-09-09 MED ORDER — ALMOTRIPTAN MALATE 12.5 MG PO TABS: ORAL_TABLET | ORAL | 0 refills | Status: DC

## 2020-01-10 ENCOUNTER — Ambulatory Visit: Payer: Self-pay

## 2020-01-12 ENCOUNTER — Ambulatory Visit: Payer: Self-pay

## 2020-06-18 ENCOUNTER — Ambulatory Visit: Payer: Self-pay | Admitting: Family Medicine

## 2020-10-15 NOTE — Progress Notes (Addendum)
Name: Sally Mueller   MRN: [id number]    DOB: 06/26/1976   Date:10/16/2020       Progress Note  Subjective  Chief Complaint  Anxiety  HPI  Migraine : She states since she changed her job episodes of migraines were seldom, but since stress level is higher she has been having more episodes again, most of the time prior to her cycle, she states pain is sharp behind left eye and can radiated to the frontal area and more intense. Associated with nausea when severe.   Major Depression:she has a long history of MDD, she used to take Lexapro and stopped once she changed jobs, but recently feeling out of control. Having to move, middle son is being deployed, Rolla Plate is still at home, she is crying more , unable to function, she states sometimes wants to stay in bed all day, skipping meals because she cannot make choices even about what to eat   Phobia: she has panic attacks when she is groceries shopping, fear to go shopping, cannot make decisions, it is not helpful even with a list. When she goes she gets upset at herself because she needs to leave. GAD7 is higher but Phq 9 is stable.   GAD:she had a new job as a Oncologist and since she was happy and it was Summer time in 2020 and she stopped taking all her medications Lexapro and Hydroxyzine . She states she was trying to buy a house but because of housing market and the cost of the homes, 3 weeks ago she was given a 30 day notice to leave the house she has been renting for the past 18 years. She is feeling out of control, crying, feeling jittery inside and would like to resume all her medication . She states she has a place to move to now but she is feeling so overwhelmed that she has not been able to pack to move  Metabolic syndrome: she denies polyphagia, polydipsia or polyuria, not on medication Discussed life style modification, we will recheck labs today   Morbid obesity: discussed life style modification , BMI is above 40    Patient Active Problem List   Diagnosis Date Noted  . Mixed incontinence urge and stress 09/07/2018  . Other situational type phobia 05/31/2018  . Moderate major depression ([place]) 02/23/2018  . Posterior neck pain 08/30/2015  . IBS (irritable bowel syndrome) 07/12/2015  . Anxiety, generalized 05/17/2015  . Headache disorder 05/17/2015  . Dysmenorrhea 05/17/2015  . Dysmetabolic syndrome 35/00/9381  . Migraine with aura and without status migrainosus 05/17/2015  . Morbid obesity ([place]) 05/17/2015    Past Surgical History:  Procedure Laterality Date  . CESAREAN SECTION  2000, 2002  . CHOLECYSTECTOMY  2008?  [name] LIPOMA EXCISION  05/01/2014  . TUBAL LIGATION  2002  . TUMOR REMOVAL  2012?    Family History  Problem Relation Age of Onset  . Cardiomyopathy Mother   . Hypertension Mother   . Drug abuse Mother   . Hyperlipidemia Father   . Alcohol abuse Sister   . Drug abuse Sister   . Bipolar disorder Sister   . Schizophrenia Sister   . Breast cancer Neg Hx     Social History   Tobacco Use  . Smoking status: Former Smoker    Years: 8.00    Types: Cigarettes    Start date: 11/17/1993    Quit date: 11/17/2001    Years since quitting: 18.9  . Smokeless tobacco: Never Used  Substance Use Topics  . Alcohol use: No    Alcohol/week: 0.0 standard drinks     Current Outpatient Medications:  .  almotriptan (AXERT) 12.5 MG tablet, may repeat in 2 hours if needed, Disp: 9 tablet, Rfl: 0 .  promethazine (PHENERGAN) 25 MG tablet, Take 1 tablet (25 mg total) by mouth every 8 (eight) hours as needed., Disp: 30 tablet, Rfl: 0 .  Cholecalciferol (VITAMIN D3) 25 MCG (1000 UT) CAPS, Take 1 capsule (1,000 Units total) by mouth daily. (Patient not taking: Reported on 10/16/2020), Disp: 30 capsule, Rfl: 0 .  dicyclomine (BENTYL) 20 MG tablet, Take 1 tablet (20 mg total) by mouth every 6 (six) hours. (Patient not taking: Reported on 04/18/2019), Disp: 20 tablet, Rfl: 0 .  escitalopram (LEXAPRO) 10  MG tablet, TAKE 1 TABLET(10 MG) BY MOUTH DAILY (Patient not taking: Reported on 10/16/2020), Disp: 90 tablet, Rfl: 0 .  hydrOXYzine (ATARAX/VISTARIL) 10 MG tablet, Take 1 tablet (10 mg total) by mouth every 8 (eight) hours as needed. (Patient not taking: Reported on 10/16/2020), Disp: 90 tablet, Rfl: 2 .  nortriptyline (PAMELOR) 50 MG capsule, Take 1 capsule by mouth at bedtime. (Patient not taking: Reported on 10/16/2020), Disp: , Rfl:  .  omeprazole (PRILOSEC) 20 MG capsule, Take 1 capsule (20 mg total) by mouth 2 (two) times daily before a meal. (Patient not taking: Reported on 04/18/2019), Disp: 60 capsule, Rfl: 1 .  ondansetron (ZOFRAN) 4 MG tablet, Take 1 tablet (4 mg total) by mouth every 8 (eight) hours as needed for nausea or vomiting. (Patient not taking: Reported on 10/16/2020), Disp: 20 tablet, Rfl: 0 .  predniSONE (DELTASONE) 10 MG tablet, Take 1 tablet (10 mg total) by mouth 2 (two) times daily with a meal. Prn headache only (Patient not taking: Reported on 10/16/2020), Disp: 10 tablet, Rfl: 0 .  topiramate (TOPAMAX) 100 MG tablet, TAKE 1 TABLET(100 MG) BY MOUTH TWICE DAILY AFTER TITRATION COMPLETE (Patient not taking: Reported on 10/16/2020), Disp: 180 tablet, Rfl: 1  Allergies  Allergen Reactions  . Sumatriptan     crawling sensation in her body, flushed    I personally reviewed active problem list, medication list, allergies, family history, social history, health maintenance, notes from last encounter with the patient/caregiver today.   ROS  Constitutional: Negative for fever or weight change.  Respiratory: Negative for cough and shortness of breath.   Cardiovascular: Negative for chest pain or palpitations.  Gastrointestinal: Negative for abdominal pain, no bowel changes.  Musculoskeletal: Negative for gait problem or joint swelling.  Skin: Negative for rash.  Neurological: Negative for dizziness or headache.  No other specific complaints in a complete review of systems  (except as listed in HPI above).  Objective  Vitals:   10/16/20 1521  BP: 122/78  Pulse: 99  Resp: 17  Temp: 98.2 F (36.8 C)  TempSrc: Oral  SpO2: 99%  Weight: 251 lb 1.6 oz (113.9 kg)  Height: 5\' 3"  (1.6 m)    Body mass index is 44.48 kg/m.  Physical Exam  Constitutional: Patient appears well-developed and well-nourished. Obese  No distress.  HEENT: head atraumatic, normocephalic, pupils equal and reactive to light, neck supple Cardiovascular: Normal rate, regular rhythm and normal heart sounds.  No murmur heard. No BLE edema. Pulmonary/Chest: Effort normal and breath sounds normal. No respiratory distress. Abdominal: Soft.  There is no tenderness. Psychiatric: Patient has a normal mood and affect. behavior is normal. Judgment and thought content normal.   PHQ2/9: Depression screen [facility] 2/9  10/16/2020 04/18/2019 01/14/2019 12/07/2018 12/07/2018  Decreased Interest 1 1 0 1 1  Down, Depressed, Hopeless 2 1 0 1 1  PHQ - 2 Score 3 2 0 2 2  Altered sleeping 3 1 2 2 2   Tired, decreased energy 3 1 2 2 2   Change in appetite 3 1 0 1 1  Feeling bad or failure about yourself  2 0 1 1 1   Trouble concentrating 2 0 1 1 1   Moving slowly or fidgety/restless 0 0 0 0 0  Suicidal thoughts 0 0 0 0 0  PHQ-9 Score 16 5 6 9 9   Difficult doing work/chores Very difficult Somewhat difficult Somewhat difficult Somewhat difficult Somewhat difficult  Some recent data might be hidden    phq 9 is positive   Fall Risk: Fall Risk  10/16/2020 04/18/2019 01/14/2019 12/07/2018 09/15/2018  Falls in the past year? 0 0 0 0 No  Number falls in past yr: 0 0 0 - -  Injury with Fall? 0 0 0 - -  Follow up - - Falls evaluation completed - -    Functional Status Survey: Is the patient deaf or have difficulty hearing?: No Does the patient have difficulty seeing, even when wearing glasses/contacts?: No Does the patient have difficulty concentrating, remembering, or making decisions?: No Does the patient have  difficulty walking or climbing stairs?: No Does the patient have difficulty dressing or bathing?: No Does the patient have difficulty doing errands alone such as visiting a doctor's office or shopping?: No   Assessment & Plan  1. Migraine with aura and without status migrainosus, not intractable  - almotriptan (AXERT) 12.5 MG tablet; may repeat in 2 hours if needed  Dispense: 9 tablet; Refill: 1 - topiramate (TOPAMAX) 100 MG tablet; TAKE 1 TABLET(100 MG) BY MOUTH TWICE DAILY AFTER TITRATION COMPLETE  Dispense: 180 tablet; Refill: 1 - promethazine (PHENERGAN) 25 MG tablet; Take 1 tablet (25 mg total) by mouth every 8 (eight) hours as needed.  Dispense: 30 tablet; Refill: 0  2. Dysmetabolic syndrome  - COMPLETE METABOLIC PANEL WITH GFR - Hemoglobin A1c  3. Anxiety, generalized  - hydrOXYzine (ATARAX/VISTARIL) 10 MG tablet; Take 1 tablet (10 mg total) by mouth at bedtime as needed.  Dispense: 90 tablet; Refill: 1  We will also add Buspar 5 mg up to TID prn   4. Moderate major depression (HCC)  - CBC with Differential/Platelet - COMPLETE METABOLIC PANEL WITH GFR - Vitamin B12  5. Morbid obesity ([place])  Discussed with the patient the risk posed by an increased BMI. Discussed importance of portion control, calorie counting and at least 150 minutes of physical activity weekly. Avoid sweet beverages and drink more water. Eat at least 6 servings of fruit and vegetables daily   6. Irritable bowel syndrome with diarrhea   7. Dyslipidemia  - Lipid panel  8. Vitamin D deficiency  - VITAMIN D 25 Hydroxy (Vit-D Deficiency, Fractures)  9. B12 deficiency  - Vitamin B12  10. Mild major depression (HCC)  - escitalopram (LEXAPRO) 10 MG tablet; TAKE 1 TABLET(10 MG) BY MOUTH DAILY  Dispense: 90 tablet; Refill: 0   11. Nausea

## 2020-10-16 ENCOUNTER — Encounter: Payer: Self-pay | Admitting: Family Medicine

## 2020-10-16 ENCOUNTER — Ambulatory Visit: Payer: BC Managed Care – PPO | Admitting: Family Medicine

## 2020-10-16 ENCOUNTER — Other Ambulatory Visit: Payer: Self-pay

## 2020-10-16 VITALS — BP 122/78 | HR 99 | Temp 98.2°F | Resp 17 | Ht 63.0 in | Wt 251.1 lb

## 2020-10-16 DIAGNOSIS — G43109 Migraine with aura, not intractable, without status migrainosus: Secondary | ICD-10-CM | POA: Diagnosis not present

## 2020-10-16 DIAGNOSIS — E559 Vitamin D deficiency, unspecified: Secondary | ICD-10-CM

## 2020-10-16 DIAGNOSIS — F411 Generalized anxiety disorder: Secondary | ICD-10-CM

## 2020-10-16 DIAGNOSIS — E8881 Metabolic syndrome: Secondary | ICD-10-CM

## 2020-10-16 DIAGNOSIS — K580 Irritable bowel syndrome with diarrhea: Secondary | ICD-10-CM

## 2020-10-16 DIAGNOSIS — F321 Major depressive disorder, single episode, moderate: Secondary | ICD-10-CM | POA: Diagnosis not present

## 2020-10-16 DIAGNOSIS — E785 Hyperlipidemia, unspecified: Secondary | ICD-10-CM

## 2020-10-16 DIAGNOSIS — F320 Major depressive disorder, single episode, mild: Secondary | ICD-10-CM

## 2020-10-16 DIAGNOSIS — R110 Nausea: Secondary | ICD-10-CM

## 2020-10-16 DIAGNOSIS — E538 Deficiency of other specified B group vitamins: Secondary | ICD-10-CM

## 2020-10-16 MED ORDER — HYDROXYZINE HCL 10 MG PO TABS: 10.0000 mg | ORAL_TABLET | Freq: Every evening | ORAL | 1 refills | Status: DC | PRN

## 2020-10-16 MED ORDER — TOPIRAMATE 100 MG PO TABS: ORAL_TABLET | ORAL | 1 refills | Status: DC

## 2020-10-16 MED ORDER — PROMETHAZINE HCL 25 MG PO TABS: 25.0000 mg | ORAL_TABLET | Freq: Three times a day (TID) | ORAL | 0 refills | Status: DC | PRN

## 2020-10-16 MED ORDER — ALMOTRIPTAN MALATE 12.5 MG PO TABS: ORAL_TABLET | ORAL | 1 refills | Status: DC

## 2020-10-16 MED ORDER — BUSPIRONE HCL 5 MG PO TABS: 5.0000 mg | ORAL_TABLET | Freq: Three times a day (TID) | ORAL | 0 refills | Status: DC | PRN

## 2020-10-16 MED ORDER — ESCITALOPRAM OXALATE 10 MG PO TABS: ORAL_TABLET | ORAL | 0 refills | Status: DC

## 2020-10-16 NOTE — Addendum Note (Signed)
Addended by: Steele Sizer F on: 10/16/2020 04:10 PM   Modules accepted: Orders

## 2020-10-17 LAB — CBC WITH DIFFERENTIAL/PLATELET
Absolute Monocytes: 598 cells/uL (ref 200–950)
Basophils Absolute: 50 cells/uL (ref 0–200)
Basophils Relative: 0.6 %
Eosinophils Absolute: 91 cells/uL (ref 15–500)
Eosinophils Relative: 1.1 %
HCT: 35 % (ref 35.0–45.0)
Hemoglobin: 11 g/dL — ABNORMAL LOW (ref 11.7–15.5)
Lymphs Abs: 2797 cells/uL (ref 850–3900)
MCH: 24.2 pg — ABNORMAL LOW (ref 27.0–33.0)
MCHC: 31.4 g/dL — ABNORMAL LOW (ref 32.0–36.0)
MCV: 77.1 fL — ABNORMAL LOW (ref 80.0–100.0)
MPV: 11.9 fL (ref 7.5–12.5)
Monocytes Relative: 7.2 %
Neutro Abs: 4764 cells/uL (ref 1500–7800)
Neutrophils Relative %: 57.4 %
Platelets: 276 10*3/uL (ref 140–400)
RBC: 4.54 10*6/uL (ref 3.80–5.10)
RDW: 14.1 % (ref 11.0–15.0)
Total Lymphocyte: 33.7 %
WBC: 8.3 10*3/uL (ref 3.8–10.8)

## 2020-10-17 LAB — HEMOGLOBIN A1C
Hgb A1c MFr Bld: 5.3 % of total Hgb (ref <5.7)
Mean Plasma Glucose: 105 (calc)
eAG (mmol/L): 5.8 (calc)

## 2020-10-17 LAB — LIPID PANEL
Cholesterol: 186 mg/dL (ref <200)
HDL: 39 mg/dL — ABNORMAL LOW (ref >=50)
LDL Cholesterol (Calc): 125 mg/dL (calc) — ABNORMAL HIGH
Non-HDL Cholesterol (Calc): 147 mg/dL (calc) — ABNORMAL HIGH (ref <130)
Total CHOL/HDL Ratio: 4.8 (calc) (ref <5.0)
Triglycerides: 111 mg/dL (ref <150)

## 2020-10-17 LAB — COMPLETE METABOLIC PANEL WITH GFR
AG Ratio: 1.8 (calc) (ref 1.0–2.5)
ALT: 13 U/L (ref 6–29)
AST: 16 U/L (ref 10–30)
Albumin: 4.4 g/dL (ref 3.6–5.1)
Alkaline phosphatase (APISO): 64 U/L (ref 31–125)
BUN: 14 mg/dL (ref 7–25)
CO2: 27 mmol/L (ref 20–32)
Calcium: 9.5 mg/dL (ref 8.6–10.2)
Chloride: 106 mmol/L (ref 98–110)
Creat: 0.64 mg/dL (ref 0.50–1.10)
GFR, Est African American: 126 mL/min/{1.73_m2} (ref >=60)
GFR, Est Non African American: 109 mL/min/{1.73_m2} (ref >=60)
Globulin: 2.5 g/dL (calc) (ref 1.9–3.7)
Glucose, Bld: 99 mg/dL (ref 65–99)
Potassium: 4.5 mmol/L (ref 3.5–5.3)
Sodium: 141 mmol/L (ref 135–146)
Total Bilirubin: 0.6 mg/dL (ref 0.2–1.2)
Total Protein: 6.9 g/dL (ref 6.1–8.1)

## 2020-10-17 LAB — VITAMIN D 25 HYDROXY (VIT D DEFICIENCY, FRACTURES): Vit D, 25-Hydroxy: 20 ng/mL — ABNORMAL LOW (ref 30–100)

## 2020-10-17 LAB — VITAMIN B12: Vitamin B-12: 299 pg/mL (ref 200–1100)

## 2020-11-18 ENCOUNTER — Encounter: Payer: Self-pay | Admitting: Family Medicine

## 2020-11-19 ENCOUNTER — Telehealth (INDEPENDENT_AMBULATORY_CARE_PROVIDER_SITE_OTHER): Payer: BC Managed Care – PPO | Admitting: Family Medicine

## 2020-11-19 ENCOUNTER — Encounter: Payer: Self-pay | Admitting: Family Medicine

## 2020-11-19 ENCOUNTER — Other Ambulatory Visit: Payer: Self-pay

## 2020-11-19 DIAGNOSIS — U071 COVID-19: Secondary | ICD-10-CM | POA: Diagnosis not present

## 2020-11-19 MED ORDER — BENZONATATE 100 MG PO CAPS: 100.0000 mg | ORAL_CAPSULE | Freq: Two times a day (BID) | ORAL | 0 refills | Status: DC | PRN

## 2020-11-19 MED ORDER — ALBUTEROL SULFATE HFA 108 (90 BASE) MCG/ACT IN AERS: 2.0000 | INHALATION_SPRAY | Freq: Four times a day (QID) | RESPIRATORY_TRACT | 0 refills | Status: DC | PRN

## 2020-11-19 NOTE — Progress Notes (Signed)
Name: Sally Mueller   MRN: [id number]    DOB: 1976/02/08   Date:11/19/2020       Progress Note  Subjective  Chief Complaint  COVID Positive  I connected with  Duanne Limerick  on 11/19/20 at  1:00 PM EST by a video enabled telemedicine application and verified that I am speaking with the correct person using two identifiers.  I discussed the limitations of evaluation and management by telemedicine and the availability of in person appointments. The patient expressed understanding and agreed to proceed with the virtual visit  Staff also discussed with the patient that there may be a patient responsible charge related to this service. Patient Location: at home  Provider Location: [facility] Additional Individuals present: husband and son   HPI  COVID-19: symptoms started like cold symptoms 6 days ago, followed by headache and fever 24 hours later. She was tested 11/15/2020 and it was positive for COVID-19. She has a dry cough, some burning sensation inside her chest, also burning sensation inside her nostril, scratchy throat and headaches. She is also having difficulty sleeping - chest feels heavy and makes her anxious. She has not been checking her pulse oximeter, but advised her to start checking her pulse ox. Discussed staying hydrated, rest.   She had all COVID-19 vaccines.      Patient Active Problem List   Diagnosis Date Noted  . Mixed incontinence urge and stress 09/07/2018  . Other situational type phobia 05/31/2018  . Moderate major depression ([place]) 02/23/2018  . Posterior neck pain 08/30/2015  . IBS (irritable bowel syndrome) 07/12/2015  . Anxiety, generalized 05/17/2015  . Headache disorder 05/17/2015  . Dysmenorrhea 05/17/2015  . Dysmetabolic syndrome 99991111  . Migraine with aura and without status migrainosus 05/17/2015  . Morbid obesity ([place]) 05/17/2015    Past Surgical History:  Procedure Laterality Date  . CESAREAN SECTION  2000, 2002  . CHOLECYSTECTOMY  2008?  [name]  LIPOMA EXCISION  05/01/2014  . TUBAL LIGATION  2002  . TUMOR REMOVAL  2012?    Family History  Problem Relation Age of Onset  . Cardiomyopathy Mother   . Hypertension Mother   . Drug abuse Mother   . Hyperlipidemia Father   . Alcohol abuse Sister   . Drug abuse Sister   . Bipolar disorder Sister   . Schizophrenia Sister   . Breast cancer Neg Hx     Social History   Socioeconomic History  . Marital status: Soil scientist    Spouse name: Not on file  . Number of children: 3  . Years of education: Not on file  . Highest education level: Bachelor's degree (e.g., BA, AB, BS)  Occupational History  . Occupation: Pharmacist, hospital  Tobacco Use  . Smoking status: Former Smoker    Years: 8.00    Types: Cigarettes    Start date: 11/17/1993    Quit date: 11/17/2001    Years since quitting: 19.0  . Smokeless tobacco: Never Used  Vaping Use  . Vaping Use: Never used  Substance and Sexual Activity  . Alcohol use: No    Alcohol/week: 0.0 standard drinks  . Drug use: No  . Sexual activity: Yes    Partners: Male    Birth control/protection: Other-see comments    Comment: Tubal Ligation  Other Topics Concern  . 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 [employer] Fall of 2020   Social Determinants of Health   Financial Resource Strain: Not on file  Food Insecurity: Not on file  Transportation Needs: Not on file  Physical Activity: Not on file  Stress: Not on file  Social Connections: Not on file  Intimate Partner Violence: Not on file     Current Outpatient Medications:  .  albuterol (VENTOLIN HFA) 108 (90 Base) MCG/ACT inhaler, Inhale 2 puffs into the lungs every 6 (six) hours as needed for wheezing or shortness of breath., Disp: 8 g, Rfl: 0 .  almotriptan (AXERT) 12.5 MG tablet, may repeat in 2 hours if needed, Disp: 9 tablet, Rfl: 1 .  benzonatate (TESSALON) 100  MG capsule, Take 1-2 capsules (100-200 mg total) by mouth 2 (two) times daily as needed., Disp: 40 capsule, Rfl: 0 .  busPIRone (BUSPAR) 5 MG tablet, Take 1 tablet (5 mg total) by mouth 3 (three) times daily as needed., Disp: 30 tablet, Rfl: 0 .  escitalopram (LEXAPRO) 10 MG tablet, TAKE 1 TABLET(10 MG) BY MOUTH DAILY, Disp: 90 tablet, Rfl: 0 .  hydrOXYzine (ATARAX/VISTARIL) 10 MG tablet, Take 1 tablet (10 mg total) by mouth at bedtime as needed., Disp: 90 tablet, Rfl: 1 .  promethazine (PHENERGAN) 25 MG tablet, Take 1 tablet (25 mg total) by mouth every 8 (eight) hours as needed., Disp: 30 tablet, Rfl: 0 .  Cholecalciferol (VITAMIN D3) 25 MCG (1000 UT) CAPS, Take 1 capsule (1,000 Units total) by mouth daily. (Patient not taking: No sig reported), Disp: 30 capsule, Rfl: 0 .  topiramate (TOPAMAX) 100 MG tablet, TAKE 1 TABLET(100 MG) BY MOUTH TWICE DAILY AFTER TITRATION COMPLETE (Patient not taking: Reported on 11/19/2020), Disp: 180 tablet, Rfl: 1  Allergies  Allergen Reactions  . Sumatriptan     crawling sensation in her body, flushed    I personally reviewed active problem list, medication list, allergies, family history, social history with the patient/caregiver today.   ROS  Ten systems reviewed and is negative except as mentioned in HPI   Objective  Virtual encounter, vitals not obtained.  There is no height or weight on file to calculate BMI.  Physical Exam  Awake, alert and oriented  PHQ2/9: Depression screen [facility] 2/9 11/19/2020 10/16/2020 04/18/2019 01/14/2019 12/07/2018  Decreased Interest 0 1 1 0 1  Down, Depressed, Hopeless 2 2 1  0 1  PHQ - 2 Score 2 3 2  0 2  Altered sleeping 1 3 1 2 2   Tired, decreased energy 0 3 1 2 2   Change in appetite 0 3 1 0 1  Feeling bad or failure about yourself  0 2 0 1 1  Trouble concentrating 0 2 0 1 1  Moving slowly or fidgety/restless 0 0 0 0 0  Suicidal thoughts 0 0 0 0 0  PHQ-9 Score 3 16 5 6 9   Difficult doing work/chores - Very difficult  Somewhat difficult Somewhat difficult Somewhat difficult  Some recent data might be hidden   PHQ-2/9 Result is positive.    Fall Risk: Fall Risk  11/19/2020 10/16/2020 04/18/2019 01/14/2019 12/07/2018  Falls in the past year? 0 0 0 0 0  Number falls in past yr: 0 0 0 0 -  Injury with Fall? 0 0 0 0 -  Follow up - - - Falls evaluation completed -     Assessment & Plan  1. COVID-19  - benzonatate (TESSALON) 100 MG capsule; Take 1-2 capsules (100-200 mg total) by mouth 2 (two) times daily as needed.  Dispense: 40 capsule; Refill: 0 - albuterol (VENTOLIN HFA) 108 (90 Base) MCG/ACT inhaler; Inhale 2 puffs into the lungs every 6 (six) hours as needed for wheezing or shortness of breath.  Dispense: 8 g; Refill: 0  She refused in home COVID monitoring  I discussed the assessment and treatment plan with the patient. The patient was provided an opportunity to ask questions and all were answered. The patient agreed with the plan and demonstrated an understanding of the instructions.  The patient was advised to call back or seek an in-person evaluation if the symptoms worsen or if the condition fails to improve as anticipated.  I provided 15  minutes of non-face-to-face time during this encounter.

## 2020-12-04 ENCOUNTER — Ambulatory Visit: Payer: BC Managed Care – PPO | Admitting: Family Medicine

## 2020-12-04 ENCOUNTER — Other Ambulatory Visit: Payer: Self-pay

## 2020-12-04 ENCOUNTER — Encounter: Payer: Self-pay | Admitting: Family Medicine

## 2020-12-04 VITALS — BP 128/86 | HR 93 | Temp 98.4°F | Resp 16 | Ht 63.0 in | Wt 252.0 lb

## 2020-12-04 DIAGNOSIS — F418 Other specified anxiety disorders: Secondary | ICD-10-CM

## 2020-12-04 DIAGNOSIS — E538 Deficiency of other specified B group vitamins: Secondary | ICD-10-CM | POA: Diagnosis not present

## 2020-12-04 DIAGNOSIS — Z8616 Personal history of COVID-19: Secondary | ICD-10-CM

## 2020-12-04 DIAGNOSIS — F321 Major depressive disorder, single episode, moderate: Secondary | ICD-10-CM

## 2020-12-04 DIAGNOSIS — D649 Anemia, unspecified: Secondary | ICD-10-CM

## 2020-12-04 DIAGNOSIS — F411 Generalized anxiety disorder: Secondary | ICD-10-CM

## 2020-12-04 MED ORDER — CLONAZEPAM 0.5 MG PO TABS: 0.5000 mg | ORAL_TABLET | Freq: Two times a day (BID) | ORAL | 0 refills | Status: DC | PRN

## 2020-12-04 MED ORDER — ATENOLOL 25 MG PO TABS: 25.0000 mg | ORAL_TABLET | Freq: Every day | ORAL | 0 refills | Status: DC

## 2020-12-04 MED ORDER — CYANOCOBALAMIN 1000 MCG/ML IJ SOLN
1000.0000 ug | Freq: Once | INTRAMUSCULAR | Status: AC
  Administered 2020-12-04: 1000 ug via INTRAMUSCULAR

## 2020-12-04 NOTE — Progress Notes (Signed)
Name: Sally Mueller   MRN: [id number]    DOB: 14-Feb-1976   Date:12/04/2020       Progress Note  Subjective  Chief Complaint  Follow Up  HPI  COVID-19:  She was tested 11/15/2020 and it was positive for COVID-19. She still has mild fatigue, dry cough but otherwise feeling back to normal self.   Major Depression:she has a long history of MDD, she used to take Lexapro and stopped once she changed jobs, however during the Fall of 2021 she noticed return of symptoms.  She was moving, middle son getting ready to deploy to [place], youngest son still at home and they don't get along. She noticed increase in crying spells , unable to function, she was staying in bed most of the day, feeling overwhelmed,  skipping meals because she cannot make choices even about what to eat . We resumed lexapro, gave her some Buspar and hydroxyzine. She states she is feeling better now. Buspar did not work, hydroxyzine is okay for the evening. She is not sure if lexapro works but phq 9 has improved. 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 Discussed importance of therapy but she is not sure if she has time or can afford it. She is doing breathing exercises. She states before meetings/ she is the leader for kindergarten teachers she feels clammy, shaky , gets blotchy . We will try Atenolol for performance anxiety   Anemia unspecified: we will check CBC and for iron deficiency anemia, B12 was low and she is not taking supplementation,she will get an injection today   Patient Active Problem List   Diagnosis Date Noted  . Mixed incontinence urge and stress 09/07/2018  . Other situational type phobia 05/31/2018  . Moderate major depression ([place]) 02/23/2018  . Posterior neck pain 08/30/2015  . IBS (irritable bowel syndrome) 07/12/2015  . Anxiety, generalized 05/17/2015  . Headache disorder 05/17/2015  . Dysmenorrhea 05/17/2015  . Dysmetabolic syndrome [id number]  . Migraine with aura and without status  migrainosus 05/17/2015  . Morbid obesity ([place]) 05/17/2015    Past Surgical History:  Procedure Laterality Date  . CESAREAN SECTION  2000, 2002  . CHOLECYSTECTOMY  2008?  [name] LIPOMA EXCISION  05/01/2014  . TUBAL LIGATION  2002  . TUMOR REMOVAL  2012?    Family History  Problem Relation Age of Onset  . Cardiomyopathy Mother   . Hypertension Mother   . Drug abuse Mother   . Hyperlipidemia Father   . Alcohol abuse Sister   . Drug abuse Sister   . Bipolar disorder Sister   . Schizophrenia Sister   . Breast cancer Neg Hx     Social History   Tobacco Use  . Smoking status: Former Smoker    Years: 8.00    Types: Cigarettes    Start date: 11/17/1993    Quit date: 11/17/2001    Years since quitting: 19.0  . Smokeless tobacco: Never Used  Substance Use Topics  . Alcohol use: No    Alcohol/week: 0.0 standard drinks     Current Outpatient Medications:  .  atenolol (TENORMIN) 25 MG tablet, Take 1 tablet (25 mg total) by mouth daily., Disp: 90 tablet, Rfl: 0 .  clonazePAM (KLONOPIN) 0.5 MG tablet, Take 1 tablet (0.5 mg total) by mouth 2 (two) times daily as needed for anxiety., Disp: 10 tablet, Rfl: 0 .  escitalopram (LEXAPRO) 10 MG tablet, TAKE 1 TABLET(10 MG) BY MOUTH DAILY, Disp: 90 tablet, Rfl: 0 .  hydrOXYzine (ATARAX/VISTARIL) 10 MG tablet, Take 1 tablet (10 mg total) by mouth at bedtime as needed., Disp: 90 tablet, Rfl: 1 .  promethazine (PHENERGAN) 25 MG tablet, Take 1 tablet (25 mg total) by mouth every 8 (eight) hours as needed., Disp: 30 tablet, Rfl: 0 .  almotriptan (AXERT) 12.5 MG tablet, may repeat in 2 hours if needed (Patient not taking: Reported on 12/04/2020), Disp: 9 tablet, Rfl: 1 .  Cholecalciferol (VITAMIN D3) 25 MCG (1000 UT) CAPS, Take 1 capsule (1,000 Units total) by mouth daily. (Patient not taking: Reported on 12/04/2020), Disp: 30 capsule, Rfl: 0  Allergies  Allergen Reactions  . Sumatriptan     crawling sensation in her body, flushed    I personally  reviewed active problem list, medication list, allergies, family history, social history, health maintenance with the patient/caregiver today.   ROS  Constitutional: Negative for fever or weight change.  Respiratory: positive  for mild cough bit no  shortness of breath.   Cardiovascular: Negative for chest pain or palpitations.  Gastrointestinal: Negative for abdominal pain, no bowel changes.  Musculoskeletal: Negative for gait problem or joint swelling.  Skin: Negative for rash.  Neurological: Negative for dizziness or headache.  No other specific complaints in a complete review of systems (except as listed in HPI above).  Objective  Vitals:   12/04/20 1551  BP: 128/86  Pulse: 93  Resp: 16  Temp: 98.4 F (36.9 C)  TempSrc: Oral  SpO2: 99%  Weight: 252 lb (114.3 kg)  Height: 5\' 3"  (1.6 m)    Body mass index is 44.64 kg/m.  Physical Exam  Constitutional: Patient appears well-developed and well-nourished. Obese No distress.  HEENT: head atraumatic, normocephalic, pupils equal and reactive to light,neck supple Cardiovascular: Normal rate, regular rhythm and normal heart sounds.  No murmur heard. No BLE edema. Pulmonary/Chest: Effort normal and breath sounds normal. No respiratory distress. Abdominal: Soft.  There is no tenderness. Psychiatric: Patient has a normal mood and affect. behavior is normal. Judgment and thought content normal.  Recent Results (from the past 2160 hour(s))  Lipid panel     Status: Abnormal   Collection Time: 10/16/20  4:15 PM  Result Value Ref Range   Cholesterol 186 <200 mg/dL   HDL 39 (L) > OR = 50 mg/dL   Triglycerides 111 <150 mg/dL   LDL Cholesterol (Calc) 125 (H) mg/dL (calc)    Comment: Reference range: <100 . Desirable range <100 mg/dL for primary prevention;   <70 mg/dL for patients with CHD or diabetic patients  with > or = 2 CHD risk factors. [name] LDL-C is now calculated using the Martin-Hopkins  calculation, which is a validated  novel method providing  better accuracy than the Friedewald equation in the  estimation of LDL-C.  Cresenciano Genre et al. Annamaria Helling. 4166;063(01): 2061-2068  (http://education.[facility].com/faq/FAQ164)    Total CHOL/HDL Ratio 4.8 <5.0 (calc)   Non-HDL Cholesterol (Calc) 147 (H) <130 mg/dL (calc)    Comment: For patients with diabetes plus 1 major ASCVD risk  factor, treating to a non-HDL-C goal of <100 mg/dL  (LDL-C of <70 mg/dL) is considered a therapeutic  option.   CBC with Differential/Platelet     Status: Abnormal   Collection Time: 10/16/20  4:15 PM  Result Value Ref Range   WBC 8.3 3.8 - 10.8 Thousand/uL   RBC 4.54 3.80 - 5.10 Million/uL   Hemoglobin 11.0 (L) 11.7 - 15.5 g/dL   HCT 35.0 35.0 - 45.0 %   MCV 77.1 (L)  80.0 - 100.0 fL   MCH 24.2 (L) 27.0 - 33.0 pg   MCHC 31.4 (L) 32.0 - 36.0 g/dL   RDW 14.1 11.0 - 15.0 %   Platelets 276 140 - 400 Thousand/uL   MPV 11.9 7.5 - 12.5 fL   Neutro Abs 4,764 1,500 - 7,800 cells/uL   Lymphs Abs 2,797 850 - 3,900 cells/uL   Absolute Monocytes 598 200 - 950 cells/uL   Eosinophils Absolute 91 15 - 500 cells/uL   Basophils Absolute 50 0 - 200 cells/uL   Neutrophils Relative % 57.4 %   Total Lymphocyte 33.7 %   Monocytes Relative 7.2 %   Eosinophils Relative 1.1 %   Basophils Relative 0.6 %  COMPLETE METABOLIC PANEL WITH GFR     Status: None   Collection Time: 10/16/20  4:15 PM  Result Value Ref Range   Glucose, Bld 99 65 - 99 mg/dL    Comment: .            Fasting reference interval .    BUN 14 7 - 25 mg/dL   Creat 0.64 0.50 - 1.10 mg/dL   GFR, Est Non African American 109 > OR = 60 mL/min/1.80m2   GFR, Est African American 126 > OR = 60 mL/min/1.74m2   BUN/Creatinine Ratio NOT APPLICABLE 6 - 22 (calc)   Sodium 141 135 - 146 mmol/L   Potassium 4.5 3.5 - 5.3 mmol/L   Chloride 106 98 - 110 mmol/L   CO2 27 20 - 32 mmol/L   Calcium 9.5 8.6 - 10.2 mg/dL   Total Protein 6.9 6.1 - 8.1 g/dL   Albumin 4.4 3.6 - 5.1 g/dL   Globulin  2.5 1.9 - 3.7 g/dL (calc)   AG Ratio 1.8 1.0 - 2.5 (calc)   Total Bilirubin 0.6 0.2 - 1.2 mg/dL   Alkaline phosphatase (APISO) 64 31 - 125 U/L   AST 16 10 - 30 U/L   ALT 13 6 - 29 U/L  Vitamin B12     Status: None   Collection Time: 10/16/20  4:15 PM  Result Value Ref Range   Vitamin B-12 299 200 - 1,100 pg/mL    Comment: . Please Note: Although the reference range for vitamin B12 is [phone] pg/mL, it has been reported that between 5 and 10% of patients with values between 200 and 400 pg/mL may experience neuropsychiatric and hematologic abnormalities due to occult B12 deficiency; less than 1% of patients with values above 400 pg/mL will have symptoms. [name]   VITAMIN D 25 Hydroxy (Vit-D Deficiency, Fractures)     Status: Abnormal   Collection Time: 10/16/20  4:15 PM  Result Value Ref Range   Vit D, 25-Hydroxy 20 (L) 30 - 100 ng/mL    Comment: Vitamin D Status         25-OH Vitamin D: . Deficiency:                    <20 ng/mL Insufficiency:             20 - 29 ng/mL Optimal:                 > or = 30 ng/mL . For 25-OH Vitamin D testing on patients on  D2-supplementation and patients for whom quantitation  of D2 and D3 fractions is required, the [facility](TM) 25-OH VIT D, (D2,D3), LC/MS/MS is recommended: order  code [id number] (patients >64yrs). See Note 1 . Note 1 . For additional information, please refer  to  http://education.[facility].com/faq/FAQ199  (This link is being provided for informational/ educational purposes only.)   Hemoglobin A1c     Status: None   Collection Time: 10/16/20  4:15 PM  Result Value Ref Range   Hgb A1c MFr Bld 5.3 <5.7 % of total Hgb    Comment: For the purpose of screening for the presence of diabetes: . <5.7%       Consistent with the absence of diabetes 5.7-6.4%    Consistent with increased risk for diabetes             (prediabetes) > or =6.5%  Consistent with diabetes . This assay result is consistent with a decreased risk of  diabetes. . Currently, no consensus exists regarding use of hemoglobin A1c for diagnosis of diabetes in children. . According to American Diabetes Association (ADA) guidelines, hemoglobin A1c <7.0% represents optimal control in non-pregnant diabetic patients. Different metrics may apply to specific patient populations.  Standards of Medical Care in Diabetes(ADA). .    Mean Plasma Glucose 105 (calc)   eAG (mmol/L) 5.8 (calc)     PHQ2/9: Depression screen [facility] 2/9 12/04/2020 11/19/2020 10/16/2020 04/18/2019 01/14/2019  Decreased Interest 0 0 1 1 0  Down, Depressed, Hopeless 0 2 2 1  0  PHQ - 2 Score 0 2 3 2  0  Altered sleeping 1 1 3 1 2   Tired, decreased energy 1 0 3 1 2   Change in appetite 0 0 3 1 0  Feeling bad or failure about yourself  0 0 2 0 1  Trouble concentrating 0 0 2 0 1  Moving slowly or fidgety/restless 0 0 0 0 0  Suicidal thoughts 0 0 0 0 0  PHQ-9 Score 2 3 16 5 6   Difficult doing work/chores Not difficult at all - Very difficult Somewhat difficult Somewhat difficult  Some recent data might be hidden    phq 9 is negative   Fall Risk: Fall Risk  12/04/2020 11/19/2020 10/16/2020 04/18/2019 01/14/2019  Falls in the past year? 0 0 0 0 0  Number falls in past yr: 0 0 0 0 0  Injury with Fall? 0 0 0 0 0  Follow up - - - - Falls evaluation completed     Functional Status Survey: Is the patient deaf or have difficulty hearing?: No Does the patient have difficulty seeing, even when wearing glasses/contacts?: No Does the patient have difficulty concentrating, remembering, or making decisions?: No Does the patient have difficulty walking or climbing stairs?: No Does the patient have difficulty dressing or bathing?: No Does the patient have difficulty doing errands alone such as visiting a doctor's office or shopping?: No   Assessment & Plan  1. Anxiety, generalized  Continue medications, we will give 10 alprazolam's to last until next visit for panic attacks only  2.  Moderate major depression ([place])   3. History of COVID-19   4. Performance anxiety  - atenolol (TENORMIN) 25 MG tablet; Take 1 tablet (25 mg total) by mouth daily.  Dispense: 90 tablet; Refill: 0  5. Anemia, unspecified type  - CBC with Differential/Platelet - Iron, TIBC and Ferritin Panel  6. B12 deficiency  She needs to resume supplementation, she would like b12 shots toda

## 2020-12-09 ENCOUNTER — Encounter: Payer: Self-pay | Admitting: Family Medicine

## 2021-01-23 ENCOUNTER — Encounter: Payer: Self-pay | Admitting: Family Medicine

## 2021-01-23 NOTE — Progress Notes (Signed)
Name: Sally Mueller   MRN: [id number]    DOB: April 23, 1976   Date:01/24/2021       Progress Note  Subjective  Chief Complaint  Migraine  HPI  Migraine : episodes usually around her cycle, however this time it was the week after , started 5 days ago, symptoms started with scotomas on left visual field, nausea, vomiting, she states initially very intense and sharp behind left eye, hot feeling on frontal area. She took Axert but did not work this time. She still has mild dizziness, nausea, allodynia , pain is dull 4/10 , she has been able to go to work but tired of the pain.   Fever blister: she needs a refill of Valtrex   Patient Active Problem List   Diagnosis Date Noted  . Mixed incontinence urge and stress 09/07/2018  . Other situational type phobia 05/31/2018  . Moderate major depression ([place]) 02/23/2018  . Posterior neck pain 08/30/2015  . IBS (irritable bowel syndrome) 07/12/2015  . Anxiety, generalized 05/17/2015  . Headache disorder 05/17/2015  . Dysmenorrhea 05/17/2015  . Dysmetabolic syndrome 27/78/2423  . Migraine with aura and without status migrainosus 05/17/2015  . Morbid obesity ([place]) 05/17/2015    Past Surgical History:  Procedure Laterality Date  . CESAREAN SECTION  2000, 2002  . CHOLECYSTECTOMY  2008?  [name] LIPOMA EXCISION  05/01/2014  . TUBAL LIGATION  2002  . TUMOR REMOVAL  2012?    Family History  Problem Relation Age of Onset  . Cardiomyopathy Mother   . Hypertension Mother   . Drug abuse Mother   . Hyperlipidemia Father   . Alcohol abuse Sister   . Drug abuse Sister   . Bipolar disorder Sister   . Schizophrenia Sister   . Breast cancer Neg Hx     Social History   Tobacco Use  . Smoking status: Former Smoker    Years: 8.00    Types: Cigarettes    Start date: 11/17/1993    Quit date: 11/17/2001    Years since quitting: 19.2  . Smokeless tobacco: Never Used  Substance Use Topics  . Alcohol use: No    Alcohol/week: 0.0 standard drinks      Current Outpatient Medications:  .  almotriptan (AXERT) 12.5 MG tablet, may repeat in 2 hours if needed, Disp: 9 tablet, Rfl: 1 .  atenolol (TENORMIN) 25 MG tablet, Take 1 tablet (25 mg total) by mouth daily., Disp: 90 tablet, Rfl: 0 .  Cholecalciferol (VITAMIN D3) 25 MCG (1000 UT) CAPS, Take 1 capsule (1,000 Units total) by mouth daily., Disp: 30 capsule, Rfl: 0 .  clonazePAM (KLONOPIN) 0.5 MG tablet, Take 1 tablet (0.5 mg total) by mouth 2 (two) times daily as needed for anxiety., Disp: 10 tablet, Rfl: 0 .  escitalopram (LEXAPRO) 10 MG tablet, TAKE 1 TABLET(10 MG) BY MOUTH DAILY, Disp: 90 tablet, Rfl: 0 .  promethazine (PHENERGAN) 25 MG tablet, Take 1 tablet (25 mg total) by mouth every 8 (eight) hours as needed., Disp: 30 tablet, Rfl: 0 .  hydrOXYzine (ATARAX/VISTARIL) 10 MG tablet, Take 1 tablet (10 mg total) by mouth at bedtime as needed. (Patient not taking: Reported on 01/24/2021), Disp: 90 tablet, Rfl: 1  Allergies  Allergen Reactions  . Sumatriptan     crawling sensation in her body, flushed    I personally reviewed active problem list, medication list, allergies, family history, social history with the patient/caregiver today.   ROS  Ten systems reviewed and is negative except as mentioned  in HPI  Objective  Vitals:   01/24/21 1215  BP: 136/84  Pulse: 96  Resp: 16  Temp: 97.9 F (36.6 C)  TempSrc: Oral  SpO2: 99%  Weight: 255 lb (115.7 kg)  Height: 5\' 4"  (1.626 m)    Body mass index is 43.77 kg/m.  Physical Exam  Constitutional: Patient appears well-developed and well-nourished. Obese  No distress.  HEENT: head atraumatic, normocephalic, pupils equal and reactive to light, neck supple, fever blister on top right lip Cardiovascular: Normal rate, regular rhythm and normal heart sounds.  No murmur heard. No BLE edema. Pulmonary/Chest: Effort normal and breath sounds normal. No respiratory distress. Abdominal: Soft.  There is no tenderness. Neurological:  normal exam  Psychiatric: Patient has a normal mood and affect. behavior is normal. Judgment and thought content normal.  PHQ2/9: Depression screen [facility] 2/9 01/24/2021 12/04/2020 11/19/2020 10/16/2020 04/18/2019  Decreased Interest 2 0 0 1 1  Down, Depressed, Hopeless 3 0 2 2 1   PHQ - 2 Score 5 0 2 3 2   Altered sleeping 3 1 1 3 1   Tired, decreased energy 3 1 0 3 1  Change in appetite 0 0 0 3 1  Feeling bad or failure about yourself  0 0 0 2 0  Trouble concentrating 0 0 0 2 0  Moving slowly or fidgety/restless 0 0 0 0 0  Suicidal thoughts 0 0 0 0 0  PHQ-9 Score 11 2 3 16 5   Difficult doing work/chores - Not difficult at all - Very difficult Somewhat difficult  Some recent data might be hidden    phq 9 is positive   Fall Risk: Fall Risk  01/24/2021 12/04/2020 11/19/2020 10/16/2020 04/18/2019  Falls in the past year? 0 0 0 0 0  Number falls in past yr: 0 0 0 0 0  Injury with Fall? 0 0 0 0 0  Follow up - - - - -    Functional Status Survey: Is the patient deaf or have difficulty hearing?: No Does the patient have difficulty seeing, even when wearing glasses/contacts?: No Does the patient have difficulty concentrating, remembering, or making decisions?: No Does the patient have difficulty walking or climbing stairs?: No Does the patient have difficulty dressing or bathing?: No Does the patient have difficulty doing errands alone such as visiting a doctor's office or shopping?: No    Assessment & Plan  1. Intractable migraine with aura with status migrainosus  - Fremanezumab-vfrm SOSY 1 each - Ubrogepant (UBRELVY) 100 MG TABS; Take 1 each by mouth daily as needed.  Dispense: 16 tablet; Refill: 0 - predniSONE (DELTASONE) 10 MG tablet; Take 1 tablet (10 mg total) by mouth 2 (two) times daily with a meal.  Dispense: 6 tablet; Refill: 0  2. Nausea   3. Fever blister  - valACYclovir (VALTREX) 1000 MG tablet; Take 1 tablet (1,000 mg total) by mouth 2 (two) times daily.  Dispense: 10  tablet; Refill: 0

## 2021-01-24 ENCOUNTER — Encounter: Payer: Self-pay | Admitting: Family Medicine

## 2021-01-24 ENCOUNTER — Other Ambulatory Visit: Payer: Self-pay

## 2021-01-24 ENCOUNTER — Ambulatory Visit: Payer: BC Managed Care – PPO | Admitting: Family Medicine

## 2021-01-24 VITALS — BP 136/84 | HR 96 | Temp 97.9°F | Resp 16 | Ht 64.0 in | Wt 255.0 lb

## 2021-01-24 DIAGNOSIS — G43111 Migraine with aura, intractable, with status migrainosus: Secondary | ICD-10-CM

## 2021-01-24 DIAGNOSIS — B001 Herpesviral vesicular dermatitis: Secondary | ICD-10-CM | POA: Diagnosis not present

## 2021-01-24 DIAGNOSIS — R110 Nausea: Secondary | ICD-10-CM

## 2021-01-24 MED ORDER — FREMANEZUMAB-VFRM 225 MG/1.5ML ~~LOC~~ SOSY
1.0000 | PREFILLED_SYRINGE | Freq: Once | SUBCUTANEOUS | Status: AC
  Administered 2021-01-24: 1 via SUBCUTANEOUS

## 2021-01-24 MED ORDER — VALACYCLOVIR HCL 1 G PO TABS: 1000.0000 mg | ORAL_TABLET | Freq: Two times a day (BID) | ORAL | 0 refills | Status: DC

## 2021-01-24 MED ORDER — PREDNISONE 10 MG PO TABS: 10.0000 mg | ORAL_TABLET | Freq: Two times a day (BID) | ORAL | 0 refills | Status: DC

## 2021-01-24 MED ORDER — UBRELVY 100 MG PO TABS: 1.0000 | ORAL_TABLET | Freq: Every day | ORAL | 0 refills | Status: DC | PRN

## 2021-04-18 ENCOUNTER — Ambulatory Visit: Payer: Self-pay | Admitting: *Deleted

## 2021-04-18 NOTE — Telephone Encounter (Signed)
Called pt to inform her we do not have any openings for this week or next and would call if we get a cancellation. Pt states she will try over the counter meds for now

## 2021-04-18 NOTE — Telephone Encounter (Signed)
Pt called due to abdominal pain that has ongoing for "years"; she says it has increased over the past few weeks starting May 2022; her pain is located under her diaphragm/stomach regardless of eating; she also has nausea even if she eats bland food; she describes her discomfort as burning and cramping rated 5 out of 10; she can eat plain rice occasionally decreases the pain; she has taken OTC pepcid, pepto, and gas-x; recommendations made per nurse triage protocol; she verbalized understanding; the pt is seen by Dr Ancil Boozer, [facility] but there is no availability within timeframe per guidelines; pt declined [facility] offered  and would prefer to be evaluated in the office; she can be contacted at [phone]; will route to office for pt contact and scheduling. Reason for Disposition . [1] MODERATE pain (e.g., interferes with normal activities) AND [2] pain comes and goes (cramps) AND [3] present > 24 hours  (Exception: pain with Vomiting or Diarrhea - see that Guideline)  Answer Assessment - Initial Assessment Questions 1. LOCATION: "Where does it hurt?"      Diaphragm/upper stomach 2. RADIATION: "Does the pain shoot anywhere else?" (e.g., chest, back)     no 3. ONSET: "When did the pain begin?" (e.g., minutes, hours or days ago)      Worsened since Mar 18, 2021; ongoing for years 4. SUDDEN: "Gradual or sudden onset?"    gradual 5. PATTERN "Does the pain come and go, or is it constant?"    - If constant: "Is it getting better, staying the same, or worsening?"      (Note: Constant means the pain never goes away completely; most serious pain is constant and it progresses)     - If intermittent: "How long does it last?" "Do you have pain now?"     (Note: Intermittent means the pain goes away completely between bouts)    constant 6. SEVERITY: "How bad is the pain?"  (e.g., Scale 1-10; mild, moderate, or severe)   - MILD (1-3): doesn't interfere with normal activities, abdomen soft  and not tender to touch    - MODERATE (4-7): interferes with normal activities or awakens from sleep, abdomen tender to touch    - SEVERE (8-10): excruciating pain, doubled over, unable to do any normal activities      5 out of 10 7. RECURRENT SYMPTOM: "Have you ever had this type of stomach pain before?" If Yes, ask: "When was the last time?" and "What happened that time?"    Ongoing for years;previously diagnosed with IBS 8. CAUSE: "What do you think is causing the stomach pain?"     Not sure 9. RELIEVING/AGGRAVATING FACTORS: "What makes it better or worse?" (e.g., movement, antacids, bowel movement)    Eating rice helps occassionally 10. OTHER SYMPTOMS: "Do you have any other symptoms?" (e.g., back pain, diarrhea, fever, urination pain, vomiting)       Nausea, burning, intermittent sharp shooting pain 11. PREGNANCY: "Is there any chance you are pregnant?" "When was your last menstrual period?"     No BTL, LMP 04/07/21  Protocols used: ABDOMINAL PAIN - [facility]

## 2021-04-19 NOTE — Telephone Encounter (Signed)
Called lft vm we can get in Monday with cheryl

## 2021-04-22 ENCOUNTER — Other Ambulatory Visit: Payer: Self-pay

## 2021-04-22 ENCOUNTER — Ambulatory Visit: Payer: BC Managed Care – PPO | Admitting: Unknown Physician Specialty

## 2021-04-22 ENCOUNTER — Encounter: Payer: Self-pay | Admitting: Unknown Physician Specialty

## 2021-04-22 VITALS — BP 136/82 | Temp 98.1°F | Resp 16 | Ht 64.0 in | Wt 254.0 lb

## 2021-04-22 DIAGNOSIS — F411 Generalized anxiety disorder: Secondary | ICD-10-CM | POA: Diagnosis not present

## 2021-04-22 DIAGNOSIS — K2900 Acute gastritis without bleeding: Secondary | ICD-10-CM

## 2021-04-22 MED ORDER — CLONAZEPAM 0.5 MG PO TABS: 0.5000 mg | ORAL_TABLET | Freq: Two times a day (BID) | ORAL | 0 refills | Status: DC | PRN

## 2021-04-22 MED ORDER — PANTOPRAZOLE SODIUM 40 MG PO TBEC: 40.0000 mg | DELAYED_RELEASE_TABLET | Freq: Every day | ORAL | 3 refills | Status: DC

## 2021-04-22 NOTE — Progress Notes (Signed)
BP 136/82   Temp 98.1 F (36.7 C) (Oral)   Resp 16   Ht 5\' 4"  (1.626 m)   Wt 254 lb (115.2 kg)   SpO2 99%   BMI 43.60 kg/m    Subjective:    Patient ID: Sally Mueller, female    DOB: Dec 01, 1975, 45 y.o.   MRN: [id number]  HPI: Sally Mueller is a 45 y.o. female  Chief Complaint  Patient presents with  . Abdominal Pain   Abdominal Pain This is a new problem. Episode onset: Always had stomach issues such as IBS but worse in the last several months and expecially these last 2 weeks.  Admits to a lot of stress on the job. The onset quality is gradual. The problem occurs constantly. The problem has been gradually worsening. The pain is located in the epigastric region. The quality of the pain is cramping. The abdominal pain does not radiate. Associated symptoms include anorexia, belching, headaches, nausea and vomiting. Pertinent negatives include no arthralgias, constipation, diarrhea, dysuria, fever, flatus, frequency, hematochezia, hematuria, melena, myalgias or weight loss. The pain is aggravated by eating (Not eating as well as eating). Relieved by: temporarily by alka selzer and not by Pepto. She has tried antacids for the symptoms. The treatment provided no relief. Her past medical history is significant for irritable bowel syndrome.   Anxiety Now going to therapy.  Gets Klonopin #10 every 3 months..  Would like a refill  Relevant past medical, surgical, family and social history reviewed and updated as indicated. Interim medical history since our last visit reviewed. Allergies and medications reviewed and updated.  Review of Systems  Constitutional: Negative for fever and weight loss.  Gastrointestinal: Positive for abdominal pain, anorexia, nausea and vomiting. Negative for constipation, diarrhea, flatus, hematochezia and melena.  Genitourinary: Negative for dysuria, frequency and hematuria.  Musculoskeletal: Negative for arthralgias and myalgias.  Neurological: Positive for  headaches.    Per HPI unless specifically indicated above     Objective:    BP 136/82   Temp 98.1 F (36.7 C) (Oral)   Resp 16   Ht 5\' 4"  (1.626 m)   Wt 254 lb (115.2 kg)   SpO2 99%   BMI 43.60 kg/m   Wt Readings from Last 3 Encounters:  04/22/21 254 lb (115.2 kg)  01/24/21 255 lb (115.7 kg)  12/04/20 252 lb (114.3 kg)    Physical Exam Constitutional:      General: She is not in acute distress.    Appearance: Normal appearance. She is well-developed.  HENT:     Head: Normocephalic and atraumatic.  Eyes:     General: Lids are normal. No scleral icterus.       Right eye: No discharge.        Left eye: No discharge.     Conjunctiva/sclera: Conjunctivae normal.  Neck:     Vascular: No carotid bruit or JVD.  Cardiovascular:     Rate and Rhythm: Normal rate and regular rhythm.     Heart sounds: Normal heart sounds.  Pulmonary:     Effort: Pulmonary effort is normal.     Breath sounds: Normal breath sounds.  Abdominal:     Palpations: There is no hepatomegaly or splenomegaly.     Tenderness: There is abdominal tenderness in the epigastric area.  Musculoskeletal:        General: Normal range of motion.     Cervical back: Normal range of motion and neck supple.  Skin:  General: Skin is warm and dry.     Coloration: Skin is not pale.     Findings: No rash.  Neurological:     Mental Status: She is alert and oriented to person, place, and time.  Psychiatric:        Behavior: Behavior normal.        Thought Content: Thought content normal.        Judgment: Judgment normal.      Assessment & Plan:   Problem List Items Addressed This Visit      Unprioritized   Anxiety, generalized    Congrats for getting therapy.  Refill the Clonazepam.  Last refill over 6 months ago      Relevant Medications   clonazePAM (KLONOPIN) 0.5 MG tablet    Other Visit Diagnoses    Acute gastritis without hemorrhage, unspecified gastritis type    -  Primary   Relevant Orders   H  Pylori, IGM, IGG, IGA AB   CBC with Differential/Platelet       Follow up plan: Return in about 2 weeks (around 05/06/2021).

## 2021-04-22 NOTE — Addendum Note (Signed)
Addended by: Docia Furl on: 04/22/2021 01:51 PM   Modules accepted: Orders

## 2021-04-22 NOTE — Assessment & Plan Note (Signed)
Congrats for getting therapy.  Refill the Clonazepam.  Last refill over 6 months ago

## 2021-04-22 NOTE — Patient Instructions (Signed)
Gastritis, Adult Gastritis is inflammation of the stomach. There are two kinds of gastritis:  Acute gastritis. This kind develops suddenly.  Chronic gastritis. This kind is much more common and lasts for a long time. Gastritis happens when the lining of the stomach becomes weak or gets damaged. Without treatment, gastritis can lead to stomach bleeding and ulcers. What are the causes? This condition may be caused by:  An infection.  Drinking too much alcohol.  Certain medicines. These include steroids, antibiotics, and some over-the-counter medicines, such as aspirin or ibuprofen.  Having too much acid in the stomach.  A disease of the intestines or stomach.  Stress.  An allergic reaction.  Crohn's disease.  Some cancer treatments (radiation). Sometimes the cause of this condition is not known. What are the signs or symptoms? Symptoms of this condition include:  Pain or a burning sensation in the upper abdomen.  Nausea.  Vomiting.  An uncomfortable feeling of fullness after eating.  Weight loss.  Bad breath.  Blood in your vomit or stools. In some cases, there are no symptoms. How is this diagnosed? This condition may be diagnosed with:  Your medical history and a description of your symptoms.  A physical exam.  Tests. These can include: ? Blood tests. ? Stool tests. ? A test in which a thin, flexible instrument with a light and a camera is passed down the esophagus and into the stomach (upper endoscopy). ? A test in which a sample of tissue is taken for testing (biopsy). How is this treated? This condition may be treated with medicines. The medicines that are used vary depending on the cause of the gastritis:  If the condition is caused by a bacterial infection, you may be given antibiotic medicines.  If the condition is caused by too much acid in the stomach, you may be given medicines called H2 blockers, proton pump inhibitors, or antacids. Treatment  may also involve stopping the use of certain medicines, such as aspirin, ibuprofen, or other NSAIDs. Follow these instructions at home: Medicines  Take over-the-counter and prescription medicines only as told by your health care provider.  If you were prescribed an antibiotic medicine, take it as told by your health care provider. Do not stop taking the antibiotic even if you start to feel better. Eating and drinking  Eat small, frequent meals instead of large meals.  Avoid foods and drinks that make your symptoms worse.  Drink enough fluid to keep your urine pale yellow.   Alcohol use  Do not drink alcohol if: ? Your health care provider tells you not to drink. ? You are pregnant, may be pregnant, or are planning to become pregnant.  If you drink alcohol: ? Limit your use to:  0-1 drink a day for women.  0-2 drinks a day for men. ? Be aware of how much alcohol is in your drink. In the [place] one drink equals one 12 oz bottle of beer (355 mL), one 5 oz glass of wine (148 mL), or one 1 oz glass of hard liquor (44 mL). General instructions  Talk with your health care provider about ways to manage stress, such as getting regular exercise or practicing deep breathing, meditation, or yoga.  Do not use any products that contain nicotine or tobacco, such as cigarettes and e-cigarettes. If you need help quitting, ask your health care provider.  Keep all follow-up visits as told by your health care provider. This is important. Contact a health care provider if:  Your symptoms get worse.  Your symptoms return after treatment. Get help right away if:  You vomit blood or material that looks like coffee grounds.  You have black or dark red stools.  You are unable to keep fluids down.  Your abdominal pain gets worse.  You have a fever.  You do not feel better after one week. Summary  Gastritis is inflammation of the lining of the stomach that can occur suddenly (acute) or  develop slowly over time (chronic).  This condition is diagnosed with a medical history, a physical exam, or tests.  This condition may be treated with medicines to treat infection or medicines to reduce the amount of acid in your stomach.  Follow your health care provider's instructions about taking medicines, making changes to your diet, and knowing when to call for help. This information is not intended to replace advice given to you by your health care provider. Make sure you discuss any questions you have with your health care provider. Document Revised: 03/23/2018 Document Reviewed: 03/23/2018 Elsevier Patient Education  [place].

## 2021-04-25 ENCOUNTER — Other Ambulatory Visit: Payer: Self-pay | Admitting: Unknown Physician Specialty

## 2021-04-25 DIAGNOSIS — K580 Irritable bowel syndrome with diarrhea: Secondary | ICD-10-CM

## 2021-04-26 LAB — CBC WITH DIFFERENTIAL/PLATELET
Absolute Monocytes: 648 cells/uL (ref 200–950)
Basophils Absolute: 65 cells/uL (ref 0–200)
Basophils Relative: 0.6 %
Eosinophils Absolute: 108 cells/uL (ref 15–500)
Eosinophils Relative: 1 %
HCT: 36.4 % (ref 35.0–45.0)
Hemoglobin: 11.1 g/dL — ABNORMAL LOW (ref 11.7–15.5)
Lymphs Abs: 2754 cells/uL (ref 850–3900)
MCH: 24 pg — ABNORMAL LOW (ref 27.0–33.0)
MCHC: 30.5 g/dL — ABNORMAL LOW (ref 32.0–36.0)
MCV: 78.6 fL — ABNORMAL LOW (ref 80.0–100.0)
MPV: 12.1 fL (ref 7.5–12.5)
Monocytes Relative: 6 %
Neutro Abs: 7225 cells/uL (ref 1500–7800)
Neutrophils Relative %: 66.9 %
Platelets: 305 10*3/uL (ref 140–400)
RBC: 4.63 10*6/uL (ref 3.80–5.10)
RDW: 14.4 % (ref 11.0–15.0)
Total Lymphocyte: 25.5 %
WBC: 10.8 10*3/uL (ref 3.8–10.8)

## 2021-04-26 LAB — TEST AUTHORIZATION: TEST CODE:: 14839

## 2021-04-26 LAB — H. PYLORI BREATH TEST: H. pylori Breath Test: NOT DETECTED

## 2021-05-02 ENCOUNTER — Other Ambulatory Visit: Payer: Self-pay | Admitting: Unknown Physician Specialty

## 2021-05-02 DIAGNOSIS — D649 Anemia, unspecified: Secondary | ICD-10-CM

## 2021-05-02 NOTE — Progress Notes (Signed)
Ordered anemia panel

## 2021-05-03 ENCOUNTER — Other Ambulatory Visit: Payer: Self-pay

## 2021-05-03 DIAGNOSIS — D649 Anemia, unspecified: Secondary | ICD-10-CM

## 2021-05-10 ENCOUNTER — Other Ambulatory Visit: Payer: Self-pay

## 2021-05-10 ENCOUNTER — Emergency Department
Admission: EM | Admit: 2021-05-10 | Discharge: 2021-05-10 | Disposition: A | Discharge status: Home / Self Care | Payer: BC Managed Care – PPO | Attending: Emergency Medicine | Admitting: Emergency Medicine

## 2021-05-10 DIAGNOSIS — Z5321 Procedure and treatment not carried out due to patient leaving prior to being seen by health care provider: Secondary | ICD-10-CM | POA: Insufficient documentation

## 2021-05-10 DIAGNOSIS — M545 Low back pain, unspecified: Secondary | ICD-10-CM | POA: Insufficient documentation

## 2021-05-10 LAB — URINALYSIS, COMPLETE (UACMP) WITH MICROSCOPIC
Bilirubin Urine: NEGATIVE
Glucose, UA: NEGATIVE mg/dL
Ketones, ur: NEGATIVE mg/dL
Leukocytes,Ua: NEGATIVE
Nitrite: NEGATIVE
Protein, ur: NEGATIVE mg/dL
RBC / HPF: >50 RBC/hpf — ABNORMAL HIGH (ref 0–5)
Specific Gravity, Urine: 1.024 (ref 1.005–1.030)
pH: 5 (ref 5.0–8.0)

## 2021-05-10 NOTE — ED Triage Notes (Addendum)
Pt here with left lower back pain that started about an hour ago. Pt states pain started in her pelvis and moved around to the back.

## 2021-05-10 NOTE — ED Notes (Signed)
Called Pt. 3 times and no response. Outside or inside lobby

## 2021-11-04 ENCOUNTER — Other Ambulatory Visit: Payer: Self-pay | Admitting: Family Medicine

## 2021-11-04 DIAGNOSIS — B001 Herpesviral vesicular dermatitis: Secondary | ICD-10-CM

## 2021-11-04 MED ORDER — VALACYCLOVIR HCL 1 G PO TABS: 1000.0000 mg | ORAL_TABLET | Freq: Two times a day (BID) | ORAL | 0 refills | Status: DC

## 2021-12-24 ENCOUNTER — Encounter: Payer: Self-pay | Admitting: Family Medicine

## 2021-12-25 ENCOUNTER — Other Ambulatory Visit: Payer: Self-pay

## 2021-12-25 ENCOUNTER — Encounter: Payer: Self-pay | Admitting: Nurse Practitioner

## 2021-12-25 ENCOUNTER — Telehealth (INDEPENDENT_AMBULATORY_CARE_PROVIDER_SITE_OTHER): Payer: BC Managed Care – PPO | Admitting: Nurse Practitioner

## 2021-12-25 DIAGNOSIS — J069 Acute upper respiratory infection, unspecified: Secondary | ICD-10-CM

## 2021-12-25 MED ORDER — BENZONATATE 100 MG PO CAPS: 200.0000 mg | ORAL_CAPSULE | Freq: Two times a day (BID) | ORAL | 0 refills | Status: DC | PRN

## 2021-12-25 NOTE — Progress Notes (Signed)
Name: Sally Mueller   MRN: [id number]    DOB: 10-05-1976   Date:12/25/2021       Progress Note  Subjective  Chief Complaint  Chief Complaint  Patient presents with   Cough    Chest congestion, wheezing, cough, headache, fever, Covid test negative    I connected with  Sally Mueller  on 12/25/21 at  1:00 PM EST by a video enabled telemedicine application and verified that I am speaking with the correct person using two identifiers.  I discussed the limitations of evaluation and management by telemedicine and the availability of in person appointments. The patient expressed understanding and agreed to proceed with a virtual visit  Staff also discussed with the patient that there may be a patient responsible charge related to this service. Patient Location: home Provider Location: [facility] Additional Individuals present: alone  HPI  URI: She says her symptoms include cough, chest congestion, headache, and fever. She says she did an at home covid test and it was negative. She says she also did a covid pcr test at work but does not have the results yet she says she should get them back by tomorrow.  She says symptoms started on Sunday.  She denies any shortness of breath. She says that she has been taking OTC treatments and is feeling much better today.  She said she had been wheezing but does not feel like it is bad. Declined flu testing.  Will send in prescription for tessalon perls.  Keep pushing fluids and get rest.  She is interested in covid anti-viral treatment if covid test comes back positive. Discussed side effects.    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   Posterior neck pain 08/30/2015   IBS (irritable bowel syndrome) 07/12/2015   Anxiety, generalized 05/17/2015   Headache disorder 05/17/2015   Dysmenorrhea 46/56/8127   Dysmetabolic  syndrome 51/70/0174   Migraine with aura and without status migrainosus 05/17/2015   Morbid obesity ([place]) 05/17/2015    Social History   Tobacco Use   Smoking status: Former    Years: 8.00    Types: Cigarettes    Start date: 11/17/1993    Quit date: 11/17/2001    Years since quitting: 20.1   Smokeless tobacco: Never  Substance Use Topics   Alcohol use: No    Alcohol/week: 0.0 standard drinks     Current Outpatient Medications:    almotriptan (AXERT) 12.5 MG tablet, may repeat in 2 hours if needed, Disp: 9 tablet, Rfl: 1   atenolol (TENORMIN) 25 MG tablet, Take 1 tablet (25 mg total) by mouth daily., Disp: 90 tablet, Rfl: 0   Cholecalciferol (VITAMIN D3) 25 MCG (1000 UT) CAPS, Take 1 capsule (1,000 Units total) by mouth daily., Disp: 30 capsule, Rfl: 0   clonazePAM (KLONOPIN) 0.5 MG tablet, Take 1 tablet (0.5 mg total) by mouth 2 (two) times daily as needed for anxiety., Disp: 10 tablet, Rfl: 0   hydrOXYzine (ATARAX/VISTARIL) 10 MG tablet, Take 1 tablet (10 mg total) by mouth at bedtime as needed., Disp: 90 tablet, Rfl: 1   pantoprazole (PROTONIX) 40 MG tablet, Take 1 tablet (40 mg total) by mouth daily., Disp: 30 tablet, Rfl: 3   promethazine (PHENERGAN) 25 MG tablet, Take 1 tablet (25 mg total) by mouth every 8 (eight) hours as needed., Disp: 30 tablet, Rfl: 0  sertraline (ZOLOFT) 50 MG tablet, Take by mouth., Disp: , Rfl:    Ubrogepant (UBRELVY) 100 MG TABS, Take 1 each by mouth daily as needed., Disp: 16 tablet, Rfl: 0   valACYclovir (VALTREX) 1000 MG tablet, Take 1 tablet (1,000 mg total) by mouth 2 (two) times daily., Disp: 10 tablet, Rfl: 0  Allergies  Allergen Reactions   Sumatriptan     crawling sensation in her body, flushed    I personally reviewed active problem list, medication list, allergies, notes from last encounter with the patient/caregiver today.  ROS  Constitutional: Positive for fever , negative for weight change.  Respiratory: Positive for cough and  negative for shortness of breath.   Cardiovascular: Negative for chest pain or palpitations.  Gastrointestinal: Negative for abdominal pain, no bowel changes.  Musculoskeletal: Negative for gait problem or joint swelling.  Skin: Negative for rash.  Neurological: Negative for dizziness, positive for headache.  No other specific complaints in a complete review of systems (except as listed in HPI above).   Objective  Virtual encounter, vitals not obtained.  There is no height or weight on file to calculate BMI.  Nursing Note and Vital Signs reviewed.  Physical Exam  Awake, alert and oriented, speaking in complete sentences  No results found for this or any previous visit (from the past 72 hour(s)).  Assessment & Plan  1. Viral upper respiratory tract infection -push fluids and get rest -update if covid test comes back positive -continue OTC treatments for symptoms - benzonatate (TESSALON) 100 MG capsule; Take 2 capsules (200 mg total) by mouth 2 (two) times daily as needed for cough.  Dispense: 20 capsule; Refill: 0    -Red flags and when to present for emergency care or RTC including fever >101.43F, chest pain, shortness of breath, new/worsening/un-resolving symptoms,  reviewed with patient at time of visit. Follow up and care instructions discussed and provided in AVS. - I discussed the assessment and treatment plan with the patient. The patient was provided an opportunity to ask questions and all were answered. The patient agreed with the plan and demonstrated an understanding of the instructions.  I provided 15 minutes of non-face-to-face time during this encounter.  Bo Merino, FNP

## 2022-01-27 NOTE — Progress Notes (Unsigned)
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   LIPOMA EXCISION  05/01/2014   TUBAL LIGATION  2002   TUMOR REMOVAL  2012?    Family History  Problem Relation Age of Onset   Cardiomyopathy Mother    Hypertension Mother    Drug abuse Mother    Hyperlipidemia Father    Alcohol abuse Sister    Drug abuse Sister    Bipolar disorder Sister    Schizophrenia Sister    Breast cancer Neg Hx     Social History   Tobacco Use   Smoking status: Former    Years: 8.00    Types: Cigarettes    Start date: 11/17/1993    Quit date: 11/17/2001    Years since quitting: 20.2   Smokeless tobacco: Never  Substance Use Topics   Alcohol use: No    Alcohol/week: 0.0 standard drinks     Current Outpatient Medications:    almotriptan (AXERT) 12.5 MG tablet, may repeat in 2 hours if needed, Disp: 9 tablet, Rfl: 1   atenolol (TENORMIN) 25 MG tablet, Take 1 tablet (25 mg total) by mouth daily., Disp: 90 tablet, Rfl: 0   benzonatate (TESSALON) 100 MG capsule, Take 2 capsules (200 mg total) by mouth 2 (two) times daily as needed for cough., Disp: 20 capsule, Rfl: 0   Cholecalciferol (VITAMIN D3) 25 MCG (1000 UT) CAPS, Take 1 capsule (1,000 Units total) by mouth daily., Disp: 30 capsule, Rfl:  0   clonazePAM (KLONOPIN) 0.5 MG tablet, Take 1 tablet (0.5 mg total) by mouth 2 (two) times daily as needed for anxiety., Disp: 10 tablet, Rfl: 0   hydrOXYzine (ATARAX/VISTARIL) 10 MG tablet, Take 1 tablet (10 mg total) by mouth at bedtime as needed., Disp: 90 tablet, Rfl: 1   pantoprazole (PROTONIX) 40 MG tablet, Take 1 tablet (40 mg total) by mouth daily., Disp: 30 tablet, Rfl: 3   promethazine (PHENERGAN) 25 MG tablet, Take 1 tablet (25 mg total) by mouth every 8 (eight) hours as needed., Disp: 30 tablet, Rfl: 0   sertraline (ZOLOFT) 50 MG tablet, Take by mouth., Disp: , Rfl:    Ubrogepant (UBRELVY) 100 MG TABS, Take 1 each by mouth daily as needed., Disp: 16 tablet, Rfl: 0   valACYclovir (VALTREX) 1000 MG tablet, Take 1 tablet (1,000 mg total) by mouth 2 (two) times daily., Disp: 10 tablet, Rfl: 0  Allergies  Allergen Reactions   Sumatriptan     crawling sensation in her body, flushed    I personally reviewed active problem list, medication list, allergies, family history, social history, health maintenance with the patient/caregiver today.   ROS  ***  Objective  There were no vitals filed for this visit.  There is no  height or weight on file to calculate BMI.  Physical Exam ***  No results found for this or any previous visit (from the past 2160 hour(s)).  PHQ2/9: Depression screen [facility] 2/9 12/25/2021 04/22/2021 01/24/2021 12/04/2020 11/19/2020  Decreased Interest 0 0 2 0 0  Down, Depressed, Hopeless 0 0 3 0 2  PHQ - 2 Score 0 0 5 0 2  Altered sleeping - 0 '3 1 1  '$ Tired, decreased energy - 0 3 1 0  Change in appetite - 0 0 0 0  Feeling bad or failure about yourself  - 0 0 0 0  Trouble concentrating - 0 0 0 0  Moving slowly or fidgety/restless - 0 0 0 0  Suicidal thoughts - 0 0 0 0  PHQ-9 Score - 0 '11 2 3  '$ Difficult doing work/chores - Not difficult at all - Not difficult at all -  Some recent data might be hidden    phq 9 is {gen pos [id number]}   Fall Risk: Fall Risk   12/25/2021 04/22/2021 01/24/2021 12/04/2020 11/19/2020  Falls in the past year? 0 0 0 0 0  Number falls in past yr: 0 0 0 0 0  Injury with Fall? 0 0 0 0 0  Follow up Falls evaluation completed Falls evaluation completed - - -      Functional Status Survey:      Assessment & Plan  *** There are no diagnoses linked to this encounter.

## 2022-01-28 ENCOUNTER — Ambulatory Visit: Payer: BC Managed Care – PPO | Admitting: Family Medicine

## 2022-01-28 ENCOUNTER — Encounter: Payer: Self-pay | Admitting: Family Medicine

## 2022-01-28 ENCOUNTER — Telehealth: Payer: Self-pay

## 2022-01-28 VITALS — BP 138/82 | HR 94 | Resp 16 | Ht 63.0 in | Wt 251.0 lb

## 2022-01-28 DIAGNOSIS — Z1211 Encounter for screening for malignant neoplasm of colon: Secondary | ICD-10-CM

## 2022-01-28 DIAGNOSIS — G43109 Migraine with aura, not intractable, without status migrainosus: Secondary | ICD-10-CM

## 2022-01-28 DIAGNOSIS — R140 Abdominal distension (gaseous): Secondary | ICD-10-CM

## 2022-01-28 DIAGNOSIS — Z79899 Other long term (current) drug therapy: Secondary | ICD-10-CM

## 2022-01-28 DIAGNOSIS — E538 Deficiency of other specified B group vitamins: Secondary | ICD-10-CM

## 2022-01-28 DIAGNOSIS — F321 Major depressive disorder, single episode, moderate: Secondary | ICD-10-CM

## 2022-01-28 DIAGNOSIS — B001 Herpesviral vesicular dermatitis: Secondary | ICD-10-CM

## 2022-01-28 DIAGNOSIS — Z131 Encounter for screening for diabetes mellitus: Secondary | ICD-10-CM

## 2022-01-28 DIAGNOSIS — D649 Anemia, unspecified: Secondary | ICD-10-CM

## 2022-01-28 DIAGNOSIS — E559 Vitamin D deficiency, unspecified: Secondary | ICD-10-CM

## 2022-01-28 DIAGNOSIS — F411 Generalized anxiety disorder: Secondary | ICD-10-CM

## 2022-01-28 DIAGNOSIS — Z13220 Encounter for screening for lipoid disorders: Secondary | ICD-10-CM

## 2022-01-28 DIAGNOSIS — R1010 Upper abdominal pain, unspecified: Secondary | ICD-10-CM

## 2022-01-28 MED ORDER — VALACYCLOVIR HCL 500 MG PO TABS: 500.0000 mg | ORAL_TABLET | Freq: Every day | ORAL | 0 refills | Status: DC

## 2022-01-28 MED ORDER — PANTOPRAZOLE SODIUM 40 MG PO TBEC: 40.0000 mg | DELAYED_RELEASE_TABLET | Freq: Every day | ORAL | 0 refills | Status: DC

## 2022-01-28 MED ORDER — UBRELVY 100 MG PO TABS: 1.0000 | ORAL_TABLET | Freq: Every day | ORAL | 0 refills | Status: DC | PRN

## 2022-01-28 MED ORDER — PROMETHAZINE HCL 25 MG PO TABS: 25.0000 mg | ORAL_TABLET | Freq: Three times a day (TID) | ORAL | 0 refills | Status: DC | PRN

## 2022-01-28 NOTE — Telephone Encounter (Signed)
Scheduled for 05/06/2022 ?

## 2022-01-29 LAB — COMPLETE METABOLIC PANEL WITH GFR
AG Ratio: 1.3 (calc) (ref 1.0–2.5)
ALT: 14 U/L (ref 6–29)
AST: 18 U/L (ref 10–35)
Albumin: 4 g/dL (ref 3.6–5.1)
Alkaline phosphatase (APISO): 77 U/L (ref 31–125)
BUN: 14 mg/dL (ref 7–25)
CO2: 28 mmol/L (ref 20–32)
Calcium: 9.2 mg/dL (ref 8.6–10.2)
Chloride: 106 mmol/L (ref 98–110)
Creat: 0.66 mg/dL (ref 0.50–0.99)
Globulin: 3 g/dL (calc) (ref 1.9–3.7)
Glucose, Bld: 89 mg/dL (ref 65–99)
Potassium: 4.2 mmol/L (ref 3.5–5.3)
Sodium: 142 mmol/L (ref 135–146)
Total Bilirubin: 0.6 mg/dL (ref 0.2–1.2)
Total Protein: 7 g/dL (ref 6.1–8.1)
eGFR: 110 mL/min/{1.73_m2} (ref >=60)

## 2022-01-29 LAB — HEMOGLOBIN A1C
Hgb A1c MFr Bld: 5.4 % of total Hgb (ref <5.7)
Mean Plasma Glucose: 108 mg/dL
eAG (mmol/L): 6 mmol/L

## 2022-01-29 LAB — CBC WITH DIFFERENTIAL/PLATELET
Absolute Monocytes: 554 cells/uL (ref 200–950)
Basophils Absolute: 50 cells/uL (ref 0–200)
Basophils Relative: 0.7 %
Eosinophils Absolute: 72 cells/uL (ref 15–500)
Eosinophils Relative: 1 %
HCT: 34.9 % — ABNORMAL LOW (ref 35.0–45.0)
Hemoglobin: 10.8 g/dL — ABNORMAL LOW (ref 11.7–15.5)
Lymphs Abs: 1958 cells/uL (ref 850–3900)
MCH: 23.6 pg — ABNORMAL LOW (ref 27.0–33.0)
MCHC: 30.9 g/dL — ABNORMAL LOW (ref 32.0–36.0)
MCV: 76.2 fL — ABNORMAL LOW (ref 80.0–100.0)
MPV: 12.1 fL (ref 7.5–12.5)
Monocytes Relative: 7.7 %
Neutro Abs: 4565 cells/uL (ref 1500–7800)
Neutrophils Relative %: 63.4 %
Platelets: 325 10*3/uL (ref 140–400)
RBC: 4.58 10*6/uL (ref 3.80–5.10)
RDW: 14.8 % (ref 11.0–15.0)
Total Lymphocyte: 27.2 %
WBC: 7.2 10*3/uL (ref 3.8–10.8)

## 2022-01-29 LAB — IRON,TIBC AND FERRITIN PANEL
%SAT: 6 % (calc) — ABNORMAL LOW (ref 16–45)
Ferritin: 3 ng/mL — ABNORMAL LOW (ref 16–232)
Iron: 27 ug/dL — ABNORMAL LOW (ref 40–190)
TIBC: 442 mcg/dL (calc) (ref 250–450)

## 2022-01-29 LAB — VITAMIN D 25 HYDROXY (VIT D DEFICIENCY, FRACTURES): Vit D, 25-Hydroxy: 16 ng/mL — ABNORMAL LOW (ref 30–100)

## 2022-01-29 LAB — LIPID PANEL
Cholesterol: 180 mg/dL (ref <200)
HDL: 43 mg/dL — ABNORMAL LOW (ref >=50)
LDL Cholesterol (Calc): 107 mg/dL (calc) — ABNORMAL HIGH
Non-HDL Cholesterol (Calc): 137 mg/dL (calc) — ABNORMAL HIGH (ref <130)
Total CHOL/HDL Ratio: 4.2 (calc) (ref <5.0)
Triglycerides: 179 mg/dL — ABNORMAL HIGH (ref <150)

## 2022-01-29 LAB — VITAMIN B12: Vitamin B-12: 317 pg/mL (ref 200–1100)

## 2022-02-05 ENCOUNTER — Encounter: Payer: Self-pay | Admitting: Family Medicine

## 2022-02-05 DIAGNOSIS — D509 Iron deficiency anemia, unspecified: Secondary | ICD-10-CM | POA: Insufficient documentation

## 2022-02-14 ENCOUNTER — Ambulatory Visit: Payer: BC Managed Care – PPO | Admitting: Family Medicine

## 2022-02-17 ENCOUNTER — Other Ambulatory Visit: Payer: Self-pay | Admitting: Family Medicine

## 2022-05-06 ENCOUNTER — Other Ambulatory Visit: Payer: Self-pay

## 2022-05-06 ENCOUNTER — Ambulatory Visit: Payer: BC Managed Care – PPO | Admitting: Internal Medicine

## 2022-05-06 ENCOUNTER — Encounter: Payer: Self-pay | Admitting: Gastroenterology

## 2022-05-06 ENCOUNTER — Ambulatory Visit: Payer: BC Managed Care – PPO | Admitting: Gastroenterology

## 2022-05-06 VITALS — BP 137/84 | HR 66 | Temp 98.7°F | Ht 63.0 in | Wt 247.1 lb

## 2022-05-06 DIAGNOSIS — Z1211 Encounter for screening for malignant neoplasm of colon: Secondary | ICD-10-CM

## 2022-05-06 DIAGNOSIS — R1013 Epigastric pain: Secondary | ICD-10-CM | POA: Diagnosis not present

## 2022-05-06 DIAGNOSIS — D500 Iron deficiency anemia secondary to blood loss (chronic): Secondary | ICD-10-CM | POA: Diagnosis not present

## 2022-05-06 MED ORDER — NA SULFATE-K SULFATE-MG SULF 17.5-3.13-1.6 GM/177ML PO SOLN: 354.0000 mL | Freq: Once | ORAL | 0 refills | Status: AC

## 2022-05-06 NOTE — Progress Notes (Signed)
Cephas Darby, MD [address]  [place]  [place], [place] [address]  Main: [phone]  Fax: [phone]    Gastroenterology Consultation  Referring Provider:     Steele Sizer, MD Primary Care Physician:  Steele Sizer, MD Primary Gastroenterologist:  Dr. Cephas Darby Reason for Consultation: Epigastric pain, nausea, vomiting        HPI:   Sally Mueller is a 46 y.o. female referred by Dr. Steele Sizer, MD  for consultation & management of epigastric pain, nausea vomiting.  Patient had flareup of epigastric pain associated with nausea and vomiting in March 2023.  H. pylori breath test was negative in the past, she has been watchful of what she is eating and her symptoms did not recur.  Patient reports that she has history of reflux and she cut back on drinking coffee.  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  Currently her stress and laxity are well controlled on current regimen.  She is also found to have severe iron deficiency anemia and patient does state that she has heavy menstrual cycles.  She did not have a colonoscopy for colon cancer screening yet Patient does report severe migraine headaches, takes Excedrin as needed  Patient is a Oncologist  NSAIDs: None  Antiplts/Anticoagulants/Anti thrombotics: None  GI Procedures: None She does not smoke or drink alcohol She denies family history of GI malignancy  Past Medical History:  Diagnosis Date   Anxiety    Headache    Metabolic syndrome    Migraine    Migraine without aura, with intractable migraine, so stated, with status migrainosus    Obesity    Seborrhea     Past Surgical History:  Procedure Laterality Date   CESAREAN SECTION  2000, 2002   CHOLECYSTECTOMY  2008?   LIPOMA EXCISION  05/01/2014   TUBAL LIGATION  2002   TUMOR REMOVAL  2012?     Current Outpatient Medications:    clonazePAM (KLONOPIN) 0.5 MG tablet, Take 0.5 mg by mouth 2 (two)  times daily as needed., Disp: , Rfl:    Na Sulfate-K Sulfate-Mg Sulf 17.5-3.13-1.6 GM/177ML SOLN, Take 354 mLs by mouth once for 1 dose., Disp: 354 mL, Rfl: 0   promethazine (PHENERGAN) 25 MG tablet, Take 1 tablet (25 mg total) by mouth every 8 (eight) hours as needed., Disp: 30 tablet, Rfl: 0   sertraline (ZOLOFT) 100 MG tablet, Take 100 mg by mouth daily., Disp: , Rfl:    UBRELVY 100 MG TABS, TAKE 1 TABLET BY MOUTH DAILY AS NEEDED, Disp: 16 tablet, Rfl: 0   valACYclovir (VALTREX) 500 MG tablet, Take 1 tablet (500 mg total) by mouth daily. And twice daily on outbreaks, Disp: 35 tablet, Rfl: 0   Family History  Problem Relation Age of Onset   Cardiomyopathy Mother    Hypertension Mother    Drug abuse Mother    Hyperlipidemia Father    Alcohol abuse Sister    Drug abuse Sister    Bipolar disorder Sister    Schizophrenia Sister    Breast cancer Neg Hx      Social History   Tobacco Use   Smoking status: Former    Years: 8.00    Types: Cigarettes    Start date: 11/17/1993    Quit date: 11/17/2001    Years since quitting: 20.4   Smokeless tobacco: Never  Vaping Use   Vaping Use: Never used  Substance Use Topics  Alcohol use: No    Alcohol/week: 0.0 standard drinks of alcohol   Drug use: No    Allergies as of 05/06/2022 - Review Complete 05/06/2022  Allergen Reaction Noted   Sumatriptan  05/17/2015    Review of Systems:    All systems reviewed and negative except where noted in HPI.   Physical Exam:  BP 137/84 (BP Location: Left Arm, Patient Position: Sitting, Cuff Size: Normal)   Pulse 66   Temp 98.7 F (37.1 C) (Oral)   Ht '5\' 3"'$  (1.6 m)   Wt 247 lb 2 oz (112.1 kg)   BMI 43.78 kg/m  No LMP recorded.  General:   Alert,  Well-developed, well-nourished, pleasant and cooperative in NAD Head:  Normocephalic and atraumatic. Eyes:  Sclera clear, no icterus.   Conjunctiva pink. Ears:  Normal auditory acuity. Nose:  No deformity, discharge, or lesions. Mouth:  No  deformity or lesions,oropharynx pink & moist. Neck:  Supple; no masses or thyromegaly. Lungs:  Respirations even and unlabored.  Clear throughout to auscultation.   No wheezes, crackles, or rhonchi. No acute distress. Heart:  Regular rate and rhythm; no murmurs, clicks, rubs, or gallops. Abdomen:  Normal bowel sounds. Soft, non-tender and non-distended without masses, hepatosplenomegaly or hernias noted.  No guarding or rebound tenderness.   Rectal: Not performed Msk:  Symmetrical without gross deformities. Good, equal movement & strength bilaterally. Pulses:  Normal pulses noted. Extremities:  No clubbing or edema.  No cyanosis. Neurologic:  Alert and oriented x3;  grossly normal neurologically. Skin:  Intact without significant lesions or rashes. No jaundice. Psych:  Alert and cooperative. Normal mood and affect.  Imaging Studies: None  Assessment and Plan:   Sally Mueller is a 46 y.o. present female with BMI of 43.78, history of generalized anxiety, history of severe iron deficiency anemia is seen in consultation for symptoms of dyspepsia  Dyspepsia Recommend EGD with gastric biopsies Recommend trial of over-the-counter Nexium or Prilosec 20 mg p.o. twice daily before meals  Iron deficiency anemia Patient reports history of menorrhagia Recommend EGD with gastric and duodenal biopsies Colonoscopy with possible TI evaluation Trial of fusion plus, samples provided, will send in prescription if patient is able to tolerate fusion  Colon cancer screening Recommend colonoscopy with possible TI evaluation given patient's history of iron deficiency anemia   Follow up via MyChart based on the above work-up   Cephas Darby, MD

## 2022-05-06 NOTE — Patient Instructions (Signed)
Gave fusion Samples

## 2022-05-16 ENCOUNTER — Ambulatory Visit: Payer: BC Managed Care – PPO | Admitting: Family Medicine

## 2022-06-02 ENCOUNTER — Encounter: Payer: Self-pay | Admitting: Gastroenterology

## 2022-06-10 ENCOUNTER — Ambulatory Visit
Admission: RE | Admit: 2022-06-10 | Discharge: 2022-06-10 | Disposition: A | Discharge status: Home / Self Care | Payer: BC Managed Care – PPO | Source: Ambulatory Visit | Attending: Gastroenterology | Admitting: Gastroenterology

## 2022-06-10 ENCOUNTER — Encounter: Payer: Self-pay | Admitting: Gastroenterology

## 2022-06-10 ENCOUNTER — Ambulatory Visit: Payer: BC Managed Care – PPO | Admitting: Anesthesiology

## 2022-06-10 ENCOUNTER — Other Ambulatory Visit: Payer: Self-pay

## 2022-06-10 ENCOUNTER — Encounter
Admission: RE | Disposition: A | Discharge status: Home / Self Care | Payer: Self-pay | Source: Ambulatory Visit | Attending: Gastroenterology

## 2022-06-10 DIAGNOSIS — D509 Iron deficiency anemia, unspecified: Secondary | ICD-10-CM | POA: Insufficient documentation

## 2022-06-10 DIAGNOSIS — R1013 Epigastric pain: Secondary | ICD-10-CM

## 2022-06-10 DIAGNOSIS — D649 Anemia, unspecified: Secondary | ICD-10-CM | POA: Diagnosis not present

## 2022-06-10 DIAGNOSIS — F419 Anxiety disorder, unspecified: Secondary | ICD-10-CM | POA: Insufficient documentation

## 2022-06-10 DIAGNOSIS — Z1211 Encounter for screening for malignant neoplasm of colon: Secondary | ICD-10-CM | POA: Diagnosis present

## 2022-06-10 DIAGNOSIS — K254 Chronic or unspecified gastric ulcer with hemorrhage: Secondary | ICD-10-CM | POA: Insufficient documentation

## 2022-06-10 DIAGNOSIS — K279 Peptic ulcer, site unspecified, unspecified as acute or chronic, without hemorrhage or perforation: Secondary | ICD-10-CM | POA: Diagnosis not present

## 2022-06-10 DIAGNOSIS — K259 Gastric ulcer, unspecified as acute or chronic, without hemorrhage or perforation: Secondary | ICD-10-CM | POA: Diagnosis not present

## 2022-06-10 DIAGNOSIS — D759 Disease of blood and blood-forming organs, unspecified: Secondary | ICD-10-CM | POA: Insufficient documentation

## 2022-06-10 DIAGNOSIS — K319 Disease of stomach and duodenum, unspecified: Secondary | ICD-10-CM | POA: Diagnosis not present

## 2022-06-10 DIAGNOSIS — F32A Depression, unspecified: Secondary | ICD-10-CM | POA: Diagnosis not present

## 2022-06-10 DIAGNOSIS — R519 Headache, unspecified: Secondary | ICD-10-CM | POA: Diagnosis not present

## 2022-06-10 HISTORY — PX: COLONOSCOPY WITH PROPOFOL: SHX5780

## 2022-06-10 HISTORY — PX: ESOPHAGOGASTRODUODENOSCOPY (EGD) WITH PROPOFOL: SHX5813

## 2022-06-10 LAB — POCT PREGNANCY, URINE: Preg Test, Ur: NEGATIVE

## 2022-06-10 SURGERY — COLONOSCOPY WITH PROPOFOL
Anesthesia: General | Site: Rectum

## 2022-06-10 MED ORDER — ACETAMINOPHEN 160 MG/5ML PO SOLN: 325.0000 mg | ORAL | Status: DC | PRN

## 2022-06-10 MED ORDER — OMEPRAZOLE 40 MG PO CPDR: 40.0000 mg | DELAYED_RELEASE_CAPSULE | Freq: Two times a day (BID) | ORAL | 2 refills | Status: DC

## 2022-06-10 MED ORDER — ONDANSETRON HCL 4 MG/2ML IJ SOLN: 4.0000 mg | Freq: Once | INTRAMUSCULAR | Status: DC | PRN

## 2022-06-10 MED ORDER — PROPOFOL 10 MG/ML IV BOLUS
INTRAVENOUS | Status: DC | PRN
  Administered 2022-06-10: 30 mg via INTRAVENOUS
  Administered 2022-06-10 (×3): 50 mg via INTRAVENOUS
  Administered 2022-06-10: 80 mg via INTRAVENOUS

## 2022-06-10 MED ORDER — ACETAMINOPHEN 325 MG PO TABS: 325.0000 mg | ORAL_TABLET | ORAL | Status: DC | PRN

## 2022-06-10 MED ORDER — STERILE WATER FOR IRRIGATION IR SOLN
Status: DC | PRN
  Administered 2022-06-10: 100 mL

## 2022-06-10 MED ORDER — SODIUM CHLORIDE 0.9 % IV SOLN: INTRAVENOUS | Status: DC

## 2022-06-10 MED ORDER — LIDOCAINE HCL (CARDIAC) PF 100 MG/5ML IV SOSY
PREFILLED_SYRINGE | INTRAVENOUS | Status: DC | PRN
  Administered 2022-06-10: 40 mg via INTRAVENOUS

## 2022-06-10 MED ORDER — GLYCOPYRROLATE 0.2 MG/ML IJ SOLN
INTRAMUSCULAR | Status: DC | PRN
  Administered 2022-06-10: .2 mg via INTRAVENOUS

## 2022-06-10 MED ORDER — LACTATED RINGERS IV SOLN: INTRAVENOUS | Status: DC

## 2022-06-10 SURGICAL SUPPLY — 38 items
BALLN DILATOR 10-12 8 (BALLOONS)
BALLN DILATOR 12-15 8 (BALLOONS)
BALLN DILATOR 15-18 8 (BALLOONS)
BALLN DILATOR CRE 0-12 8 (BALLOONS)
BALLN DILATOR ESOPH 8 10 CRE (MISCELLANEOUS) IMPLANT
BALLOON DILATOR 12-15 8 (BALLOONS) IMPLANT
BALLOON DILATOR 15-18 8 (BALLOONS) IMPLANT
BALLOON DILATOR CRE 0-12 8 (BALLOONS) IMPLANT
BLOCK BITE 60FR ADLT L/F GRN (MISCELLANEOUS) ×3 IMPLANT
CLIP HMST 235XBRD CATH ROT (MISCELLANEOUS) IMPLANT
CLIP RESOLUTION 360 11X235 (MISCELLANEOUS)
ELECT REM PT RETURN 9FT ADLT (ELECTROSURGICAL)
ELECTRODE REM PT RTRN 9FT ADLT (ELECTROSURGICAL) IMPLANT
FCP ESCP3.2XJMB 240X2.8X (MISCELLANEOUS)
FORCEPS BIOP RAD 4 LRG CAP 4 (CUTTING FORCEPS) ×1 IMPLANT
FORCEPS BIOP RJ4 240 W/NDL (MISCELLANEOUS)
FORCEPS ESCP3.2XJMB 240X2.8X (MISCELLANEOUS) IMPLANT
GOWN CVR UNV OPN BCK APRN NK (MISCELLANEOUS) ×4 IMPLANT
GOWN ISOL THUMB LOOP REG UNIV (MISCELLANEOUS) ×6
INJECTOR VARIJECT VIN23 (MISCELLANEOUS) IMPLANT
KIT DEFENDO VALVE AND CONN (KITS) IMPLANT
KIT PRC NS LF DISP ENDO (KITS) ×2 IMPLANT
KIT PROCEDURE OLYMPUS (KITS) ×3
MANIFOLD NEPTUNE II (INSTRUMENTS) ×3 IMPLANT
MARKER SPOT ENDO TATTOO 5ML (MISCELLANEOUS) IMPLANT
PROBE APC STR FIRE (PROBE) IMPLANT
RETRIEVER NET PLAT FOOD (MISCELLANEOUS) IMPLANT
RETRIEVER NET ROTH 2.5X230 LF (MISCELLANEOUS) IMPLANT
SNARE COLD EXACTO (MISCELLANEOUS) IMPLANT
SNARE SHORT THROW 13M SML OVAL (MISCELLANEOUS) IMPLANT
SNARE SHORT THROW 30M LRG OVAL (MISCELLANEOUS) IMPLANT
SNARE SNG USE RND 15MM (INSTRUMENTS) IMPLANT
SPOT EX ENDOSCOPIC TATTOO (MISCELLANEOUS)
SYR INFLATION 60ML (SYRINGE) IMPLANT
TRAP ETRAP POLY (MISCELLANEOUS) IMPLANT
VARIJECT INJECTOR VIN23 (MISCELLANEOUS)
WATER STERILE IRR 250ML POUR (IV SOLUTION) ×3 IMPLANT
WIRE CRE 18-20MM 8CM F G (MISCELLANEOUS) IMPLANT

## 2022-06-10 NOTE — Anesthesia Preprocedure Evaluation (Signed)
Anesthesia Evaluation  Patient identified by MRN, date of birth, ID band Patient awake    Reviewed: Allergy & Precautions, NPO status   Airway Mallampati: II  TM Distance: >3 FB     Dental   Pulmonary former smoker,    Pulmonary exam normal        Cardiovascular  Rhythm:Regular Rate:Normal     Neuro/Psych  Headaches, PSYCHIATRIC DISORDERS Anxiety Depression    GI/Hepatic   Endo/Other    Renal/GU      Musculoskeletal   Abdominal   Peds  Hematology  (+) Blood dyscrasia, anemia ,   Anesthesia Other Findings   Reproductive/Obstetrics                             Anesthesia Physical Anesthesia Plan  ASA: 2  Anesthesia Plan: General   Post-op Pain Management:    Induction: Intravenous  PONV Risk Score and Plan: 3 and Propofol infusion, TIVA and Treatment may vary due to age or medical condition  Airway Management Planned: Natural Airway and Nasal Cannula  Additional Equipment:   Intra-op Plan:   Post-operative Plan:   Informed Consent: I have reviewed the patients History and Physical, chart, labs and discussed the procedure including the risks, benefits and alternatives for the proposed anesthesia with the patient or authorized representative who has indicated his/her understanding and acceptance.       Plan Discussed with: CRNA  Anesthesia Plan Comments:         Anesthesia Quick Evaluation

## 2022-06-10 NOTE — Transfer of Care (Signed)
Immediate Anesthesia Transfer of Care Note  Patient: Sally Mueller  Procedure(s) Performed: COLONOSCOPY WITH PROPOFOL (Rectum) ESOPHAGOGASTRODUODENOSCOPY (EGD) WITH PROPOFOL WITH BIOPSIES (Mouth)  Patient Location: PACU  Anesthesia Type: General  Level of Consciousness: awake, alert  and patient cooperative  Airway and Oxygen Therapy: Patient Spontanous Breathing and Patient connected to supplemental oxygen  Post-op Assessment: Post-op Vital signs reviewed, Patient's Cardiovascular Status Stable, Respiratory Function Stable, Patent Airway and No signs of Nausea or vomiting  Post-op Vital Signs: Reviewed and stable  Complications: No notable events documented.

## 2022-06-10 NOTE — Anesthesia Postprocedure Evaluation (Signed)
Anesthesia Post Note  Patient: Sally Mueller  Procedure(s) Performed: COLONOSCOPY WITH PROPOFOL (Rectum) ESOPHAGOGASTRODUODENOSCOPY (EGD) WITH PROPOFOL WITH BIOPSIES (Mouth)     Patient location during evaluation: PACU Anesthesia Type: General Level of consciousness: awake Pain management: pain level controlled Vital Signs Assessment: post-procedure vital signs reviewed and stable Respiratory status: respiratory function stable Cardiovascular status: stable Postop Assessment: no signs of nausea or vomiting Anesthetic complications: no   No notable events documented.  Veda Canning

## 2022-06-10 NOTE — H&P (Signed)
Sally Darby, MD [address]  [place]  [place], [place] [address]  Main: [phone]  Fax: [phone] Pager: [phone]  Primary Care Physician:  Steele Sizer, MD Primary Gastroenterologist:  Dr. Cephas Mueller  Pre-Procedure History & Physical: HPI:  Sally Mueller is a 46 y.o. female is here for an endoscopy and colonoscopy.   Past Medical History:  Diagnosis Date   Anxiety    Headache    Metabolic syndrome    Migraine    Migraine without aura, with intractable migraine, so stated, with status migrainosus    Obesity    Seborrhea     Past Surgical History:  Procedure Laterality Date   CESAREAN SECTION  2000, 2002   CHOLECYSTECTOMY  2008?   LIPOMA EXCISION  05/01/2014   TUBAL LIGATION  2002   TUMOR REMOVAL  2012?    Prior to Admission medications   Medication Sig Start Date End Date Taking? Authorizing Provider  clonazePAM (KLONOPIN) 0.5 MG tablet Take 0.5 mg by mouth 2 (two) times daily as needed.   Yes Leta Speller, FNP  promethazine (PHENERGAN) 25 MG tablet Take 1 tablet (25 mg total) by mouth every 8 (eight) hours as needed. 01/28/22  Yes Sowles, Drue Stager, MD  sertraline (ZOLOFT) 100 MG tablet Take 100 mg by mouth daily. 04/16/22  Yes [provider]  UBRELVY 100 MG TABS TAKE 1 TABLET BY MOUTH DAILY AS NEEDED 02/17/22  Yes Sowles, Drue Stager, MD  valACYclovir (VALTREX) 500 MG tablet Take 1 tablet (500 mg total) by mouth daily. And twice daily on outbreaks 01/28/22  Yes Sowles, Drue Stager, MD    Allergies as of 05/06/2022 - Review Complete 05/06/2022  Allergen Reaction Noted   Sumatriptan  05/17/2015    Family History  Problem Relation Age of Onset   Cardiomyopathy Mother    Hypertension Mother    Drug abuse Mother    Hyperlipidemia Father    Alcohol abuse Sister    Drug abuse Sister    Bipolar disorder Sister    Schizophrenia Sister    Breast cancer Neg Hx     Social History   Socioeconomic History   Marital status: Soil scientist     Spouse name: Not on file   Number of children: 3   Years of education: Not on file   Highest education level: Bachelor's degree (e.g., BA, AB, BS)  Occupational History   Occupation: Pharmacist, hospital  Tobacco Use   Smoking status: Former    Years: 8.00    Types: Cigarettes    Start date: 11/17/1993    Quit date: 11/17/2001    Years since quitting: 20.5   Smokeless tobacco: Never  Vaping Use   Vaping Use: Never used  Substance and Sexual Activity   Alcohol use: No    Alcohol/week: 0.0 standard drinks of alcohol   Drug use: No   Sexual activity: Yes    Partners: Male    Birth control/protection: Other-see comments    Comment: Tubal Ligation  Other Topics Concern   Not on file  Social History Narrative   She graduated from school of education   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 [employer] Fall of 2020   Social Determinants of Health   Financial Resource Strain: Medium Risk (02/23/2018)   Overall Financial Resource Strain (CARDIA)    Difficulty of Paying Living Expenses: Somewhat hard  Food Insecurity: No Food Insecurity (02/23/2018)   Hunger Vital Sign    Worried About  Running Out of Food in the Last Year: Never true    [place] in the Last Year: Never true  Transportation Needs: No Transportation Needs (02/23/2018)   PRAPARE - Hydrologist (Medical): No    Lack of Transportation (Non-Medical): No  Physical Activity: Inactive (02/23/2018)   Exercise Vital Sign    Days of Exercise per Week: 0 days    Minutes of Exercise per Session: 0 min  Stress: Not on file  Social Connections: Moderately Isolated (05/31/2018)   Social Connection and Isolation Panel [NHANES]    Frequency of Communication with Friends and Family: Once a week    Frequency of Social Gatherings with Friends and Family: Once a week    Attends Religious Services: Never    Marine scientist or Organizations: No    Attends Theatre manager Meetings: Never    Marital Status: Living with partner  Intimate Partner Violence: Not At Risk (02/23/2018)   Humiliation, Afraid, Rape, and Kick questionnaire    Fear of Current or Ex-Partner: No    Emotionally Abused: No    Physically Abused: No    Sexually Abused: No    Review of Systems: See HPI, otherwise negative ROS  Physical Exam: BP 134/64   Pulse 97   Temp 98.6 F (37 C) (Temporal)   Resp 16   Ht '5\' 3"'$  (1.6 m)   Wt 110.2 kg   BMI 43.03 kg/m  General:   Alert,  pleasant and cooperative in NAD Head:  Normocephalic and atraumatic. Neck:  Supple; no masses or thyromegaly. Lungs:  Clear throughout to auscultation.    Heart:  Regular rate and rhythm. Abdomen:  Soft, nontender and nondistended. Normal bowel sounds, without guarding, and without rebound.   Neurologic:  Alert and  oriented x4;  grossly normal neurologically.  Impression/Plan: ALISABETH SELKIRK is here for an endoscopy and colonoscopy to be performed for dyspepsia, colon cancer screening  Risks, benefits, limitations, and alternatives regarding  endoscopy and colonoscopy have been reviewed with the patient.  Questions have been answered.  All parties agreeable.   Sherri Sear, MD  06/10/2022, 7:37 AM

## 2022-06-10 NOTE — Op Note (Signed)
[facility] Gastroenterology Patient Name: Sally Mueller Procedure Date: 06/10/2022 8:14 AM MRN: [id number] Account #: [id number] Date of Birth: 05-Apr-1976 Admit Type: Outpatient Age: 46 Room: [facility] OR ROOM 01 Gender: Female Note Status: Finalized Instrument Name: [id number] Procedure:             Upper GI endoscopy Indications:           Unexplained iron deficiency anemia, Dyspepsia Providers:             Lin Landsman MD, MD Referring MD:          Bethena Roys. Sowles, MD (Referring MD) Medicines:             General Anesthesia Complications:         No immediate complications. Estimated blood loss: None. Procedure:             Pre-Anesthesia Assessment:                        - Prior to the procedure, a History and Physical was                         performed, and patient medications and allergies were                         reviewed. The patient is competent. The risks and                         benefits of the procedure and the sedation options and                         risks were discussed with the patient. All questions                         were answered and informed consent was obtained.                         Patient identification and proposed procedure were                         verified by the physician, the nurse, the                         anesthesiologist, the anesthetist and the technician                         in the pre-procedure area in the procedure room in the                         endoscopy suite. Mental Status Examination: alert and                         oriented. Airway Examination: normal oropharyngeal                         airway and neck mobility. Respiratory Examination:                         clear to auscultation. CV Examination: normal.  Prophylactic Antibiotics: The patient does not require                         prophylactic antibiotics. Prior Anticoagulants: The                          patient has taken no previous anticoagulant or                         antiplatelet agents. ASA Grade Assessment: II - A                         patient with mild systemic disease. After reviewing                         the risks and benefits, the patient was deemed in                         satisfactory condition to undergo the procedure. The                         anesthesia plan was to use general anesthesia.                         Immediately prior to administration of medications,                         the patient was re-assessed for adequacy to receive                         sedatives. The heart rate, respiratory rate, oxygen                         saturations, blood pressure, adequacy of pulmonary                         ventilation, and response to care were monitored                         throughout the procedure. The physical status of the                         patient was re-assessed after the procedure.                        After obtaining informed consent, the endoscope was                         passed under direct vision. Throughout the procedure,                         the patient's blood pressure, pulse, and oxygen                         saturations were monitored continuously. The Endoscope                         was introduced through the mouth, and advanced to the  second part of duodenum. The upper GI endoscopy was                         accomplished without difficulty. The patient tolerated                         the procedure well. Findings:      The duodenal bulb and second portion of the duodenum were normal.       Biopsies were taken with a cold forceps for histology.      Two non-bleeding superficial gastric ulcers with a clean ulcer base       (Forrest Class III) were found in the gastric antrum. The largest lesion       was 5 mm in largest dimension.      Multiple dispersed diminutive erosions with stigmata of recent  bleeding       were found in the gastric antrum. Biopsies were taken with a cold       forceps for Helicobacter pylori testing.      The gastric body and incisura were normal. Biopsies were taken with a       cold forceps for Helicobacter pylori testing.      Esophagogastric landmarks were identified: the gastroesophageal junction       was found at 38 cm from the incisors.      The gastroesophageal junction and examined esophagus were normal.      The cardia and gastric fundus were normal on retroflexion. Impression:            - Normal duodenal bulb and second portion of the                         duodenum. Biopsied.                        - Non-bleeding gastric ulcers with a clean ulcer base                         (Forrest Class III).                        - Erosive gastropathy with stigmata of recent                         bleeding. Biopsied.                        - Normal gastric body and incisura. Biopsied.                        - Esophagogastric landmarks identified.                        - Normal gastroesophageal junction and esophagus. Recommendation:        - Await pathology results.                        - Use Prilosec (omeprazole) 40 mg PO BID for 3 months.                        - Repeat upper endoscopy in 3 months to check healing.                        -  Proceed with colonoscopy as scheduled                        See colonoscopy report Procedure Code(s):     --- Professional ---                        [phone], Esophagogastroduodenoscopy, flexible,                         transoral; with biopsy, single or multiple Diagnosis Code(s):     --- Professional ---                        K25.9, Gastric ulcer, unspecified as acute or chronic,                         without hemorrhage or perforation                        K92.2, Gastrointestinal hemorrhage, unspecified                        D50.9, Iron deficiency anemia, unspecified                        R10.13,  Epigastric pain CPT copyright 2019 American Medical Association. All rights reserved. The codes documented in this report are preliminary and upon coder review may  be revised to meet current compliance requirements. Dr. Ulyess Mort Lin Landsman MD, MD 06/10/2022 8:35:29 AM This report has been signed electronically. Number of Addenda: 0 Note Initiated On: 06/10/2022 8:14 AM Total Procedure Duration: 0 hours 7 minutes 56 seconds  Estimated Blood Loss:  Estimated blood loss: none.      [facility]

## 2022-06-10 NOTE — Op Note (Signed)
[facility] Patient Name: Sally Mueller Procedure Date: 06/10/2022 8:13 AM MRN: [id number] Account #: [id number] Date of Birth: 17-Apr-1976 Admit Type: Outpatient Age: 46 Room: [facility] OR ROOM 01 Gender: Female Note Status: Finalized Instrument Name: [id number] Procedure:             Colonoscopy Indications:           Screening for colorectal malignant neoplasm, This is                         the patient's first colonoscopy Providers:             Lin Landsman MD, MD Referring MD:          Bethena Roys. Sowles, MD (Referring MD) Medicines:             General Anesthesia Complications:         No immediate complications. Estimated blood loss: None. Procedure:             Pre-Anesthesia Assessment:                        - Prior to the procedure, a History and Physical was                         performed, and patient medications and allergies were                         reviewed. The patient is competent. The risks and                         benefits of the procedure and the sedation options and                         risks were discussed with the patient. All questions                         were answered and informed consent was obtained.                         Patient identification and proposed procedure were                         verified by the physician, the nurse, the                         anesthesiologist, the anesthetist and the technician                         in the pre-procedure area in the procedure room in the                         endoscopy suite. Mental Status Examination: alert and                         oriented. Airway Examination: normal oropharyngeal                         airway and neck mobility. Respiratory Examination:  clear to auscultation. CV Examination: normal.                         Prophylactic Antibiotics: The patient does not require                         prophylactic antibiotics.  Prior Anticoagulants: The                         patient has taken no previous anticoagulant or                         antiplatelet agents. ASA Grade Assessment: II - A                         patient with mild systemic disease. After reviewing                         the risks and benefits, the patient was deemed in                         satisfactory condition to undergo the procedure. The                         anesthesia plan was to use general anesthesia.                         Immediately prior to administration of medications,                         the patient was re-assessed for adequacy to receive                         sedatives. The heart rate, respiratory rate, oxygen                         saturations, blood pressure, adequacy of pulmonary                         ventilation, and response to care were monitored                         throughout the procedure. The physical status of the                         patient was re-assessed after the procedure.                        After obtaining informed consent, the colonoscope was                         passed under direct vision. Throughout the procedure,                         the patient's blood pressure, pulse, and oxygen                         saturations were monitored continuously. The  Colonoscope was introduced through the anus and                         advanced to the 10 cm into the ileum. The colonoscopy                         was performed without difficulty. The patient                         tolerated the procedure well. The quality of the bowel                         preparation was evaluated using the BBPS [facility] Bowel                         Preparation Scale) with scores of: Right Colon = 3,                         Transverse Colon = 3 and Left Colon = 3 (entire mucosa                         seen well with no residual staining, small fragments                         of  stool or opaque liquid). The total BBPS score                         equals 9. Findings:      The perianal and digital rectal examinations were normal. Pertinent       negatives include normal sphincter tone and no palpable rectal lesions.      The terminal ileum appeared normal.      The entire examined colon appeared normal.      The retroflexed view of the distal rectum and anal verge was normal and       showed no anal or rectal abnormalities. Impression:            - The examined portion of the ileum was normal.                        - The entire examined colon is normal.                        - The distal rectum and anal verge are normal on                         retroflexion view.                        - No specimens collected. Recommendation:        - Discharge patient to home (with escort).                        - Resume previous diet today.                        - Continue present medications.                        -  Repeat colonoscopy in 10 years for screening                         purposes. Procedure Code(s):     --- Professional ---                        S3419, Colorectal cancer screening; colonoscopy on                         individual not meeting criteria for high risk Diagnosis Code(s):     --- Professional ---                        Z12.11, Encounter for screening for malignant neoplasm                         of colon CPT copyright 2019 American Medical Association. All rights reserved. The codes documented in this report are preliminary and upon coder review may  be revised to meet current compliance requirements. Dr. Ulyess Mort Lin Landsman MD, MD 06/10/2022 8:50:59 AM This report has been signed electronically. Number of Addenda: 0 Note Initiated On: 06/10/2022 8:13 AM Scope Withdrawal Time: 0 hours 7 minutes 42 seconds  Total Procedure Duration: 0 hours 10 minutes 18 seconds  Estimated Blood Loss:  Estimated blood loss: none.      [facility]

## 2022-06-11 LAB — SURGICAL PATHOLOGY

## 2022-06-12 ENCOUNTER — Telehealth: Payer: Self-pay

## 2022-06-12 ENCOUNTER — Other Ambulatory Visit: Payer: Self-pay

## 2022-06-12 DIAGNOSIS — D500 Iron deficiency anemia secondary to blood loss (chronic): Secondary | ICD-10-CM

## 2022-06-12 DIAGNOSIS — K279 Peptic ulcer, site unspecified, unspecified as acute or chronic, without hemorrhage or perforation: Secondary | ICD-10-CM

## 2022-06-12 MED ORDER — FUSION PLUS PO CAPS: 1.0000 | ORAL_CAPSULE | Freq: Every day | ORAL | 2 refills | Status: DC

## 2022-06-12 NOTE — Telephone Encounter (Signed)
-----   Message from Lin Landsman, MD sent at 06/11/2022  5:50 PM EDT ----- The biopsy results from upper endoscopy came back normal.  She has gastric ulcers, recommend to continue taking omeprazole 40 mg twice daily before meals for 3 months and repeat EGD in 3 months to confirm healing of gastric ulcers.  Gastric ulcers are the cause of her iron deficiency anemia.  Please check with patient if she is tolerating fusion plus samples, if so, please send in prescription for 3 months, recheck CBC and iron panel in 3 months  RV

## 2022-06-12 NOTE — Telephone Encounter (Signed)
Patient verbalized understanding of results. She schedule EGD for 08/26/2022 and Sent medication to the pharmacy. Order lab work for 3 months. She states she tolerating the iron pill but it makes her have bowel movements more often

## 2022-06-24 ENCOUNTER — Ambulatory Visit: Payer: BC Managed Care – PPO | Admitting: Family Medicine

## 2022-06-24 ENCOUNTER — Encounter: Payer: Self-pay | Admitting: Family Medicine

## 2022-06-24 VITALS — BP 128/80 | HR 98 | Temp 98.4°F | Resp 14 | Ht 63.0 in | Wt 248.6 lb

## 2022-06-24 DIAGNOSIS — K279 Peptic ulcer, site unspecified, unspecified as acute or chronic, without hemorrhage or perforation: Secondary | ICD-10-CM | POA: Diagnosis not present

## 2022-06-24 DIAGNOSIS — G43109 Migraine with aura, not intractable, without status migrainosus: Secondary | ICD-10-CM

## 2022-06-24 DIAGNOSIS — F411 Generalized anxiety disorder: Secondary | ICD-10-CM

## 2022-06-24 DIAGNOSIS — F321 Major depressive disorder, single episode, moderate: Secondary | ICD-10-CM | POA: Diagnosis not present

## 2022-06-24 DIAGNOSIS — E559 Vitamin D deficiency, unspecified: Secondary | ICD-10-CM

## 2022-06-24 DIAGNOSIS — E538 Deficiency of other specified B group vitamins: Secondary | ICD-10-CM

## 2022-06-24 MED ORDER — UBRELVY 100 MG PO TABS: 1.0000 | ORAL_TABLET | Freq: Every day | ORAL | 1 refills | Status: DC | PRN

## 2022-06-24 NOTE — Progress Notes (Signed)
Name: Sally Mueller   MRN: [id number]    DOB: 11-23-1975   Date:06/24/2022       Progress Note  Subjective  Chief Complaint  Follow Up  HPI  Migraine headaches: sometimes with aura , she stopped taking preventive medication. Last one was Atenol.  She is only having about one episode per month , usually around her period but resolved with Roselyn Meier, she works as a Oncologist. She had one episode of migraine last week due to stress - getting ready for the school year   MDD/GAD: currently going to [facility] and is currently taking Zoloft 100  mg and she states going to therapy once a month. She is no longer taking hydroxyzine, panic attacks are sporadic now, takes clonazepam very seldom and is prescribed by psychiatrist   Morbid obesity: BMI above 40, she is not following a healthy since she can only tolerate a bland/starchy diet   PUD: had EGD and colonoscopy, diagnosed with PUD and is taking omeprazole bid now, under the care of Dr. Marius Ditch, she still has dyspepsia and intermittently bloating and epigastric pain   Vitamin D deficiency and B12 Deficiency: needs to resume taking supplementation   Iron deficiency anemia: she is taking iron supplementation, and being treated for PUD. She has difficulty eating anything that is not bland.   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   COLONOSCOPY WITH PROPOFOL N/A 06/10/2022   Procedure: COLONOSCOPY WITH PROPOFOL;  Surgeon: Lin Landsman, MD;  Location: [place];  Service: Endoscopy;  Laterality: N/A;   ESOPHAGOGASTRODUODENOSCOPY (EGD) WITH PROPOFOL N/A 06/10/2022   Procedure: ESOPHAGOGASTRODUODENOSCOPY (EGD) WITH PROPOFOL WITH BIOPSIES;  Surgeon: Lin Landsman, MD;  Location: [place];  Service: Endoscopy;  Laterality: N/A;   LIPOMA EXCISION  05/01/2014   TUBAL LIGATION  2002   TUMOR REMOVAL  2012?    Family History  Problem Relation Age of Onset   Cardiomyopathy Mother    Hypertension Mother    Drug abuse Mother    Hyperlipidemia Father    Alcohol abuse Sister    Drug abuse Sister    Bipolar disorder Sister    Schizophrenia Sister    Breast cancer Neg Hx     Social History   Tobacco Use   Smoking status: Former    Years: 8.00    Types: Cigarettes    Start date: 11/17/1993    Quit date: 11/17/2001    Years since quitting: 20.6   Smokeless tobacco: Never  Substance Use Topics   Alcohol use: No    Alcohol/week: 0.0 standard drinks of alcohol     Current Outpatient Medications:    clonazePAM (KLONOPIN) 0.5 MG tablet, Take 0.5 mg by mouth 2 (two) times daily as needed., Disp: , Rfl:    Iron-FA-B Cmp-C-Biot-Probiotic (FUSION PLUS)  CAPS, Take 1 capsule by mouth daily., Disp: 30 capsule, Rfl: 2   omeprazole (PRILOSEC) 40 MG capsule, Take 1 capsule (40 mg total) by mouth 2 (two) times daily between meals., Disp: 60 capsule, Rfl: 2   promethazine (PHENERGAN) 25 MG tablet, Take 1 tablet (25 mg total) by mouth every 8 (eight) hours as needed., Disp: 30 tablet, Rfl: 0   sertraline (ZOLOFT) 100 MG tablet, Take 100 mg by mouth daily., Disp: , Rfl:    UBRELVY 100 MG TABS, TAKE 1 TABLET BY MOUTH DAILY AS NEEDED, Disp: 16 tablet, Rfl: 0   valACYclovir (VALTREX) 500 MG tablet, Take 1 tablet (500 mg total) by mouth daily. And twice daily on  outbreaks, Disp: 35 tablet, Rfl: 0  Allergies  Allergen Reactions   Sumatriptan     crawling sensation in her body, flushed    I personally reviewed active problem list, medication list, allergies, family history, social history, health maintenance with the patient/caregiver today.   ROS  Constitutional: Negative for fever or weight change.  Respiratory: Negative for cough and shortness of breath.   Cardiovascular: Negative for chest pain or palpitations.  Gastrointestinal: positive for abdominal pain, but no  bowel changes.  Musculoskeletal: Negative for gait problem or joint swelling.  Skin: Negative for rash.  Neurological: Negative for dizziness , positive for intermittent  headache.  No other specific complaints in a complete review of systems (except as listed in HPI above).   Objective  Vitals:   06/24/22 1405  BP: 128/80  Pulse: 98  Resp: 14  Temp: 98.4 F (36.9 C)  TempSrc: Oral  SpO2: 99%  Weight: 248 lb 9.6 oz (112.8 kg)  Height: '5\' 3"'$  (1.6 m)    Body mass index is 44.04 kg/m.  Physical Exam  Constitutional: Patient appears well-developed and well-nourished. Obese  No distress.  HEENT: head atraumatic, normocephalic, pupils equal and reactive to light, neck supple Cardiovascular: Normal rate, regular rhythm and normal heart sounds.  No murmur heard. No BLE edema. Pulmonary/Chest: Effort normal and breath sounds normal. No respiratory distress. Abdominal: Soft.  There is no tenderness. Psychiatric: Patient has a normal mood and affect. behavior is normal. Judgment and thought content normal.    PHQ2/9:    06/24/2022    2:14 PM 01/28/2022   10:00 AM 12/25/2021    9:52 AM 04/22/2021    1:19 PM 01/24/2021   12:09 PM  Depression screen PHQ 2/9  Decreased Interest 0 1 0 0 2  Down, Depressed, Hopeless 1 0 0 0 3  PHQ - 2 Score 1 1 0 0 5  Altered sleeping 1 0  0 3  Tired, decreased energy 0 0  0 3  Change in appetite 0 0  0 0  Feeling bad or failure about  yourself  0 0  0 0  Trouble concentrating 0 0  0 0  Moving slowly or fidgety/restless 0 0  0 0  Suicidal thoughts 0 0  0 0  PHQ-9 Score 2 1  0 11  Difficult doing work/chores Not difficult at all   Not difficult at all     phq 9 is positive     06/24/2022    2:15 PM 12/04/2020    3:51 PM 10/16/2020    3:20 PM 04/18/2019    8:18 AM  GAD 7 : Generalized Anxiety Score  Nervous, Anxious, on Edge '2 1 3 1  '$ Control/stop worrying '2 2 3 3  '$ Worry too much - different things 2  $'2 3 3  'Y$ Trouble relaxing '1 1 3 1  '$ Restless 0 0 0 0  Easily annoyed or irritable 0 0 3 3  Afraid - awful might happen 0 1 1 0  Total GAD 7 Score '7 7 16 11  '$ Anxiety Difficulty Not difficult at all Somewhat difficult  Somewhat difficult      Fall Risk:    06/24/2022    2:04 PM 01/28/2022   10:00 AM 12/25/2021    9:52 AM 04/22/2021    1:19 PM 01/24/2021   12:09 PM  [place] in the past year? 0 0 0 0 0  Number falls in past yr:  0 0 0 0  Injury with Fall?  0 0 0 0  Risk for fall due to : No Fall Risks No Fall Risks     Follow up Falls prevention discussed Falls prevention discussed Falls evaluation completed Falls evaluation completed       Functional Status Survey: Is the patient deaf or have difficulty hearing?: No Does the patient have difficulty seeing, even when wearing glasses/contacts?: No Does the patient have difficulty concentrating, remembering, or making decisions?: No Does the patient have difficulty walking or climbing stairs?: No Does the patient have difficulty dressing or bathing?: No Does the patient have difficulty doing errands alone such as visiting a doctor's office or shopping?: No    Assessment & Plan . 1. Moderate major depression (HCC)  Continue medication   2. Morbid obesity ([place])  Discussed with the patient the risk posed by an increased BMI. Discussed importance of portion control, calorie counting and at least 150 minutes of physical activity weekly. Avoid sweet beverages  and drink more water. Eat at least 6 servings of fruit and vegetables daily    3. Anxiety, generalized   4. PUD (peptic ulcer disease)  Taking PPI   5. Migraine with aura and without status migrainosus, not intractable  - Ubrogepant (UBRELVY) 100 MG TABS; Take 1 tablet by mouth daily as needed.  Dispense: 16 tablet; Refill: 1  6. Vitamin D deficiency  She needs to take supplementation   7. B12 deficiency

## 2022-06-24 NOTE — Patient Instructions (Signed)
Low-FODMAP Eating Plan  FODMAP stands for fermentable oligosaccharides, disaccharides, monosaccharides, and polyols. These are sugars that are hard for some people to digest. A low-FODMAP eating plan may help some people who have irritable bowel syndrome (IBS) and certain other bowel (intestinal) diseases to manage their symptoms. This meal plan can be complicated to follow. Work with a diet and nutrition specialist (dietitian) to make a low-FODMAP eating plan that is right for you. A dietitian can help make sure that you get enough nutrition from this diet. What are tips for following this plan? Reading food labels Check labels for hidden FODMAPs such as: High-fructose syrup. Honey. Agave. Natural fruit flavors. Onion or garlic powder. Choose low-FODMAP foods that contain 3-4 grams of fiber per serving. Check food labels for serving sizes. Eat only one serving at a time to make sure FODMAP levels stay low. Shopping Shop with a list of foods that are recommended on this diet and make a meal plan. Meal planning Follow a low-FODMAP eating plan for up to 6 weeks, or as told by your health care provider or dietitian. To follow the eating plan: Eliminate high-FODMAP foods from your diet completely. Choose only low-FODMAP foods to eat. You will do this for 2-6 weeks. Gradually reintroduce high-FODMAP foods into your diet one at a time. Most people should wait a few days before introducing the next new high-FODMAP food into their meal plan. Your dietitian can recommend how quickly you may reintroduce foods. Keep a daily record of what and how much you eat and drink. Make note of any symptoms that you have after eating. Review your daily record with a dietitian regularly to identify which foods you can eat and which foods you should avoid. General tips Drink enough fluid each day to keep your urine pale yellow. Avoid processed foods. These often have added sugar and may be high in FODMAPs. Avoid  most dairy products, whole grains, and sweeteners. Work with a dietitian to make sure you get enough fiber in your diet. Avoid high FODMAP foods at meals to manage symptoms. Recommended foods Fruits Bananas, oranges, tangerines, lemons, limes, blueberries, raspberries, strawberries, grapes, cantaloupe, honeydew melon, kiwi, papaya, passion fruit, and pineapple. Limited amounts of dried cranberries, banana chips, and shredded coconut. Vegetables Eggplant, zucchini, cucumber, peppers, green beans, bean sprouts, lettuce, arugula, kale, Swiss chard, spinach, collard greens, bok choy, summer squash, potato, and tomato. Limited amounts of corn, carrot, and sweet potato. Green parts of scallions. Grains Gluten-free grains, such as rice, oats, buckwheat, quinoa, corn, polenta, and millet. Gluten-free pasta, bread, or cereal. Rice noodles. Corn tortillas. Meats and other proteins Unseasoned beef, pork, poultry, or fish. Eggs. Bacon. Tofu (firm) and tempeh. Limited amounts of nuts and seeds, such as almonds, walnuts, brazil nuts, pecans, peanuts, nut butters, pumpkin seeds, chia seeds, and sunflower seeds. Dairy Lactose-free milk, yogurt, and kefir. Lactose-free cottage cheese and ice cream. Non-dairy milks, such as almond, coconut, hemp, and rice milk. Non-dairy yogurt. Limited amounts of goat cheese, brie, mozzarella, parmesan, swiss, and other hard cheeses. Fats and oils Butter-free spreads. Vegetable oils, such as olive, canola, and sunflower oil. Seasoning and other foods Artificial sweeteners with names that do not end in "ol," such as aspartame, saccharine, and stevia. Maple syrup, white table sugar, raw sugar, brown sugar, and molasses. Mayonnaise, soy sauce, and tamari. Fresh basil, coriander, parsley, rosemary, and thyme. Beverages Water and mineral water. Sugar-sweetened soft drinks. Small amounts of orange juice or cranberry juice. Black and green tea. Most dry wines.   Coffee. The items listed  above may not be a complete list of foods and beverages you can eat. Contact a dietitian for more information. Foods to avoid Fruits Fresh, dried, and juiced forms of apple, pear, watermelon, peach, plum, cherries, apricots, blackberries, boysenberries, figs, nectarines, and mango. Avocado. Vegetables Chicory root, artichoke, asparagus, cabbage, snow peas, Brussels sprouts, broccoli, sugar snap peas, mushrooms, celery, and cauliflower. Onions, garlic, leeks, and the white part of scallions. Grains Wheat, including kamut, durum, and semolina. Barley and bulgur. Couscous. Wheat-based cereals. Wheat noodles, bread, crackers, and pastries. Meats and other proteins Fried or fatty meat. Sausage. Cashews and pistachios. Soybeans, baked beans, black beans, chickpeas, kidney beans, fava beans, navy beans, lentils, black-eyed peas, and split peas. Dairy Milk, yogurt, ice cream, and soft cheese. Cream and sour cream. Milk-based sauces. Custard. Buttermilk. Soy milk. Seasoning and other foods Any sugar-free gum or candy. Foods that contain artificial sweeteners such as sorbitol, mannitol, isomalt, or xylitol. Foods that contain honey, high-fructose corn syrup, or agave. Bouillon, vegetable stock, beef stock, and chicken stock. Garlic and onion powder. Condiments made with onion, such as hummus, chutney, pickles, relish, salad dressing, and salsa. Tomato paste. Beverages Chicory-based drinks. Coffee substitutes. Chamomile tea. Fennel tea. Sweet or fortified wines such as port or sherry. Diet soft drinks made with isomalt, mannitol, maltitol, sorbitol, or xylitol. Apple, pear, and mango juice. Juices with high-fructose corn syrup. The items listed above may not be a complete list of foods and beverages you should avoid. Contact a dietitian for more information. Summary FODMAP stands for fermentable oligosaccharides, disaccharides, monosaccharides, and polyols. These are sugars that are hard for some people to  digest. A low-FODMAP eating plan is a short-term diet that helps to ease symptoms of certain bowel diseases. The eating plan usually lasts up to 6 weeks. After that, high-FODMAP foods are reintroduced gradually and one at a time. This can help you find out which foods may be causing symptoms. A low-FODMAP eating plan can be complicated. It is best to work with a dietitian who has experience with this type of plan. This information is not intended to replace advice given to you by your health care provider. Make sure you discuss any questions you have with your health care provider. Document Revised: 03/22/2020 Document Reviewed: 03/22/2020 Elsevier Patient Education  2023 Elsevier Inc.  

## 2022-08-19 ENCOUNTER — Encounter: Payer: Self-pay | Admitting: Gastroenterology

## 2022-08-26 ENCOUNTER — Ambulatory Visit
Admission: RE | Admit: 2022-08-26 | Discharge: 2022-08-26 | Disposition: A | Discharge status: Home / Self Care | Payer: BC Managed Care – PPO | Source: Ambulatory Visit | Attending: Gastroenterology | Admitting: Gastroenterology

## 2022-08-26 ENCOUNTER — Ambulatory Visit: Payer: BC Managed Care – PPO | Admitting: Anesthesiology

## 2022-08-26 ENCOUNTER — Encounter
Admission: RE | Disposition: A | Discharge status: Home / Self Care | Payer: Self-pay | Source: Ambulatory Visit | Attending: Gastroenterology

## 2022-08-26 ENCOUNTER — Encounter: Payer: Self-pay | Admitting: Gastroenterology

## 2022-08-26 ENCOUNTER — Other Ambulatory Visit: Payer: Self-pay

## 2022-08-26 DIAGNOSIS — Z6841 Body Mass Index (BMI) 40.0 and over, adult: Secondary | ICD-10-CM | POA: Diagnosis not present

## 2022-08-26 DIAGNOSIS — Z87891 Personal history of nicotine dependence: Secondary | ICD-10-CM | POA: Insufficient documentation

## 2022-08-26 DIAGNOSIS — G4733 Obstructive sleep apnea (adult) (pediatric): Secondary | ICD-10-CM | POA: Insufficient documentation

## 2022-08-26 DIAGNOSIS — E8881 Metabolic syndrome: Secondary | ICD-10-CM | POA: Diagnosis not present

## 2022-08-26 DIAGNOSIS — K259 Gastric ulcer, unspecified as acute or chronic, without hemorrhage or perforation: Secondary | ICD-10-CM | POA: Diagnosis not present

## 2022-08-26 DIAGNOSIS — Z09 Encounter for follow-up examination after completed treatment for conditions other than malignant neoplasm: Secondary | ICD-10-CM | POA: Diagnosis present

## 2022-08-26 DIAGNOSIS — G43909 Migraine, unspecified, not intractable, without status migrainosus: Secondary | ICD-10-CM | POA: Insufficient documentation

## 2022-08-26 DIAGNOSIS — K219 Gastro-esophageal reflux disease without esophagitis: Secondary | ICD-10-CM | POA: Diagnosis not present

## 2022-08-26 DIAGNOSIS — F419 Anxiety disorder, unspecified: Secondary | ICD-10-CM | POA: Insufficient documentation

## 2022-08-26 DIAGNOSIS — K279 Peptic ulcer, site unspecified, unspecified as acute or chronic, without hemorrhage or perforation: Secondary | ICD-10-CM

## 2022-08-26 HISTORY — PX: ESOPHAGOGASTRODUODENOSCOPY (EGD) WITH PROPOFOL: SHX5813

## 2022-08-26 LAB — POCT PREGNANCY, URINE: Preg Test, Ur: NEGATIVE

## 2022-08-26 SURGERY — ESOPHAGOGASTRODUODENOSCOPY (EGD) WITH PROPOFOL
Anesthesia: General

## 2022-08-26 MED ORDER — PROPOFOL 500 MG/50ML IV EMUL
INTRAVENOUS | Status: DC | PRN
  Administered 2022-08-26 (×3): 20 mg via INTRAVENOUS
  Administered 2022-08-26: 100 ug/kg/min via INTRAVENOUS
  Administered 2022-08-26: 20 mg via INTRAVENOUS

## 2022-08-26 MED ORDER — LIDOCAINE HCL (CARDIAC) PF 100 MG/5ML IV SOSY
PREFILLED_SYRINGE | INTRAVENOUS | Status: DC | PRN
  Administered 2022-08-26 (×2): 100 mg via INTRAVENOUS

## 2022-08-26 MED ORDER — STERILE WATER FOR IRRIGATION IR SOLN
Status: DC | PRN
  Administered 2022-08-26: 1000 mL

## 2022-08-26 MED ORDER — LACTATED RINGERS IV SOLN: INTRAVENOUS | Status: DC

## 2022-08-26 MED ORDER — SODIUM CHLORIDE 0.9 % IV SOLN: INTRAVENOUS | Status: DC

## 2022-08-26 SURGICAL SUPPLY — 32 items

## 2022-08-26 NOTE — Anesthesia Postprocedure Evaluation (Signed)
Anesthesia Post Note  Patient: Sally Mueller  Procedure(s) Performed: ESOPHAGOGASTRODUODENOSCOPY (EGD) WITH PROPOFOL  Patient location during evaluation: PACU Anesthesia Type: General Level of consciousness: awake and alert Pain management: pain level controlled Vital Signs Assessment: post-procedure vital signs reviewed and stable Respiratory status: spontaneous breathing, nonlabored ventilation, respiratory function stable and patient connected to nasal cannula oxygen Cardiovascular status: blood pressure returned to baseline and stable Postop Assessment: no apparent nausea or vomiting Anesthetic complications: no   There were no known notable events for this encounter.   Last Vitals:  Vitals:   08/26/22 0750 08/26/22 0800  BP: (!) 123/57 131/72  Pulse: 69 72  Resp: 20 15  Temp: (!) 36.3 C 36.6 C  SpO2: 99% 98%    Last Pain:  Vitals:   08/26/22 0800  TempSrc:   PainSc: 0-No pain                 Precious Haws [facility]

## 2022-08-26 NOTE — H&P (Signed)
Cephas Darby, MD [address]  [place]  [place], [place] [address]  Main: [phone]  Fax: [phone] Pager: [phone]  Primary Care Physician:  Steele Sizer, MD Primary Gastroenterologist:  Dr. Cephas Darby  Pre-Procedure History & Physical: HPI:  Sally Mueller is a 46 y.o. female is here for an endoscopy.   Past Medical History:  Diagnosis Date   Anxiety    Headache    Metabolic syndrome    Migraine    Migraine without aura, with intractable migraine, so stated, with status migrainosus    Obesity    Seborrhea     Past Surgical History:  Procedure Laterality Date   CESAREAN SECTION  2000, 2002   CHOLECYSTECTOMY  2008?   COLONOSCOPY WITH PROPOFOL N/A 06/10/2022   Procedure: COLONOSCOPY WITH PROPOFOL;  Surgeon: Lin Landsman, MD;  Location: [facility];  Service: Endoscopy;  Laterality: N/A;   ESOPHAGOGASTRODUODENOSCOPY (EGD) WITH PROPOFOL N/A 06/10/2022   Procedure: ESOPHAGOGASTRODUODENOSCOPY (EGD) WITH PROPOFOL WITH BIOPSIES;  Surgeon: Lin Landsman, MD;  Location: [facility];  Service: Endoscopy;  Laterality: N/A;   LIPOMA EXCISION  05/01/2014   TUBAL LIGATION  2002   TUMOR REMOVAL  2012?    Prior to Admission medications   Medication Sig Start Date End Date Taking? Authorizing Provider  clonazePAM (KLONOPIN) 0.5 MG tablet Take 0.5 mg by mouth 2 (two) times daily as needed.   Yes Leta Speller, FNP  Iron-FA-B Cmp-C-Biot-Probiotic (FUSION PLUS) CAPS Take 1 capsule by mouth daily. 06/12/22  Yes Ledora Delker, Tally Due, MD  omeprazole (PRILOSEC) 40 MG capsule Take 1 capsule (40 mg total) by mouth 2 (two) times daily between meals. 06/10/22 09/08/22 Yes Keiondre Colee, Tally Due, MD  promethazine (PHENERGAN) 25 MG tablet Take 1 tablet (25 mg total) by mouth every 8 (eight) hours as needed. 01/28/22  Yes Sowles, Drue Stager, MD  sertraline (ZOLOFT) 100 MG tablet Take 100 mg by mouth daily. 04/16/22  Yes Leta Speller, FNP  Ubrogepant  (UBRELVY) 100 MG TABS Take 1 tablet by mouth daily as needed. 06/24/22  Yes Sowles, Drue Stager, MD  valACYclovir (VALTREX) 500 MG tablet Take 1 tablet (500 mg total) by mouth daily. And twice daily on outbreaks 01/28/22  Yes Sowles, Drue Stager, MD    Allergies as of 06/12/2022 - Review Complete 06/10/2022  Allergen Reaction Noted   Sumatriptan  05/17/2015    Family History  Problem Relation Age of Onset   Cardiomyopathy Mother    Hypertension Mother    Drug abuse Mother    Hyperlipidemia Father    Alcohol abuse Sister    Drug abuse Sister    Bipolar disorder Sister    Schizophrenia Sister    Breast cancer Neg Hx     Social History   Socioeconomic History   Marital status: Soil scientist    Spouse name: Not on file   Number of children: 3   Years of education: Not on file   Highest education level: Bachelor's degree (e.g., BA, AB, BS)  Occupational History   Occupation: teacher  Tobacco Use   Smoking status: Former    Years: 8.00    Types: Cigarettes    Start date: 11/17/1993    Quit date: 11/17/2001    Years since quitting: 20.7   Smokeless tobacco: Never  Vaping Use   Vaping Use: Never used  Substance and Sexual Activity   Alcohol use: No    Alcohol/week: 0.0 standard drinks of alcohol   Drug use: No  Sexual activity: Yes    Partners: Male    Birth control/protection: Other-see comments    Comment: Tubal Ligation  Other Topics Concern   Not on file  Social History Narrative   She graduated from school of education   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 [employer] Fall of 2020   Social Determinants of Health   Financial Resource Strain: Medium Risk (02/23/2018)   Overall Financial Resource Strain (CARDIA)    Difficulty of Paying Living Expenses: Somewhat hard  Food Insecurity: No Food Insecurity (02/23/2018)   Hunger Vital Sign    Worried About Running Out of Food in the Last Year: Never true    Ran Out of Food in the Last  Year: Never true  Transportation Needs: No Transportation Needs (02/23/2018)   PRAPARE - Hydrologist (Medical): No    Lack of Transportation (Non-Medical): No  Physical Activity: Inactive (02/23/2018)   Exercise Vital Sign    Days of Exercise per Week: 0 days    Minutes of Exercise per Session: 0 min  Stress: Not on file  Social Connections: Moderately Isolated (05/31/2018)   Social Connection and Isolation Panel [NHANES]    Frequency of Communication with Friends and Family: Once a week    Frequency of Social Gatherings with Friends and Family: Once a week    Attends Religious Services: Never    Marine scientist or Organizations: No    Attends Archivist Meetings: Never    Marital Status: Living with partner  Intimate Partner Violence: Not At Risk (02/23/2018)   Humiliation, Afraid, Rape, and Kick questionnaire    Fear of Current or Ex-Partner: No    Emotionally Abused: No    Physically Abused: No    Sexually Abused: No    Review of Systems: See HPI, otherwise negative ROS  Physical Exam: BP 133/77   Pulse 74   Temp 98.1 F (36.7 C) (Temporal)   Resp 20   Ht '5\' 3"'$  (1.6 m)   Wt 112 kg   LMP 08/16/2022 (Exact Date)   SpO2 97%   BMI 43.75 kg/m  General:   Alert,  pleasant and cooperative in NAD Head:  Normocephalic and atraumatic. Neck:  Supple; no masses or thyromegaly. Lungs:  Clear throughout to auscultation.    Heart:  Regular rate and rhythm. Abdomen:  Soft, nontender and nondistended. Normal bowel sounds, without guarding, and without rebound.   Neurologic:  Alert and  oriented x4;  grossly normal neurologically.  Impression/Plan: Sally Mueller is here for an endoscopy to be performed for h/o gastric ulcer  Risks, benefits, limitations, and alternatives regarding  endoscopy have been reviewed with the patient.  Questions have been answered.  All parties agreeable.   Sherri Sear, MD  08/26/2022, 7:25 AM

## 2022-08-26 NOTE — Op Note (Signed)
[facility] Patient Name: Sally Mueller Procedure Date: 08/26/2022 7:22 AM MRN: [id number] Account #: [id number] Date of Birth: 07-18-1976 Admit Type: Outpatient Age: 46 Room: [facility] OR ROOM 01 Gender: Female Note Status: Finalized Instrument Name: [id number] Procedure:             Upper GI endoscopy Indications:           Follow-up of gastric ulcer Providers:             Lin Landsman MD, MD Referring MD:          Bethena Roys. Sowles, MD (Referring MD) Medicines:             General Anesthesia Complications:         No immediate complications. Estimated blood loss: None. Procedure:             Pre-Anesthesia Assessment:                        - Prior to the procedure, a History and Physical was                         performed, and patient medications and allergies were                         reviewed. The patient is competent. The risks and                         benefits of the procedure and the sedation options and                         risks were discussed with the patient. All questions                         were answered and informed consent was obtained.                         Patient identification and proposed procedure were                         verified by the physician, the nurse, the                         anesthesiologist, the anesthetist and the technician                         in the pre-procedure area in the procedure room in the                         endoscopy suite. Mental Status Examination: alert and                         oriented. Airway Examination: normal oropharyngeal                         airway and neck mobility. Respiratory Examination:                         clear to auscultation. CV Examination: normal.  Prophylactic Antibiotics: The patient does not require                         prophylactic antibiotics. Prior Anticoagulants: The                         patient has taken no  previous anticoagulant or                         antiplatelet agents. ASA Grade Assessment: III - A                         patient with severe systemic disease. After reviewing                         the risks and benefits, the patient was deemed in                         satisfactory condition to undergo the procedure. The                         anesthesia plan was to use general anesthesia.                         Immediately prior to administration of medications,                         the patient was re-assessed for adequacy to receive                         sedatives. The heart rate, respiratory rate, oxygen                         saturations, blood pressure, adequacy of pulmonary                         ventilation, and response to care were monitored                         throughout the procedure. The physical status of the                         patient was re-assessed after the procedure.                        After obtaining informed consent, the endoscope was                         passed under direct vision. Throughout the procedure,                         the patient's blood pressure, pulse, and oxygen                         saturations were monitored continuously. The Endoscope                         was introduced through the mouth, and advanced to the  second part of duodenum. The upper GI endoscopy was                         accomplished without difficulty. The patient tolerated                         the procedure well. Findings:      The duodenal bulb and second portion of the duodenum were normal.      Few non-bleeding superficial gastric ulcers with a clean ulcer base       (Forrest Class III) and healing, improved in size compared to previous       EGD from 05/2022 were found in the gastric antrum. The largest lesion was       3 mm in largest dimension.      The cardia and gastric fundus were normal on retroflexion.      The  gastroesophageal junction and examined esophagus were normal. Impression:            - Normal duodenal bulb and second portion of the                         duodenum.                        - Non-bleeding gastric ulcers with a clean ulcer base                         (Forrest Class III).                        - Normal gastroesophageal junction and esophagus.                        - No specimens collected. Recommendation:        - Discharge patient to home (with escort).                        - Resume previous diet today.                        - Continue present medications.                        - Use Prilosec (omeprazole) 40 mg PO daily for 3                         months. Procedure Code(s):     --- Professional ---                        [phone], Esophagogastroduodenoscopy, flexible,                         transoral; diagnostic, including collection of                         specimen(s) by brushing or washing, when performed                         (separate procedure) Diagnosis Code(s):     --- Professional ---  K25.9, Gastric ulcer, unspecified as acute or chronic,                         without hemorrhage or perforation CPT copyright 2019 American Medical Association. All rights reserved. The codes documented in this report are preliminary and upon coder review may  be revised to meet current compliance requirements. Dr. Ulyess Mort Lin Landsman MD, MD 08/26/2022 7:48:31 AM This report has been signed electronically. Number of Addenda: 0 Note Initiated On: 08/26/2022 7:22 AM Total Procedure Duration: 0 hours 5 minutes 44 seconds  Estimated Blood Loss:  Estimated blood loss: none.      [facility]

## 2022-08-26 NOTE — Anesthesia Preprocedure Evaluation (Signed)
Anesthesia Evaluation  Patient identified by MRN, date of birth, ID band Patient awake    Reviewed: Allergy & Precautions, NPO status , Patient's Chart, lab work & pertinent test results  History of Anesthesia Complications Negative for: history of anesthetic complications  Airway Mallampati: III  TM Distance: <3 FB Neck ROM: full    Dental  (+) Chipped   Pulmonary neg shortness of breath, former smoker,    Pulmonary exam normal        Cardiovascular Exercise Tolerance: Good (-) anginanegative cardio ROS Normal cardiovascular exam     Neuro/Psych  Headaches, negative psych ROS   GI/Hepatic Neg liver ROS, PUD, GERD  Controlled,  Endo/Other  Morbid obesity  Renal/GU negative Renal ROS  negative genitourinary   Musculoskeletal   Abdominal   Peds  Hematology negative hematology ROS (+)   Anesthesia Other Findings Past Medical History: No date: Anxiety No date: Headache No date: Metabolic syndrome No date: Migraine No date: Migraine without aura, with intractable migraine, so stated,  with status migrainosus No date: Obesity No date: Seborrhea  Past Surgical History: 2000, 2002: CESAREAN SECTION 2008?: CHOLECYSTECTOMY 06/10/2022: COLONOSCOPY WITH PROPOFOL; N/A     Comment:  Procedure: COLONOSCOPY WITH PROPOFOL;  Surgeon: Lin Landsman, MD;  Location: [facility];                Service: Endoscopy;  Laterality: N/A; 06/10/2022: ESOPHAGOGASTRODUODENOSCOPY (EGD) WITH PROPOFOL; N/A     Comment:  Procedure: ESOPHAGOGASTRODUODENOSCOPY (EGD) WITH               PROPOFOL WITH BIOPSIES;  Surgeon: Lin Landsman,               MD;  Location: [facility];  Service: Endoscopy;               Laterality: N/A; 05/01/2014: LIPOMA EXCISION 2002: TUBAL LIGATION 2012?: TUMOR REMOVAL  BMI    Body Mass Index: 43.75 kg/m      Reproductive/Obstetrics negative OB ROS                              Anesthesia Physical Anesthesia Plan  ASA: 3  Anesthesia Plan: General   Post-op Pain Management:    Induction: Intravenous  PONV Risk Score and Plan: Propofol infusion and TIVA  Airway Management Planned: Natural Airway and Nasal Cannula  Additional Equipment:   Intra-op Plan:   Post-operative Plan:   Informed Consent: I have reviewed the patients History and Physical, chart, labs and discussed the procedure including the risks, benefits and alternatives for the proposed anesthesia with the patient or authorized representative who has indicated his/her understanding and acceptance.     Dental Advisory Given  Plan Discussed with: Anesthesiologist, CRNA and Surgeon  Anesthesia Plan Comments: (Patient consented for risks of anesthesia including but not limited to:  - adverse reactions to medications - risk of airway placement if required - damage to eyes, teeth, lips or other oral mucosa - nerve damage due to positioning  - sore throat or hoarseness - Damage to heart, brain, nerves, lungs, other parts of body or loss of life  Patient voiced understanding.)        Anesthesia Quick Evaluation

## 2022-08-26 NOTE — Transfer of Care (Signed)
Immediate Anesthesia Transfer of Care Note  Patient: Sally Mueller  Procedure(s) Performed: ESOPHAGOGASTRODUODENOSCOPY (EGD) WITH PROPOFOL  Patient Location: PACU  Anesthesia Type: General  Level of Consciousness: awake, alert  and patient cooperative  Airway and Oxygen Therapy: Patient Spontanous Breathing and Patient connected to supplemental oxygen  Post-op Assessment: Post-op Vital signs reviewed, Patient's Cardiovascular Status Stable, Respiratory Function Stable, Patent Airway and No signs of Nausea or vomiting  Post-op Vital Signs: Reviewed and stable  Complications: There were no known notable events for this encounter.

## 2022-08-27 ENCOUNTER — Encounter: Payer: Self-pay | Admitting: Gastroenterology

## 2022-08-27 LAB — CBC
Hematocrit: 35.2 % (ref 34.0–46.6)
Hemoglobin: 10.9 g/dL — ABNORMAL LOW (ref 11.1–15.9)
MCH: 24.3 pg — ABNORMAL LOW (ref 26.6–33.0)
MCHC: 31 g/dL — ABNORMAL LOW (ref 31.5–35.7)
MCV: 79 fL (ref 79–97)
Platelets: 282 10*3/uL (ref 150–450)
RBC: 4.48 x10E6/uL (ref 3.77–5.28)
RDW: 14.3 % (ref 11.7–15.4)
WBC: 7 10*3/uL (ref 3.4–10.8)

## 2022-08-27 LAB — IRON,TIBC AND FERRITIN PANEL
Ferritin: 8 ng/mL — ABNORMAL LOW (ref 15–150)
Iron Saturation: 5 % — CL (ref 15–55)
Iron: 18 ug/dL — ABNORMAL LOW (ref 27–159)
Total Iron Binding Capacity: 375 ug/dL (ref 250–450)
UIBC: 357 ug/dL (ref 131–425)

## 2022-08-28 ENCOUNTER — Telehealth: Payer: Self-pay

## 2022-08-28 DIAGNOSIS — D500 Iron deficiency anemia secondary to blood loss (chronic): Secondary | ICD-10-CM

## 2022-08-28 NOTE — Telephone Encounter (Signed)
Patient verbalized understanding of results. She states she will continue the medication. Informed patient I will call her to remind her of the lab work in 3 months

## 2022-08-28 NOTE — Telephone Encounter (Signed)
-----   Message from Lin Landsman, MD sent at 08/28/2022  1:07 PM EDT ----- Please inform patient that she is still mildly anemic with a severe iron deficiency, continue fusion plus daily for 3 months and recheck CBC and iron panel in 3 months  RV

## 2022-09-18 ENCOUNTER — Ambulatory Visit: Payer: BC Managed Care – PPO | Admitting: Family Medicine

## 2022-09-18 ENCOUNTER — Encounter: Payer: Self-pay | Admitting: Family Medicine

## 2022-09-18 VITALS — BP 118/82 | HR 90 | Temp 98.5°F | Resp 18 | Ht 63.0 in | Wt 248.2 lb

## 2022-09-18 DIAGNOSIS — J0100 Acute maxillary sinusitis, unspecified: Secondary | ICD-10-CM | POA: Diagnosis not present

## 2022-09-18 MED ORDER — AZITHROMYCIN 500 MG PO TABS: 500.0000 mg | ORAL_TABLET | Freq: Every day | ORAL | 0 refills | Status: DC

## 2022-09-18 NOTE — Progress Notes (Signed)
Name: Sally Mueller   MRN: [id number]    DOB: [birth year]   Date:09/18/2022       Progress Note  Subjective  Chief Complaint  Chief Complaint  Patient presents with   Sinusitis    Facial pian and left ear pain onset 1 week    HPI  Sinus pressure: symptoms started over one week ago with URI, like sore throat, rhinorrhea, symptoms improved for a few days and over the past two days having constant pain described as tightness and pressure  on left side of face, sometimes upper teeth hurts, puffy on left maxillary area, and left ear when she blows her nose or sneezes. No fever or chills.   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   Posterior neck pain 08/30/2015   IBS (irritable bowel syndrome) 07/12/2015   Anxiety, generalized 05/17/2015   Headache disorder 05/17/2015   Dysmenorrhea 16/38/4665   Dysmetabolic syndrome 99/35/7017   Migraine with aura and without status migrainosus 05/17/2015   Morbid obesity ([place]) 05/17/2015    Social History   Tobacco Use   Smoking status: Former    Years: 8.00    Types: Cigarettes    Start date: 11/17/1993    Quit date: 11/17/2001    Years since quitting: 20.8   Smokeless tobacco: Never  Substance Use Topics   Alcohol use: No    Alcohol/week: 0.0 standard drinks of alcohol     Current Outpatient Medications:    clonazePAM (KLONOPIN) 0.5 MG tablet, Take 0.5 mg by mouth 2 (two) times daily as needed., Disp: , Rfl:    Iron-FA-B Cmp-C-Biot-Probiotic (FUSION PLUS) CAPS, Take 1 capsule by mouth daily., Disp: 30 capsule, Rfl: 2   promethazine (PHENERGAN) 25 MG tablet, Take 1 tablet (25 mg total) by mouth every 8 (eight) hours as needed., Disp: 30 tablet, Rfl: 0   sertraline  (ZOLOFT) 100 MG tablet, Take 100 mg by mouth daily., Disp: , Rfl:    Ubrogepant (UBRELVY) 100 MG TABS, Take 1 tablet by mouth daily as needed., Disp: 16 tablet, Rfl: 1   valACYclovir (VALTREX) 500 MG tablet, Take 1 tablet (500 mg total) by mouth daily. And twice daily on outbreaks, Disp: 35 tablet, Rfl: 0   omeprazole (PRILOSEC) 40 MG capsule, Take 1 capsule (40 mg total) by mouth 2 (two) times daily between meals., Disp: 60 capsule, Rfl: 2  Allergies  Allergen Reactions   Sumatriptan     crawling sensation in her body, flushed    ROS  Ten systems reviewed and is negative except as mentioned in HPI   Objective  Vitals:   09/18/22 0922  BP: 118/82  Pulse: 90  Resp: 18  Temp: 98.5 F (36.9 C)  SpO2: 98%  Weight: 248 lb 3.2 oz (112.6 kg)  Height: '5\' 3"'$  (1.6 m)    Body mass index is 43.97 kg/m.    Physical Exam  Constitutional: Patient appears well-developed and well-nourished. Obese  No distress.  HEENT: head atraumatic, normocephalic, pupils equal and reactive to light, ears normal TM, tender during percussion of left sinus , boggy turbinates, neck supple, throat within normal limits Cardiovascular: Normal rate, regular rhythm and normal heart sounds.  No murmur  heard. No BLE edema. Pulmonary/Chest: Effort normal and breath sounds normal. No respiratory distress. Abdominal: Soft.  There is no tenderness. Psychiatric: Patient has a normal mood and affect. behavior is normal. Judgment and thought content normal.   Recent Results (from the past 2160 hour(s))  Pregnancy, urine POC     Status: None   Collection Time: 08/26/22  6:45 AM  Result Value Ref Range   Preg Test, Ur NEGATIVE NEGATIVE    Comment:        THE SENSITIVITY OF THIS METHODOLOGY IS >24 mIU/mL   CBC     Status: Abnormal   Collection Time: 08/26/22  8:47 AM  Result Value Ref Range   WBC 7.0 3.4 - 10.8 x10E3/uL   RBC 4.48 3.77 - 5.28 x10E6/uL   Hemoglobin 10.9 (L) 11.1 - 15.9 g/dL   Hematocrit 35.2  34.0 - 46.6 %   MCV 79 79 - 97 fL   MCH 24.3 (L) 26.6 - 33.0 pg   MCHC 31.0 (L) 31.5 - 35.7 g/dL   RDW 14.3 11.7 - 15.4 %   Platelets 282 150 - 450 x10E3/uL  Fe+TIBC+Fer     Status: Abnormal   Collection Time: 08/26/22  8:47 AM  Result Value Ref Range   Total Iron Binding Capacity 375 250 - 450 ug/dL   UIBC 357 131 - 425 ug/dL   Iron 18 (L) 27 - 159 ug/dL   Iron Saturation 5 (LL) 15 - 55 %   Ferritin 8 (L) 15 - 150 ng/mL     Assessment & Plan  1. Acute maxillary sinusitis, recurrence not specified  - azithromycin (ZITHROMAX) 500 MG tablet; Take 1 tablet (500 mg total) by mouth daily.  Dispense: 3 tablet; Refill: 0   Saline nasal spray, flonase prn

## 2022-10-14 ENCOUNTER — Other Ambulatory Visit: Payer: Self-pay | Admitting: Gastroenterology

## 2022-10-15 NOTE — Telephone Encounter (Signed)
Per EGD report use Omeprazole '40mg'$  once a day for 3 months

## 2022-11-12 ENCOUNTER — Other Ambulatory Visit: Payer: Self-pay | Admitting: Family Medicine

## 2022-11-12 DIAGNOSIS — B001 Herpesviral vesicular dermatitis: Secondary | ICD-10-CM

## 2022-11-12 MED ORDER — VALACYCLOVIR HCL 500 MG PO TABS: 500.0000 mg | ORAL_TABLET | Freq: Every day | ORAL | 0 refills | Status: AC

## 2022-11-28 ENCOUNTER — Telehealth: Payer: Self-pay

## 2022-11-28 NOTE — Telephone Encounter (Signed)
-----  Message from Shelby Mattocks, [place] sent at 08/28/2022  1:49 PM EDT ----- Needs Iron panel and CBC

## 2022-11-28 NOTE — Telephone Encounter (Signed)
Patient verbalized understanding. She states she has a upcoming appointment with PCP and wants them to do it. Informed patient that is not till February 9 and she needs to have this done before then. Also states that providers will not draw other providers labs. Patient states she will try to get to the lab

## 2022-12-02 ENCOUNTER — Encounter: Payer: Self-pay | Admitting: Family Medicine

## 2022-12-03 ENCOUNTER — Telehealth (INDEPENDENT_AMBULATORY_CARE_PROVIDER_SITE_OTHER): Payer: BC Managed Care – PPO | Admitting: Nurse Practitioner

## 2022-12-03 ENCOUNTER — Other Ambulatory Visit: Payer: Self-pay

## 2022-12-03 ENCOUNTER — Encounter: Payer: Self-pay | Admitting: Nurse Practitioner

## 2022-12-03 DIAGNOSIS — U071 COVID-19: Secondary | ICD-10-CM | POA: Diagnosis not present

## 2022-12-03 MED ORDER — NIRMATRELVIR/RITONAVIR (PAXLOVID)TABLET: 3.0000 | ORAL_TABLET | Freq: Two times a day (BID) | ORAL | 0 refills | Status: AC

## 2022-12-03 MED ORDER — BENZONATATE 100 MG PO CAPS: 200.0000 mg | ORAL_CAPSULE | Freq: Two times a day (BID) | ORAL | 0 refills | Status: DC | PRN

## 2022-12-03 NOTE — Progress Notes (Signed)
Name: Sally Mueller   MRN: [id number]    DOB: 08-Jun-1976   Date:12/03/2022       Progress Note  Subjective  Chief Complaint  Chief Complaint  Patient presents with   Covid Positive    Headache, cough, scratchy throat, fever for 4 days    I connected with  Duanne Limerick  on 12/03/22 at  9:40 AM EST by a video enabled telemedicine application and verified that I am speaking with the correct person using two identifiers.  I discussed the limitations of evaluation and management by telemedicine and the availability of in person appointments. The patient expressed understanding and agreed to proceed with a virtual visit  Staff also discussed with the patient that there may be a patient responsible charge related to this service. Patient Location: home Provider Location: [facility] Additional Individuals present: alone  HPI  Covid:  symptoms started four days ago. Symptoms include headache, fever, cough, sore throat and nasal congestion.  She denies any shortness of breath. She has taken dayquil/nyquil.  She says she tested positive for covid yesterday.  Discussed paxlovid side effects and she would like to take the medication.  Will also send in tessalon perls for cough. Recommend taking zyrtec, flonase, mucinex, vitamin d, vitamin c, and zinc. Push fluids and get rest.     Patient Active Problem List   Diagnosis Date Noted   Dyspepsia    PUD (peptic ulcer disease)    Gastric erosion    Iron deficiency anemia 02/05/2022   Mixed incontinence urge and stress 09/07/2018   Other situational type phobia 05/31/2018   Moderate major depression ([place]) 02/23/2018   Posterior neck pain 08/30/2015   IBS (irritable bowel syndrome) 07/12/2015   Anxiety, generalized 05/17/2015   Headache disorder 05/17/2015   Dysmenorrhea 41/93/7902   Dysmetabolic syndrome 40/97/3532   Migraine with aura and without status migrainosus 05/17/2015   Morbid obesity ([place]) 05/17/2015    Social History   Tobacco Use    Smoking status: Former    Years: 8.00    Types: Cigarettes    Start date: 11/17/1993    Quit date: 11/17/2001    Years since quitting: 21.0   Smokeless tobacco: Never  Substance Use Topics   Alcohol use: No    Alcohol/week: 0.0 standard drinks of alcohol     Current Outpatient Medications:    clonazePAM (KLONOPIN) 0.5 MG tablet, Take 0.5 mg by mouth 2 (two) times daily as needed., Disp: , Rfl:    Iron-FA-B Cmp-C-Biot-Probiotic (FUSION PLUS) CAPS, Take 1 capsule by mouth daily., Disp: 30 capsule, Rfl: 2   omeprazole (PRILOSEC) 40 MG capsule, Take 1 capsule (40 mg total) by mouth daily., Disp: 30 capsule, Rfl: 2   promethazine (PHENERGAN) 25 MG tablet, Take 1 tablet (25 mg total) by mouth every 8 (eight) hours as needed., Disp: 30 tablet, Rfl: 0   sertraline (ZOLOFT) 100 MG tablet, Take 100 mg by mouth daily., Disp: , Rfl:    Ubrogepant (UBRELVY) 100 MG TABS, Take 1 tablet by mouth daily as needed., Disp: 16 tablet, Rfl: 1   valACYclovir (VALTREX) 500 MG tablet, Take 1 tablet (500 mg total) by mouth daily. And twice daily on outbreaks, Disp: 35 tablet, Rfl: 0   azithromycin (ZITHROMAX) 500 MG tablet, Take 1 tablet (500 mg total) by mouth daily. (Patient not taking: Reported on 12/03/2022), Disp: 3 tablet, Rfl: 0  Allergies  Allergen Reactions   Sumatriptan     crawling sensation in her body,  flushed    I personally reviewed active problem list, medication list, allergies, notes from last encounter with the patient/caregiver today.  ROS  Constitutional: positive for fever or weight change.  HEENT: positive for sore throat, nasal congestion Respiratory: positive for cough and negative for shortness of breath.   Cardiovascular: Negative for chest pain or palpitations.  Gastrointestinal: Negative for abdominal pain, no bowel changes.  Musculoskeletal: Negative for gait problem or joint swelling.  Skin: Negative for rash.  Neurological: Negative for dizziness, positive for headache.  No  other specific complaints in a complete review of systems (except as listed in HPI above).   Objective  Virtual encounter, vitals not obtained.  There is no height or weight on file to calculate BMI.  Nursing Note and Vital Signs reviewed.  Physical Exam  Awake, alert and oriented, speaking in complete sentences  No results found for this or any previous visit (from the past 72 hour(s)).  Assessment & Plan  1. COVID-19 Recommend taking zyrtec, flonase, mucinex, vitamin d, vitamin c, and zinc. Push fluids and get rest.    Discussed reasons to seek emergency care - nirmatrelvir/ritonavir (PAXLOVID) 20 x 150 MG & 10 x '100MG'$  TABS; Take 3 tablets by mouth 2 (two) times daily for 5 days. (Take nirmatrelvir 150 mg two tablets twice daily for 5 days and ritonavir 100 mg one tablet twice daily for 5 days) Patient GFR is 110  Dispense: 30 tablet; Refill: 0 - benzonatate (TESSALON) 100 MG capsule; Take 2 capsules (200 mg total) by mouth 2 (two) times daily as needed for cough.  Dispense: 20 capsule; Refill: 0   -Red flags and when to present for emergency care or RTC including fever >101.40F, chest pain, shortness of breath, new/worsening/un-resolving symptoms,  reviewed with patient at time of visit. Follow up and care instructions discussed and provided in AVS. - I discussed the assessment and treatment plan with the patient. The patient was provided an opportunity to ask questions and all were answered. The patient agreed with the plan and demonstrated an understanding of the instructions.  I provided 15 minutes of non-face-to-face time during this encounter.  Bo Merino, FNP

## 2022-12-25 NOTE — Progress Notes (Signed)
Name: Sally Mueller   MRN: [id number]    DOB: 30-Nov-1975   Date:12/26/2022       Progress Note  Subjective  Chief Complaint  Follow Up  HPI  Migraine headaches: sometimes with aura , she stopped taking preventive medication. Last one was Atenol.  She is only having about one episode per month , usually around her period but resolved with Roselyn Meier, she works as a Oncologist. She takes medication about once a month and she has not missed any days at work lately   MDD/GAD: currently going to [facility] and is currently taking Zoloft 200 mg since her anxiety and depression were increasing, she is off clonazepam and is on lorazepam prn about  once a week . She dose was adjusted recently   Morbid obesity: BMI above 40, she states her depression has been worse, recently adjusted her medications, she states stressed at work and also with her son Sally Mueller. She spent her winter break sleeping and eating. Discussed weight loss medication . She is not currently following a diet. Discussed Weight Watchers and also intermittent fasting   PUD: had EGD and colonoscopy, diagnosed with PUD and is taking omeprazole once a day, under the care of Dr. Marius Ditch, she states symptoms have improved   Vitamin D deficiency and B12 Deficiency:   Iron deficiency anemia: she is taking iron supplementation, ulcer has improved but still present when done 08/2022. We will recheck labs today   Acute left knee pain: she has a history of lipoma and has a small mass on  medial left knee but it never caused pain , she states she has notice sensitivity to touch of the spot but also pain when shifting from standing to sitting or sitting to standing. No redness or swelling. She had falls in the past few months. She states in Dec she was stepping down from a step stools, left knee gave in ( medially) but did not noticed pain immediately.  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   COLONOSCOPY WITH PROPOFOL N/A 06/10/2022   Procedure: COLONOSCOPY WITH PROPOFOL;  Surgeon: Lin Landsman, MD;  Location: [facility];  Service: Endoscopy;  Laterality: N/A;   ESOPHAGOGASTRODUODENOSCOPY (EGD) WITH PROPOFOL N/A 06/10/2022   Procedure: ESOPHAGOGASTRODUODENOSCOPY (EGD) WITH PROPOFOL WITH BIOPSIES;  Surgeon: Lin Landsman, MD;  Location: [place];  Service: Endoscopy;  Laterality: N/A;   ESOPHAGOGASTRODUODENOSCOPY (EGD) WITH PROPOFOL N/A 08/26/2022   Procedure: ESOPHAGOGASTRODUODENOSCOPY (EGD) WITH PROPOFOL;  Surgeon: Lin Landsman, MD;  Location: [place];  Service: Endoscopy;  Laterality: N/A;   LIPOMA EXCISION  05/01/2014   TUBAL LIGATION  2002   TUMOR REMOVAL  2012?  Family History  Problem Relation Age of Onset   Cardiomyopathy Mother    Hypertension Mother    Drug abuse Mother    Hyperlipidemia Father    Alcohol abuse Sister    Drug abuse Sister    Bipolar disorder Sister    Schizophrenia Sister    Breast cancer Neg Hx     Social History   Tobacco Use   Smoking status: Former    Years: 8.00    Types: Cigarettes    Start date: 11/17/1993    Quit date: 11/17/2001    Years since quitting: 21.1   Smokeless tobacco: Never   Substance Use Topics   Alcohol use: No    Alcohol/week: 0.0 standard drinks of alcohol     Current Outpatient Medications:    Iron-FA-B Cmp-C-Biot-Probiotic (FUSION PLUS) CAPS, Take 1 capsule by mouth daily., Disp: 30 capsule, Rfl: 2   LORazepam (ATIVAN) 0.5 MG tablet, Take 0.5 mg by mouth daily as needed., Disp: , Rfl:    omeprazole (PRILOSEC) 40 MG capsule, Take 1 capsule (40 mg total) by mouth daily., Disp: 30 capsule, Rfl: 2   promethazine (PHENERGAN) 25 MG tablet, Take 1 tablet (25 mg total) by mouth every 8 (eight) hours as needed., Disp: 30 tablet, Rfl: 0   sertraline (ZOLOFT) 100 MG tablet, Take 200 mg by mouth daily., Disp: , Rfl:    Ubrogepant (UBRELVY) 100 MG TABS, Take 1 tablet by mouth daily as needed., Disp: 16 tablet, Rfl: 1   valACYclovir (VALTREX) 500 MG tablet, Take 1 tablet (500 mg total) by mouth daily. And twice daily on outbreaks, Disp: 35 tablet, Rfl: 0  Allergies  Allergen Reactions   Sumatriptan     crawling sensation in her body, flushed    I personally reviewed active problem list, medication list, allergies, family history, social history, health maintenance with the patient/caregiver today.   ROS  Constitutional: Negative for fever , positive for weight change.  Respiratory: Negative for cough and shortness of breath.   Cardiovascular: Negative for chest pain or palpitations.  Gastrointestinal: Negative for abdominal pain, no bowel changes.  Musculoskeletal: Negative for gait problem or joint swelling.  Skin: Negative for rash.  Neurological: Negative for dizziness, positive for intermittent  headache.  No other specific complaints in a complete review of systems (except as listed in HPI above).   Objective  Vitals:   12/26/22 1504  BP: 116/82  Pulse: 84  Resp: 16  SpO2: 98%  Weight: 259 lb 1.6 oz (117.5 kg)  Height: 5' 4"$  (1.626 m)    Body mass index is 44.47 kg/m.  Physical Exam  Constitutional: Patient appears well-developed and  well-nourished. Obese  No distress.  HEENT: head atraumatic, normocephalic, pupils equal and reactive to light, neck supple Cardiovascular: Normal rate, regular rhythm and normal heart sounds.  No murmur heard. No BLE edema. Pulmonary/Chest: Effort normal and breath sounds normal. No respiratory distress. Abdominal: Soft.  There is no tenderness. Muscular skeletal: small cyst on left medial knee, pain with McMurray's , mild effusion, no redness or increase in warmth Psychiatric: Patient has a normal mood and affect. behavior is normal. Judgment and thought content normal.    PHQ2/9:    12/26/2022    3:07 PM 12/03/2022    9:36 AM 09/18/2022    9:25 AM 06/24/2022    2:14 PM 01/28/2022   10:00 AM  Depression screen PHQ 2/9  Decreased Interest 1 0 0 0 1  Down, Depressed, Hopeless 1 0 1 1  0  PHQ - 2 Score 2 0 1 1 1  $ Altered sleeping 2  0 1 0  Tired, decreased energy 1  0 0 0  Change in appetite 0  0 0 0  Feeling bad or failure about yourself  0  0 0 0  Trouble concentrating 0  0 0 0  Moving slowly or fidgety/restless 0  0 0 0  Suicidal thoughts 0  0 0 0  PHQ-9 Score 5  1 2 1  $ Difficult doing work/chores Somewhat difficult  Somewhat difficult Not difficult at all     phq 9 is positive    Fall Risk:    12/26/2022    3:06 PM 12/03/2022    9:35 AM 09/18/2022    9:22 AM 06/24/2022    2:04 PM 01/28/2022   10:00 AM  Fall Risk   Falls in the past year? 1 0 0 0 0  Number falls in past yr: 1 0 0  0  Injury with Fall? 0 0 0  0  Risk for fall due to : History of fall(s)   No Fall Risks No Fall Risks  Follow up Falls prevention discussed;Education provided;Falls evaluation completed Falls evaluation completed  Falls prevention discussed Falls prevention discussed    Assessment & Plan  1. Morbid obesity ([place])  She will check medication coverage with insurance She will read about intermittent fasting   2. Moderate major depression ([place])  Feeling worse, medication recently adjusted by  psychiatrist, going to a therapist every 2 weeks   3. B12 deficiency  - B12 and Folate Panel  4. Vitamin D deficiency  - VITAMIN D 25 Hydroxy (Vit-D Deficiency, Fractures)  5. Anxiety, generalized  Keep follow up with psychiatrist   6. Migraine with aura and without status migrainosus, not intractable  Doing well   7. PUD (peptic ulcer disease)  Keep follow up with Dr. Marius Ditch  8. Irritable bowel syndrome with diarrhea   9. 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 Iron deficiency anemia due to chronic blood loss  - CBC with Differential/Platelet - Iron, TIBC and Ferritin Panel  12. Long-term use of high-risk medication  - COMPLETE METABOLIC PANEL WITH GFR  13. Lipid screening  - Lipid panel

## 2022-12-26 ENCOUNTER — Ambulatory Visit: Payer: BC Managed Care – PPO | Admitting: Family Medicine

## 2022-12-26 ENCOUNTER — Encounter: Payer: Self-pay | Admitting: Family Medicine

## 2022-12-26 DIAGNOSIS — K580 Irritable bowel syndrome with diarrhea: Secondary | ICD-10-CM

## 2022-12-26 DIAGNOSIS — M25362 Other instability, left knee: Secondary | ICD-10-CM

## 2022-12-26 DIAGNOSIS — G43109 Migraine with aura, not intractable, without status migrainosus: Secondary | ICD-10-CM

## 2022-12-26 DIAGNOSIS — F411 Generalized anxiety disorder: Secondary | ICD-10-CM

## 2022-12-26 DIAGNOSIS — Z79899 Other long term (current) drug therapy: Secondary | ICD-10-CM

## 2022-12-26 DIAGNOSIS — E559 Vitamin D deficiency, unspecified: Secondary | ICD-10-CM

## 2022-12-26 DIAGNOSIS — D500 Iron deficiency anemia secondary to blood loss (chronic): Secondary | ICD-10-CM

## 2022-12-26 DIAGNOSIS — K279 Peptic ulcer, site unspecified, unspecified as acute or chronic, without hemorrhage or perforation: Secondary | ICD-10-CM

## 2022-12-26 DIAGNOSIS — F321 Major depressive disorder, single episode, moderate: Secondary | ICD-10-CM

## 2022-12-26 DIAGNOSIS — E538 Deficiency of other specified B group vitamins: Secondary | ICD-10-CM

## 2022-12-26 DIAGNOSIS — M25562 Pain in left knee: Secondary | ICD-10-CM

## 2022-12-26 DIAGNOSIS — Z13220 Encounter for screening for lipoid disorders: Secondary | ICD-10-CM

## 2022-12-27 LAB — LIPID PANEL
Cholesterol: 182 mg/dL (ref <200)
HDL: 47 mg/dL — ABNORMAL LOW (ref >=50)
LDL Cholesterol (Calc): 108 mg/dL (calc) — ABNORMAL HIGH
Non-HDL Cholesterol (Calc): 135 mg/dL (calc) — ABNORMAL HIGH (ref <130)
Total CHOL/HDL Ratio: 3.9 (calc) (ref <5.0)
Triglycerides: 155 mg/dL — ABNORMAL HIGH (ref <150)

## 2022-12-27 LAB — COMPLETE METABOLIC PANEL WITH GFR
AG Ratio: 1.6 (calc) (ref 1.0–2.5)
ALT: 10 U/L (ref 6–29)
AST: 13 U/L (ref 10–35)
Albumin: 4 g/dL (ref 3.6–5.1)
Alkaline phosphatase (APISO): 74 U/L (ref 31–125)
BUN: 11 mg/dL (ref 7–25)
CO2: 27 mmol/L (ref 20–32)
Calcium: 9 mg/dL (ref 8.6–10.2)
Chloride: 105 mmol/L (ref 98–110)
Creat: 0.61 mg/dL (ref 0.50–0.99)
Globulin: 2.5 g/dL (calc) (ref 1.9–3.7)
Glucose, Bld: 92 mg/dL (ref 65–99)
Potassium: 3.9 mmol/L (ref 3.5–5.3)
Sodium: 140 mmol/L (ref 135–146)
Total Bilirubin: 0.4 mg/dL (ref 0.2–1.2)
Total Protein: 6.5 g/dL (ref 6.1–8.1)
eGFR: 112 mL/min/{1.73_m2} (ref >=60)

## 2022-12-27 LAB — CBC WITH DIFFERENTIAL/PLATELET
Absolute Monocytes: 632 cells/uL (ref 200–950)
Basophils Absolute: 62 cells/uL (ref 0–200)
Basophils Relative: 0.8 %
Eosinophils Absolute: 203 cells/uL (ref 15–500)
Eosinophils Relative: 2.6 %
HCT: 30.3 % — ABNORMAL LOW (ref 35.0–45.0)
Hemoglobin: 9.3 g/dL — ABNORMAL LOW (ref 11.7–15.5)
Lymphs Abs: 2176 cells/uL (ref 850–3900)
MCH: 22.4 pg — ABNORMAL LOW (ref 27.0–33.0)
MCHC: 30.7 g/dL — ABNORMAL LOW (ref 32.0–36.0)
MCV: 72.8 fL — ABNORMAL LOW (ref 80.0–100.0)
MPV: 12.1 fL (ref 7.5–12.5)
Monocytes Relative: 8.1 %
Neutro Abs: 4727 cells/uL (ref 1500–7800)
Neutrophils Relative %: 60.6 %
Platelets: 345 10*3/uL (ref 140–400)
RBC: 4.16 10*6/uL (ref 3.80–5.10)
RDW: 14.8 % (ref 11.0–15.0)
Total Lymphocyte: 27.9 %
WBC: 7.8 10*3/uL (ref 3.8–10.8)

## 2022-12-27 LAB — IRON,TIBC AND FERRITIN PANEL
%SAT: 5 % (calc) — ABNORMAL LOW (ref 16–45)
Ferritin: 3 ng/mL — ABNORMAL LOW (ref 16–232)
Iron: 20 ug/dL — ABNORMAL LOW (ref 40–190)
TIBC: 430 mcg/dL (calc) (ref 250–450)

## 2022-12-27 LAB — VITAMIN D 25 HYDROXY (VIT D DEFICIENCY, FRACTURES): Vit D, 25-Hydroxy: 13 ng/mL — ABNORMAL LOW (ref 30–100)

## 2022-12-27 LAB — B12 AND FOLATE PANEL
Folate: 14.5 ng/mL
Vitamin B-12: 277 pg/mL (ref 200–1100)

## 2022-12-29 ENCOUNTER — Other Ambulatory Visit: Payer: Self-pay | Admitting: Family Medicine

## 2022-12-29 DIAGNOSIS — D500 Iron deficiency anemia secondary to blood loss (chronic): Secondary | ICD-10-CM

## 2023-01-01 ENCOUNTER — Telehealth: Payer: Self-pay

## 2023-01-01 ENCOUNTER — Telehealth: Payer: Self-pay | Admitting: Family Medicine

## 2023-01-01 NOTE — Telephone Encounter (Signed)
Attempted to call patient for new patient introductory phone call. Patient did not answer and no voicemail.

## 2023-01-01 NOTE — Telephone Encounter (Signed)
Copied from [place]. Topic: General - Call Back - No Documentation >> Jan 01, 2023  2:41 PM Ja-Kwan M wrote: Reason for CRM: Pt stated she received a message to call the office in regards to an imaging appt. Pt requests call back. Cb# [phone]

## 2023-01-02 ENCOUNTER — Encounter: Payer: Self-pay | Admitting: Oncology

## 2023-01-02 ENCOUNTER — Inpatient Hospital Stay: Payer: BC Managed Care – PPO | Attending: Oncology | Admitting: Oncology

## 2023-01-02 ENCOUNTER — Inpatient Hospital Stay: Payer: BC Managed Care – PPO

## 2023-01-02 VITALS — BP 163/96 | HR 83 | Temp 98.5°F | Resp 18 | Wt 257.3 lb

## 2023-01-02 DIAGNOSIS — D508 Other iron deficiency anemias: Secondary | ICD-10-CM | POA: Diagnosis not present

## 2023-01-02 DIAGNOSIS — Z87891 Personal history of nicotine dependence: Secondary | ICD-10-CM | POA: Insufficient documentation

## 2023-01-02 DIAGNOSIS — R7989 Other specified abnormal findings of blood chemistry: Secondary | ICD-10-CM

## 2023-01-02 DIAGNOSIS — R5383 Other fatigue: Secondary | ICD-10-CM | POA: Insufficient documentation

## 2023-01-02 DIAGNOSIS — D509 Iron deficiency anemia, unspecified: Secondary | ICD-10-CM | POA: Insufficient documentation

## 2023-01-02 NOTE — Assessment & Plan Note (Signed)
Previous labs were reviewed and discussed with patient.  Consistent with iron deficiency anemia. Patient did not tolerate oral iron supplementation. Alternative option of proceed with IV Venofer treatments. I discussed about the potential risks including but not limited to allergic reactions/infusion reactions including anaphylactic reactions, phlebitis, high blood pressure, wheezing, SOB, skin rash, weight gain,dark urine, leg swelling, back pain, headache, nausea and fatigue, etc. Patient tolerates oral iron supplement poorly agrees with IV Venofer treatments.  Plan IV venofer weekly x 5

## 2023-01-02 NOTE — Assessment & Plan Note (Signed)
Recommend patient to take over-the-counter vitamin B12 supplementation 1000 mcg daily.

## 2023-01-02 NOTE — Progress Notes (Signed)
Hematology/Oncology Consult note [phone] [phone]      Patient Care Team: Steele Sizer, MD as PCP - General (Family Medicine) Steele Sizer, MD as Attending Physician (Family Medicine) Christene Lye, MD (General Surgery) Anabel Bene, MD as Consulting Physician (Neurology)   REFERRING PROVIDER: Steele Sizer, MD  CHIEF COMPLAINTS/REASON FOR VISIT:  Anemia  ASSESSMENT & PLAN:  Iron deficiency anemia Previous labs were reviewed and discussed with patient.  Consistent with iron deficiency anemia. Patient did not tolerate oral iron supplementation. Alternative option of proceed with IV Venofer treatments. I discussed about the potential risks including but not limited to allergic reactions/infusion reactions including anaphylactic reactions, phlebitis, high blood pressure, wheezing, SOB, skin rash, weight gain,dark urine, leg swelling, back pain, headache, nausea and fatigue, etc. Patient tolerates oral iron supplement poorly agrees with IV Venofer treatments.  Plan IV venofer weekly x 5   Low vitamin B12 level Recommend patient to take over-the-counter vitamin B12 supplementation 1000 mcg daily.  Orders Placed This Encounter  Procedures   CBC with Differential ([place] Only)    Standing Status:   Future    Standing Expiration Date:   01/03/2024   Iron and TIBC    Standing Status:   Future    Standing Expiration Date:   01/03/2024   Ferritin    Standing Status:   Future    Standing Expiration Date:   01/03/2024   Vitamin B12    Standing Status:   Future    Standing Expiration Date:   01/03/2024   Follow-up in 3 months All questions were answered. The patient knows to call the clinic with any problems, questions or concerns.  Earlie Server, MD, PhD [facility] Hematology Oncology 01/02/2023     HISTORY OF PRESENTING ILLNESS:  Sally Mueller is a  47 y.o.  female with PMH listed below who was referred to me for  anemia Reviewed patient's recent labs that was done.  Patient has chronic anemia since at least 2021.  08/26/2022 iron panel showed ferritin of 8, iron saturation 5, consistent with iron deficiency anemia. Patient has tried oral iron supplementation Sally Mueller is currently not taking.  Sally Mueller cannot tolerate due to GI side effects. 06/10/2022, colonoscopy showed normal examination. 06/10/2022, EGD showed nonbleeding gastric ulcer with a clean ulcer base.  Erosive gastropathy with stigmata of recent bleeding.  Biopsied. 08/26/2022, upper endoscopy showed nonbleeding gastric ulcer with a clean ulcer base. + Fatigue,  Sally Mueller denies recent chest pain on exertion, pre-syncopal episodes, or palpitations Sally Mueller had not noticed any recent bleeding such as epistaxis, hematuria or hematochezia.  Sally Mueller denies over the counter NSAID ingestion.  Patient is a Radio producer.    MEDICAL HISTORY:  Past Medical History:  Diagnosis Date   Anxiety    Headache    Metabolic syndrome    Migraine    Migraine without aura, with intractable migraine, so stated, with status migrainosus    Obesity    Seborrhea     SURGICAL HISTORY: Past Surgical History:  Procedure Laterality Date   CESAREAN SECTION  2000, 2002   CHOLECYSTECTOMY  2008?   COLONOSCOPY WITH PROPOFOL N/A 06/10/2022   Procedure: COLONOSCOPY WITH PROPOFOL;  Surgeon: Lin Landsman, MD;  Location: [place];  Service: Endoscopy;  Laterality: N/A;   ESOPHAGOGASTRODUODENOSCOPY (EGD) WITH PROPOFOL N/A 06/10/2022   Procedure: ESOPHAGOGASTRODUODENOSCOPY (EGD) WITH PROPOFOL WITH BIOPSIES;  Surgeon: Lin Landsman, MD;  Location: [place];  Service: Endoscopy;  Laterality: N/A;   ESOPHAGOGASTRODUODENOSCOPY (  EGD) WITH PROPOFOL N/A 08/26/2022   Procedure: ESOPHAGOGASTRODUODENOSCOPY (EGD) WITH PROPOFOL;  Surgeon: Lin Landsman, MD;  Location: [place];  Service: Endoscopy;  Laterality: N/A;   LIPOMA EXCISION  05/01/2014   TUBAL  LIGATION  2002   TUMOR REMOVAL  2012?    SOCIAL HISTORY: Social History   Socioeconomic History   Marital status: Soil scientist    Spouse name: Not on file   Number of children: 3   Years of education: Not on file   Highest education level: Bachelor's degree (e.g., BA, AB, BS)  Occupational History   Occupation: Pharmacist, hospital  Tobacco Use   Smoking status: Former    Years: 8.00    Types: Cigarettes    Start date: 11/17/1993    Quit date: 11/17/2001    Years since quitting: 21.1   Smokeless tobacco: Never  Vaping Use   Vaping Use: Never used  Substance and Sexual Activity   Alcohol use: No    Comment: socially   Drug use: No   Sexual activity: Yes    Partners: Male    Birth control/protection: Other-see comments    Comment: Tubal Ligation  Other Topics Concern   Not on file  Social History Narrative   Sally Mueller graduated from school of education   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 [employer] Fall of 2020   Social Determinants of Health   Financial Resource Strain: Medium Risk (02/23/2018)   Overall Financial Resource Strain (CARDIA)    Difficulty of Paying Living Expenses: Somewhat hard  Food Insecurity: No Food Insecurity (01/02/2023)   Hunger Vital Sign    Worried About Running Out of Food in the Last Year: Never true    Ran Out of Food in the Last Year: Never true  Transportation Needs: No Transportation Needs (01/02/2023)   PRAPARE - Hydrologist (Medical): No    Lack of Transportation (Non-Medical): No  Physical Activity: Inactive (02/23/2018)   Exercise Vital Sign    Days of Exercise per Week: 0 days    Minutes of Exercise per Session: 0 min  Stress: Not on file  Social Connections: Moderately Isolated (05/31/2018)   Social Connection and Isolation Panel [NHANES]    Frequency of Communication with Friends and Family: Once a week    Frequency of Social Gatherings with Friends and Family: Once a week     Attends Religious Services: Never    Marine scientist or Organizations: No    Attends Archivist Meetings: Never    Marital Status: Living with partner  Intimate Partner Violence: Not At Risk (01/02/2023)   Humiliation, Afraid, Rape, and Kick questionnaire    Fear of Current or Ex-Partner: No    Emotionally Abused: No    Physically Abused: No    Sexually Abused: No    FAMILY HISTORY: Family History  Problem Relation Age of Onset   Cardiomyopathy Mother    Hypertension Mother    Drug abuse Mother    Hyperlipidemia Father    Alcohol abuse Sister    Drug abuse Sister    Bipolar disorder Sister    Schizophrenia Sister    Cancer Maternal Aunt    Breast cancer Neg Hx     ALLERGIES:  is allergic to sumatriptan.  MEDICATIONS:  Current Outpatient Medications  Medication Sig Dispense Refill   busPIRone (BUSPAR) 10 MG tablet Take 10 mg by mouth 3 (three) times daily.  Iron-FA-B Cmp-C-Biot-Probiotic (FUSION PLUS) CAPS Take 1 capsule by mouth daily. 30 capsule 2   LORazepam (ATIVAN) 0.5 MG tablet Take 0.5 mg by mouth daily as needed.     omeprazole (PRILOSEC) 40 MG capsule Take 1 capsule (40 mg total) by mouth daily. 30 capsule 2   promethazine (PHENERGAN) 25 MG tablet Take 1 tablet (25 mg total) by mouth every 8 (eight) hours as needed. 30 tablet 0   sertraline (ZOLOFT) 100 MG tablet Take 200 mg by mouth daily.     Ubrogepant (UBRELVY) 100 MG TABS Take 1 tablet by mouth daily as needed. 16 tablet 1   valACYclovir (VALTREX) 500 MG tablet Take 1 tablet (500 mg total) by mouth daily. And twice daily on outbreaks (Patient not taking: Reported on 01/02/2023) 35 tablet 0   No current facility-administered medications for this visit.    Review of Systems  Constitutional:  Positive for fatigue. Negative for appetite change, chills and fever.  HENT:   Negative for hearing loss and voice change.   Eyes:  Negative for eye problems.  Respiratory:  Negative for chest  tightness and cough.   Cardiovascular:  Negative for chest pain.  Gastrointestinal:  Negative for abdominal distention, abdominal pain and blood in stool.  Endocrine: Negative for hot flashes.  Genitourinary:  Negative for difficulty urinating and frequency.   Musculoskeletal:  Negative for arthralgias.  Skin:  Negative for itching and rash.  Neurological:  Negative for extremity weakness.  Hematological:  Negative for adenopathy.  Psychiatric/Behavioral:  Negative for confusion.     PHYSICAL EXAMINATION:  Vitals:   01/02/23 0941  BP: (!) 163/96  Pulse: 83  Resp: 18  Temp: 98.5 F (36.9 C)   Filed Weights   01/02/23 0941  Weight: 257 lb 4.8 oz (116.7 kg)    Physical Exam Constitutional:      General: Sally Mueller is not in acute distress. HENT:     Head: Normocephalic and atraumatic.  Eyes:     General: No scleral icterus. Cardiovascular:     Rate and Rhythm: Normal rate and regular rhythm.     Heart sounds: Normal heart sounds.  Pulmonary:     Effort: Pulmonary effort is normal. No respiratory distress.     Breath sounds: No wheezing.  Abdominal:     General: Bowel sounds are normal. There is no distension.     Palpations: Abdomen is soft.  Musculoskeletal:        General: No deformity. Normal range of motion.     Cervical back: Normal range of motion and neck supple.  Skin:    General: Skin is warm and dry.     Findings: No erythema or rash.  Neurological:     Mental Status: Sally Mueller is alert and oriented to person, place, and time. Mental status is at baseline.     Cranial Nerves: No cranial nerve deficit.  Psychiatric:        Mood and Affect: Mood normal.      LABORATORY DATA:  I have reviewed the data as listed    Latest Ref Rng & Units 12/26/2022    3:45 PM 08/26/2022    8:47 AM 01/28/2022   10:44 AM  CBC  WBC 3.8 - 10.8 Thousand/uL 7.8  7.0  7.2   Hemoglobin 11.7 - 15.5 g/dL 9.3  10.9  10.8   Hematocrit 35.0 - 45.0 % 30.3  35.2  34.9   Platelets 140 - 400  Thousand/uL 345  282  325  Latest Ref Rng & Units 12/26/2022    3:45 PM 01/28/2022   10:44 AM 10/16/2020    4:15 PM  CMP  Glucose 65 - 99 mg/dL 92  89  99   BUN 7 - 25 mg/dL 11  14  14   $ Creatinine 0.50 - 0.99 mg/dL 0.61  0.66  0.64   Sodium 135 - 146 mmol/L 140  142  141   Potassium 3.5 - 5.3 mmol/L 3.9  4.2  4.5   Chloride 98 - 110 mmol/L 105  106  106   CO2 20 - 32 mmol/L 27  28  27   $ Calcium 8.6 - 10.2 mg/dL 9.0  9.2  9.5   Total Protein 6.1 - 8.1 g/dL 6.5  7.0  6.9   Total Bilirubin 0.2 - 1.2 mg/dL 0.4  0.6  0.6   AST 10 - 35 U/L 13  18  16   $ ALT 6 - 29 U/L 10  14  13       $ Component Value Date/Time   IRON 20 (L) 12/26/2022 1545   IRON 18 (L) 08/26/2022 0847   TIBC 430 12/26/2022 1545   TIBC 375 08/26/2022 0847   FERRITIN 3 (L) 12/26/2022 1545   FERRITIN 8 (L) 08/26/2022 0847   IRONPCTSAT 5 (L) 12/26/2022 1545     RADIOGRAPHIC STUDIES: I have personally reviewed the radiological images as listed and agreed with the findings in the report. No results found.

## 2023-01-02 NOTE — Addendum Note (Signed)
Addended by: Earlie Server on: 01/02/2023 09:13 PM   Modules accepted: Orders

## 2023-01-06 ENCOUNTER — Encounter: Payer: Self-pay | Admitting: Family Medicine

## 2023-01-07 ENCOUNTER — Inpatient Hospital Stay: Payer: BC Managed Care – PPO

## 2023-01-07 VITALS — BP 156/88 | HR 86 | Temp 99.1°F | Resp 18

## 2023-01-07 DIAGNOSIS — D500 Iron deficiency anemia secondary to blood loss (chronic): Secondary | ICD-10-CM

## 2023-01-07 DIAGNOSIS — D509 Iron deficiency anemia, unspecified: Secondary | ICD-10-CM | POA: Diagnosis not present

## 2023-01-07 MED ORDER — SODIUM CHLORIDE 0.9 % IV SOLN
200.0000 mg | Freq: Once | INTRAVENOUS | Status: AC
  Administered 2023-01-07: 200 mg via INTRAVENOUS
  Filled 2023-01-07: qty 200

## 2023-01-07 MED ORDER — SODIUM CHLORIDE 0.9 % IV SOLN
Freq: Once | INTRAVENOUS | Status: AC
  Filled 2023-01-07: qty 250

## 2023-01-09 ENCOUNTER — Ambulatory Visit: Payer: BC Managed Care – PPO | Admitting: Nurse Practitioner

## 2023-01-09 ENCOUNTER — Other Ambulatory Visit: Payer: Self-pay

## 2023-01-09 ENCOUNTER — Encounter: Payer: Self-pay | Admitting: Nurse Practitioner

## 2023-01-09 VITALS — BP 122/84 | HR 97 | Temp 98.1°F | Resp 16 | Ht 63.0 in | Wt 257.1 lb

## 2023-01-09 DIAGNOSIS — R350 Frequency of micturition: Secondary | ICD-10-CM | POA: Diagnosis not present

## 2023-01-09 LAB — POCT URINALYSIS DIPSTICK
Bilirubin, UA: NEGATIVE
Glucose, UA: NEGATIVE
Ketones, UA: NEGATIVE
Nitrite, UA: POSITIVE
Protein, UA: POSITIVE — AB
Spec Grav, UA: 1.02 (ref 1.010–1.025)
Urobilinogen, UA: 0.2 E.U./dL
pH, UA: 5 (ref 5.0–8.0)

## 2023-01-09 MED ORDER — CEPHALEXIN 500 MG PO CAPS: 500.0000 mg | ORAL_CAPSULE | Freq: Two times a day (BID) | ORAL | 0 refills | Status: AC

## 2023-01-09 MED ORDER — PHENAZOPYRIDINE HCL 100 MG PO TABS: 100.0000 mg | ORAL_TABLET | Freq: Three times a day (TID) | ORAL | 0 refills | Status: DC | PRN

## 2023-01-09 NOTE — Progress Notes (Signed)
BP 122/84   Pulse 97   Temp 98.1 F (36.7 C) (Oral)   Resp 16   Ht '5\' 3"'$  (1.6 m)   Wt 257 lb 1.6 oz (116.6 kg)   LMP 12/24/2022   SpO2 98%   BMI 45.54 kg/m    Subjective:    Patient ID: Sally Mueller, female    DOB: 10/03/1976, 47 y.o.   MRN: [id number]  HPI: Sally Mueller is a 47 y.o. female  Chief Complaint  Patient presents with   Urinary Frequency    For 5 days. Has taken AZO   Urinary frequency and urgency: Reports symptoms started 5 days ago.  Patient states she was taking Azo and it was helping a little bit but since she stopped taking it the symptoms ramped back up again.  Patient denies any fever.  Patient states she does have mild left side low back pain.  Discussed with patient that if she has worsening back pain or fever she will need to go to the emergency department.  Urine dip was positive for protein, leukocytes, blood, nitrates.  Send urine for culture.  Will start patient on Keflex, Pyridium recommend pushing fluids.   Relevant past medical, surgical, family and social history reviewed and updated as indicated. Interim medical history since our last visit reviewed. Allergies and medications reviewed and updated.  Review of Systems  Constitutional: Negative for fever or weight change.  Respiratory: Negative for cough and shortness of breath.   Cardiovascular: Negative for chest pain or palpitations.  Gastrointestinal: Negative for abdominal pain, no bowel changes.  GU: Urinary frequency and urgency. Musculoskeletal: Negative for gait problem or joint swelling.  Skin: Negative for rash.  Neurological: Negative for dizziness or headache.  No other specific complaints in a complete review of systems (except as listed in HPI above).      Objective:    BP 122/84   Pulse 97   Temp 98.1 F (36.7 C) (Oral)   Resp 16   Ht '5\' 3"'$  (1.6 m)   Wt 257 lb 1.6 oz (116.6 kg)   LMP 12/24/2022   SpO2 98%   BMI 45.54 kg/m   Wt Readings from Last 3 Encounters:   01/09/23 257 lb 1.6 oz (116.6 kg)  01/02/23 257 lb 4.8 oz (116.7 kg)  12/26/22 259 lb 1.6 oz (117.5 kg)    Physical Exam  Constitutional: Patient appears well-developed and well-nourished. Obese  No distress.  HEENT: head atraumatic, normocephalic, pupils equal and reactive to light, neck supple Cardiovascular: Normal rate, regular rhythm and normal heart sounds.  No murmur heard. No BLE edema. Pulmonary/Chest: Effort normal and breath sounds normal. No respiratory distress. Abdominal: Soft.  There is no tenderness. No CVA tenderness Psychiatric: Patient has a normal mood and affect. behavior is normal. Judgment and thought content normal.   Results for orders placed or performed in visit on 01/09/23  POCT urinalysis dipstick  Result Value Ref Range   Color, UA gold    Clarity, UA cloudy    Glucose, UA Negative Negative   Bilirubin, UA negative    Ketones, UA negative    Spec Grav, UA 1.020 1.010 - 1.025   Blood, UA large    pH, UA 5.0 5.0 - 8.0   Protein, UA Positive (A) Negative   Urobilinogen, UA 0.2 0.2 or 1.0 E.U./dL   Nitrite, UA positive    Leukocytes, UA Large (3+) (A) Negative   Appearance cloudy    Odor none  Assessment & Plan:   Problem List Items Addressed This Visit   None Visit Diagnoses     Urinary frequency    -  Primary   Sent for culture.  Start Keflex and Pyridium push fluids get rest.  If worsening symptoms seek emergency care.   Relevant Medications   cephALEXin (KEFLEX) 500 MG capsule   phenazopyridine (PYRIDIUM) 100 MG tablet   Other Relevant Orders   POCT urinalysis dipstick (Completed)   Urine Culture        Follow up plan: Return if symptoms worsen or fail to improve.

## 2023-01-11 LAB — URINE CULTURE
MICRO NUMBER:: 14608296
SPECIMEN QUALITY:: ADEQUATE

## 2023-01-14 ENCOUNTER — Inpatient Hospital Stay: Payer: BC Managed Care – PPO

## 2023-01-14 VITALS — BP 137/63 | HR 75 | Temp 97.2°F

## 2023-01-14 DIAGNOSIS — D509 Iron deficiency anemia, unspecified: Secondary | ICD-10-CM | POA: Diagnosis not present

## 2023-01-14 DIAGNOSIS — D500 Iron deficiency anemia secondary to blood loss (chronic): Secondary | ICD-10-CM

## 2023-01-14 MED ORDER — SODIUM CHLORIDE 0.9 % IV SOLN
200.0000 mg | Freq: Once | INTRAVENOUS | Status: AC
  Administered 2023-01-14: 200 mg via INTRAVENOUS
  Filled 2023-01-14: qty 200

## 2023-01-14 MED ORDER — SODIUM CHLORIDE 0.9% FLUSH
10.0000 mL | Freq: Once | INTRAVENOUS | Status: AC | PRN
  Administered 2023-01-14: 10 mL
  Filled 2023-01-14: qty 10

## 2023-01-14 MED ORDER — SODIUM CHLORIDE 0.9 % IV SOLN
Freq: Once | INTRAVENOUS | Status: AC
  Filled 2023-01-14: qty 250

## 2023-01-14 NOTE — Patient Instructions (Signed)

## 2023-01-14 NOTE — Progress Notes (Signed)
Patient tolerated Venofer infusion well, no questions/concerns voiced. Monitored 30 min post transfusion. Patient stable at discharge. VSS. AVS given.

## 2023-01-21 ENCOUNTER — Inpatient Hospital Stay: Payer: BC Managed Care – PPO | Attending: Oncology

## 2023-01-21 VITALS — BP 146/54 | HR 82 | Temp 97.6°F | Resp 18

## 2023-01-21 DIAGNOSIS — D509 Iron deficiency anemia, unspecified: Secondary | ICD-10-CM | POA: Diagnosis present

## 2023-01-21 DIAGNOSIS — D500 Iron deficiency anemia secondary to blood loss (chronic): Secondary | ICD-10-CM

## 2023-01-21 MED ORDER — SODIUM CHLORIDE 0.9 % IV SOLN
200.0000 mg | Freq: Once | INTRAVENOUS | Status: AC
  Administered 2023-01-21: 200 mg via INTRAVENOUS
  Filled 2023-01-21: qty 200

## 2023-01-21 MED ORDER — SODIUM CHLORIDE 0.9 % IV SOLN
Freq: Once | INTRAVENOUS | Status: AC
  Filled 2023-01-21: qty 250

## 2023-01-21 NOTE — Patient Instructions (Signed)

## 2023-01-28 ENCOUNTER — Inpatient Hospital Stay: Payer: BC Managed Care – PPO

## 2023-01-28 VITALS — BP 136/70 | HR 80 | Temp 99.1°F | Resp 18

## 2023-01-28 DIAGNOSIS — D500 Iron deficiency anemia secondary to blood loss (chronic): Secondary | ICD-10-CM

## 2023-01-28 DIAGNOSIS — D509 Iron deficiency anemia, unspecified: Secondary | ICD-10-CM | POA: Diagnosis not present

## 2023-01-28 MED ORDER — SODIUM CHLORIDE 0.9 % IV SOLN
200.0000 mg | Freq: Once | INTRAVENOUS | Status: AC
  Administered 2023-01-28: 200 mg via INTRAVENOUS
  Filled 2023-01-28: qty 10

## 2023-01-28 MED ORDER — SODIUM CHLORIDE 0.9 % IV SOLN
Freq: Once | INTRAVENOUS | Status: AC
  Filled 2023-01-28: qty 250

## 2023-01-28 NOTE — Patient Instructions (Signed)

## 2023-02-04 ENCOUNTER — Inpatient Hospital Stay: Payer: BC Managed Care – PPO

## 2023-02-04 VITALS — BP 144/75 | HR 76 | Temp 98.0°F

## 2023-02-04 DIAGNOSIS — D509 Iron deficiency anemia, unspecified: Secondary | ICD-10-CM | POA: Diagnosis not present

## 2023-02-04 DIAGNOSIS — D500 Iron deficiency anemia secondary to blood loss (chronic): Secondary | ICD-10-CM

## 2023-02-04 MED ORDER — SODIUM CHLORIDE 0.9 % IV SOLN
200.0000 mg | Freq: Once | INTRAVENOUS | Status: AC
  Administered 2023-02-04: 200 mg via INTRAVENOUS
  Filled 2023-02-04: qty 200

## 2023-02-04 MED ORDER — SODIUM CHLORIDE 0.9 % IV SOLN
Freq: Once | INTRAVENOUS | Status: AC
  Filled 2023-02-04: qty 250

## 2023-02-04 NOTE — Patient Instructions (Signed)

## 2023-03-27 ENCOUNTER — Inpatient Hospital Stay: Payer: BC Managed Care – PPO | Attending: Oncology

## 2023-03-27 DIAGNOSIS — D509 Iron deficiency anemia, unspecified: Secondary | ICD-10-CM | POA: Insufficient documentation

## 2023-03-27 DIAGNOSIS — E538 Deficiency of other specified B group vitamins: Secondary | ICD-10-CM | POA: Insufficient documentation

## 2023-03-27 DIAGNOSIS — D508 Other iron deficiency anemias: Secondary | ICD-10-CM

## 2023-03-27 LAB — IRON AND TIBC
Iron: 50 ug/dL (ref 28–170)
Saturation Ratios: 13 % (ref 10.4–31.8)
TIBC: 389 ug/dL (ref 250–450)
UIBC: 339 ug/dL

## 2023-03-27 LAB — CBC WITH DIFFERENTIAL (CANCER CENTER ONLY)
Abs Immature Granulocytes: 0.03 10*3/uL (ref 0.00–0.07)
Basophils Absolute: 0 10*3/uL (ref 0.0–0.1)
Basophils Relative: 1 %
Eosinophils Absolute: 0.1 10*3/uL (ref 0.0–0.5)
Eosinophils Relative: 1 %
HCT: 41 % (ref 36.0–46.0)
Hemoglobin: 13.2 g/dL (ref 12.0–15.0)
Immature Granulocytes: 1 %
Lymphocytes Relative: 24 %
Lymphs Abs: 1.6 10*3/uL (ref 0.7–4.0)
MCH: 26.2 pg (ref 26.0–34.0)
MCHC: 32.2 g/dL (ref 30.0–36.0)
MCV: 81.5 fL (ref 80.0–100.0)
Monocytes Absolute: 0.4 10*3/uL (ref 0.1–1.0)
Monocytes Relative: 6 %
Neutro Abs: 4.5 10*3/uL (ref 1.7–7.7)
Neutrophils Relative %: 67 %
Platelet Count: 252 10*3/uL (ref 150–400)
RBC: 5.03 MIL/uL (ref 3.87–5.11)
RDW: 16.4 % — ABNORMAL HIGH (ref 11.5–15.5)
WBC Count: 6.7 10*3/uL (ref 4.0–10.5)
nRBC: 0 % (ref 0.0–0.2)

## 2023-03-27 LAB — VITAMIN B12: Vitamin B-12: 175 pg/mL — ABNORMAL LOW (ref 180–914)

## 2023-03-27 LAB — FERRITIN: Ferritin: 22 ng/mL (ref 11–307)

## 2023-04-08 ENCOUNTER — Ambulatory Visit: Payer: BC Managed Care – PPO | Admitting: Oncology

## 2023-04-08 ENCOUNTER — Ambulatory Visit: Payer: BC Managed Care – PPO

## 2023-04-24 ENCOUNTER — Telehealth: Payer: Self-pay | Admitting: Oncology

## 2023-04-24 NOTE — Telephone Encounter (Signed)
Pt called to cancel her appts on 6/12. Pt stated she cannot financially afford to come to her appts right now.

## 2023-04-29 ENCOUNTER — Ambulatory Visit: Payer: BC Managed Care – PPO

## 2023-04-29 ENCOUNTER — Ambulatory Visit: Payer: BC Managed Care – PPO | Admitting: Oncology

## 2023-06-25 NOTE — Progress Notes (Signed)
Name: Sally Mueller   MRN: [id number]    DOB: Jan 01, 1976   Date:06/26/2023       Progress Note  Subjective  Chief Complaint  Follow Up  HPI  Migraine headaches: sometimes with aura , she stopped taking preventive medication. Last one was Atenolol.  She is only having about one episode per month , usually around her period but resolved with Bernita Raisin, she works as a Midwife. She takes medication about once a month and she has not missed any days at work lately , discussed taking before her cycle but not always predictable.   MDD/GAD: currently going to [facility] , she had genetic testing and is off Zoloft and is currently taking 50 mg of Pristiq. She has not noticed a significant change , she has an upcoming appointment with her psychiatrist. She is also taking Intuniv 2 mg at night - she states it has helped with her sleep onset, slows down her brain at night   Morbid obesity: BMI above 40, she states her depression has been worse, recently adjusted her medications, she states stressed at work and also with her son Whitney Post. She increased therapy during the Summer. Weight is down to 4 lbs since last visit . She is avoiding late night snacks, drinking more water and moving more   PUD: had EGD and colonoscopy, diagnosed with PUD and is taking omeprazole once a day, under the care of Dr. Allegra Lai, she states symptoms are sporadic now . She also has episodes of constipation and diarrhea and has discussed with Dr. Allegra Lai - seems to be worse when anxiety is high, discussed IBS   Vitamin D deficiency and B12 Deficiency: last B12 level was too low we will give her B12 injection today , also needs to start vitamin D supplementation   History of iron deficiency anemia: she is taking iron supplementation, ulcer has improved but still present when done 08/2022. 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   COLONOSCOPY WITH PROPOFOL N/A 06/10/2022   Procedure: COLONOSCOPY WITH PROPOFOL;  Surgeon: Toney Reil, MD;  Location[facility];  Service: Endoscopy;  Laterality: N/A;   ESOPHAGOGASTRODUODENOSCOPY (EGD) WITH PROPOFOL N/A 06/10/2022   Procedure: ESOPHAGOGASTRODUODENOSCOPY (EGD) WITH PROPOFOL WITH BIOPSIES;  Surgeon: Toney Reil, MD;  Location: [facility];  Service: Endoscopy;  Laterality: N/A;   ESOPHAGOGASTRODUODENOSCOPY (EGD) WITH PROPOFOL N/A 08/26/2022   Procedure: ESOPHAGOGASTRODUODENOSCOPY (EGD) WITH PROPOFOL;  Surgeon: Toney Reil, MD;  Location: [facility];  Service: Endoscopy;  Laterality: N/A;   LIPOMA EXCISION  05/01/2014   TUBAL LIGATION  2002   TUMOR REMOVAL  2012?    Family History  Problem Relation Age of Onset   Cardiomyopathy Mother  Hypertension Mother    Drug abuse Mother    Hyperlipidemia Father    Alcohol abuse Sister    Drug abuse Sister    Bipolar disorder Sister    Schizophrenia Sister    Cancer Maternal Aunt    Breast cancer Neg Hx     Social History   Tobacco Use   Smoking status: Former    Current packs/day: 0.00    Types: Cigarettes    Start date: 11/17/1993    Quit date: 11/17/2001    Years since quitting: 21.6   Smokeless tobacco: Never  Substance Use Topics   Alcohol use: No     Comment: socially     Current Outpatient Medications:    desvenlafaxine (PRISTIQ) 50 MG 24 hr tablet, Take 50 mg by mouth daily., Disp: , Rfl:    Iron-FA-B Cmp-C-Biot-Probiotic (FUSION PLUS) CAPS, Take 1 capsule by mouth daily., Disp: 30 capsule, Rfl: 2   LORazepam (ATIVAN) 0.5 MG tablet, Take 0.5 mg by mouth daily as needed., Disp: , Rfl:    omeprazole (PRILOSEC) 40 MG capsule, Take 1 capsule (40 mg total) by mouth daily., Disp: 30 capsule, Rfl: 2   promethazine (PHENERGAN) 25 MG tablet, Take 1 tablet (25 mg total) by mouth every 8 (eight) hours as needed., Disp: 30 tablet, Rfl: 0   Ubrogepant (UBRELVY) 100 MG TABS, Take 1 tablet by mouth daily as needed., Disp: 16 tablet, Rfl: 1   valACYclovir (VALTREX) 500 MG tablet, Take 1 tablet (500 mg total) by mouth daily. And twice daily on outbreaks, Disp: 35 tablet, Rfl: 0   busPIRone (BUSPAR) 10 MG tablet, Take 10 mg by mouth 3 (three) times daily. (Patient not taking: Reported on 06/26/2023), Disp: , Rfl:    phenazopyridine (PYRIDIUM) 100 MG tablet, Take 1 tablet (100 mg total) by mouth 3 (three) times daily as needed for pain. (Patient not taking: Reported on 06/26/2023), Disp: 10 tablet, Rfl: 0   sertraline (ZOLOFT) 100 MG tablet, Take 200 mg by mouth daily. (Patient not taking: Reported on 06/26/2023), Disp: , Rfl:   Allergies  Allergen Reactions   Sumatriptan     crawling sensation in her body, flushed    I personally reviewed active problem list, medication list, allergies, family history, social history, health maintenance with the patient/caregiver today.   ROS  Ten systems reviewed and is negative except as mentioned in HPI    Objective  Vitals:   06/26/23 1522  BP: 126/74  Pulse: 93  Resp: 16  SpO2: 98%  Weight: 253 lb (114.8 kg)  Height: 5\' 4"  (1.626 m)    Body mass index is 43.43 kg/m.  Physical Exam  Constitutional: Patient appears well-developed and well-nourished. Obese  No distress.  HEENT: head atraumatic,  normocephalic, pupils equal and reactive to light, neck supple Cardiovascular: Normal rate, regular rhythm and normal heart sounds.  No murmur heard. No BLE edema. Pulmonary/Chest: Effort normal and breath sounds normal. No respiratory distress. Abdominal: Soft.  There is no tenderness. Psychiatric: Patient has a normal mood and affect. behavior is normal. Judgment and thought content normal.   No results found for this or any previous visit (from the past 2160 hour(s)).   PHQ2/9:    06/26/2023    3:21 PM 01/09/2023   11:02 AM 01/02/2023    9:43 AM 12/26/2022    3:07 PM 12/03/2022    9:36 AM  Depression screen PHQ 2/9  Decreased Interest 0 0 1 1 0  Down, Depressed, Hopeless 1 0 1  1 0  PHQ - 2 Score 1 0 2 2 0  Altered sleeping 0   2   Tired, decreased energy 0   1   Change in appetite 0   0   Feeling bad or failure about yourself  0   0   Trouble concentrating 0   0   Moving slowly or fidgety/restless 0   0   Suicidal thoughts 0   0   PHQ-9 Score 1   5   Difficult doing work/chores    Somewhat difficult     phq 9 is negative   Fall Risk:    06/26/2023    3:21 PM 01/09/2023   10:59 AM 12/26/2022    3:06 PM 12/03/2022    9:35 AM 09/18/2022    9:22 AM  Fall Risk   Falls in the past year? 0 1 1 0 0  Number falls in past yr: 0 1 1 0 0  Injury with Fall? 0 0 0 0 0  Risk for fall due to : No Fall Risks History of fall(s) History of fall(s)    Follow up Falls prevention discussed  Falls prevention discussed;Education provided;Falls evaluation completed Falls evaluation completed       Functional Status Survey: Is the patient deaf or have difficulty hearing?: No Does the patient have difficulty seeing, even when wearing glasses/contacts?: No Does the patient have difficulty concentrating, remembering, or making decisions?: No Does the patient have difficulty walking or climbing stairs?: No Does the patient have difficulty dressing or bathing?: No Does the patient have difficulty  doing errands alone such as visiting a doctor's office or shopping?: No    Assessment & Plan  1. Moderate major depression (HCC)  Keep follow up with psychiatrist   2. Morbid obesity (HCC)  Discussed with the patient the risk posed by an increased BMI. Discussed importance of portion control, calorie counting and at least 150 minutes of physical activity weekly. Avoid sweet beverages and drink more water. Eat at least 6 servings of fruit and vegetables daily    3. B12 deficiency  - cyanocobalamin (VITAMIN B12) injection 1,000 mcg  4. Migraine with aura and without status migrainosus, not intractable  - promethazine (PHENERGAN) 25 MG tablet; Take 1 tablet (25 mg total) by mouth every 8 (eight) hours as needed.  Dispense: 30 tablet; Refill: 0 - Ubrogepant (UBRELVY) 100 MG TABS; Take 1 tablet (100 mg total) by mouth daily as needed.  Dispense: 16 tablet; Refill: 1  5. History of iron deficiency  Continue iron supplementation  6. Vitamin D deficiency  Must take otc supplementation  7. Anxiety, generalized

## 2023-06-26 ENCOUNTER — Ambulatory Visit: Payer: BC Managed Care – PPO | Admitting: Family Medicine

## 2023-06-26 ENCOUNTER — Encounter: Payer: Self-pay | Admitting: Family Medicine

## 2023-06-26 VITALS — BP 126/74 | HR 93 | Resp 16 | Ht 64.0 in | Wt 253.0 lb

## 2023-06-26 DIAGNOSIS — F321 Major depressive disorder, single episode, moderate: Secondary | ICD-10-CM

## 2023-06-26 DIAGNOSIS — E559 Vitamin D deficiency, unspecified: Secondary | ICD-10-CM

## 2023-06-26 DIAGNOSIS — E538 Deficiency of other specified B group vitamins: Secondary | ICD-10-CM

## 2023-06-26 DIAGNOSIS — Z8639 Personal history of other endocrine, nutritional and metabolic disease: Secondary | ICD-10-CM

## 2023-06-26 DIAGNOSIS — F411 Generalized anxiety disorder: Secondary | ICD-10-CM

## 2023-06-26 DIAGNOSIS — G43109 Migraine with aura, not intractable, without status migrainosus: Secondary | ICD-10-CM

## 2023-06-26 MED ORDER — CYANOCOBALAMIN 1000 MCG/ML IJ SOLN
1000.0000 ug | Freq: Once | INTRAMUSCULAR | Status: AC
Start: 2023-06-26 — End: 2023-06-26
  Administered 2023-06-26: 1000 ug via INTRAMUSCULAR

## 2023-06-26 MED ORDER — UBRELVY 100 MG PO TABS: 1.0000 | ORAL_TABLET | Freq: Every day | ORAL | 1 refills | Status: DC | PRN

## 2023-06-26 MED ORDER — PROMETHAZINE HCL 25 MG PO TABS
25.0000 mg | ORAL_TABLET | Freq: Three times a day (TID) | ORAL | 0 refills | Status: AC | PRN
Start: 2023-06-26 — End: ?

## 2023-07-27 ENCOUNTER — Ambulatory Visit (INDEPENDENT_AMBULATORY_CARE_PROVIDER_SITE_OTHER): Payer: BC Managed Care – PPO

## 2023-07-27 DIAGNOSIS — E538 Deficiency of other specified B group vitamins: Secondary | ICD-10-CM

## 2023-07-27 MED ORDER — CYANOCOBALAMIN 1000 MCG/ML IJ SOLN
1000.0000 ug | Freq: Once | INTRAMUSCULAR | Status: AC
Start: 2023-07-27 — End: 2023-07-27
  Administered 2023-07-27: 1000 ug via INTRAMUSCULAR

## 2023-09-21 ENCOUNTER — Ambulatory Visit: Payer: BC Managed Care – PPO | Admitting: Family Medicine

## 2023-09-21 ENCOUNTER — Encounter: Payer: Self-pay | Admitting: Family Medicine

## 2023-10-20 NOTE — Progress Notes (Signed)
Name: Sally Mueller   MRN: [id number]    DOB: 12-06-1975   Date:10/28/2023       Progress Note  Subjective  Chief Complaint  Chief Complaint  Patient presents with   Medical Management of Chronic Issues    HPI  Discussed the use of AI scribe software for clinical note transcription with the patient, who gave verbal consent to proceed.  History of Present Illness   The patient, with a history of major depressive disorder, generalized anxiety disorder, and morbid obesity, presents for a three-month follow-up. She reports a recent increase in anxiety symptoms, which have been managed by a therapist and a prescribing physician at [facility]. The patient's anxiety has been so severe that it has affected her appetite and motivation, leading to periods of not eating and excessive sleep. She also reports a decrease in interest in activities she usually enjoys, such as spending time with her grandchildren.  The patient's mental health providers are considering a change in medication, including the addition of hormonal therapy, due to suspected perimenopause. The patient's menstrual cycle has become irregular, which she believes is affecting her mood. Currently, the patient is on Pristiq, guanfacine, and lorazepam as needed.  The patient also reports occasional migraines, which are typically predictable and occur right before her menstrual cycle. She manages these migraines with Bernita Raisin as needed, which she reports as being highly effective.  In terms of physical health, the patient has a history of peptic ulcer disease and has been experiencing nausea recently. She has been managing this with over-the-counter Alka Seltzer approximately once a week. She has a history of  iron deficiency anemia due to peptic ulcer disease   The patient has been taking B12 sublingual drops and is due to restart vitamin D supplementation.  The patient's weight has remained stable, and she has not noticed any  significant weight gain or loss associated with her mood changes. She has considered weight loss medications in the past but has not pursued this recently.  In summary, the patient is currently struggling with increased anxiety symptoms, which are impacting her daily life and activities. She is in the process of adjusting her medication regimen with her mental health providers to better manage these symptoms. She also has ongoing physical health concerns, including nausea potentially related to her peptic ulcer disease, and is due to restart vitamin D supplementation and consider restarting omeprazole.         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   COLONOSCOPY WITH PROPOFOL N/A 06/10/2022   Procedure: COLONOSCOPY WITH PROPOFOL;  Surgeon: Toney Reil, MD;  Location: [facility];  Service: Endoscopy;  Laterality: N/A;   ESOPHAGOGASTRODUODENOSCOPY (EGD) WITH PROPOFOL N/A 06/10/2022   Procedure: ESOPHAGOGASTRODUODENOSCOPY (EGD) WITH PROPOFOL WITH BIOPSIES;  Surgeon:  Toney Reil, MD;  Location: [facility];  Service: Endoscopy;  Laterality: N/A;   ESOPHAGOGASTRODUODENOSCOPY (EGD) WITH PROPOFOL N/A 08/26/2022   Procedure: ESOPHAGOGASTRODUODENOSCOPY (EGD) WITH PROPOFOL;  Surgeon:  Toney Reil, MD;  Location: [facility];  Service: Endoscopy;  Laterality: N/A;   LIPOMA EXCISION  05/01/2014   TUBAL LIGATION  2002   TUMOR REMOVAL  2012?    Family History  Problem Relation Age of Onset   Cardiomyopathy Mother    Hypertension Mother    Drug abuse Mother    Hyperlipidemia Father    Alcohol abuse Sister    Drug abuse Sister    Bipolar disorder Sister    Schizophrenia Sister    Cancer Maternal Aunt    Breast cancer Neg Hx     Social History   Tobacco Use   Smoking status: Former    Current packs/day: 0.00    Types: Cigarettes    Start date: 11/17/1993    Quit date: 11/17/2001    Years since quitting: 21.9   Smokeless tobacco: Never  Substance Use Topics   Alcohol use: No    Comment: socially     Current Outpatient Medications:    desvenlafaxine (PRISTIQ) 50 MG 24 hr tablet, Take 50 mg by mouth daily., Disp: , Rfl:    guanFACINE (INTUNIV) 2 MG TB24 ER tablet, Take 2 mg by mouth daily., Disp: , Rfl:    hydrOXYzine (ATARAX) 10 MG tablet, Take 10 mg by mouth 2 (two) times daily as needed., Disp: , Rfl:    LORazepam (ATIVAN) 0.5 MG tablet, Take 0.5 mg by mouth daily as needed., Disp: , Rfl:    progesterone (PROMETRIUM) 100 MG capsule, Take 100 mg by mouth daily. Third week of cycle, Disp: , Rfl:    promethazine (PHENERGAN) 25 MG tablet, Take 1 tablet (25 mg total) by mouth every 8 (eight) hours as needed., Disp: 30 tablet, Rfl: 0   Ubrogepant (UBRELVY) 100 MG TABS, Take 1 tablet (100 mg total) by mouth daily as needed., Disp: 16 tablet, Rfl: 1   valACYclovir (VALTREX) 500 MG tablet, Take 1 tablet (500 mg total) by mouth daily. And twice daily on outbreaks, Disp: 35 tablet, Rfl: 0   cholecalciferol (VITAMIN D3) 25 MCG (1000 UNIT) tablet, Take 1,000 Units by mouth daily. (Patient not taking: Reported on 10/28/2023), Disp: , Rfl:    omeprazole (PRILOSEC) 20 MG capsule, Take 1 capsule (20 mg total) by mouth daily., Disp: 90 capsule, Rfl: 0  Current  Facility-Administered Medications:    cyanocobalamin (VITAMIN B12) injection 1,000 mcg, 1,000 mcg, Intramuscular, Once,   Allergies  Allergen Reactions   Sumatriptan     crawling sensation in her body, flushed    I personally reviewed active problem list, medication list, allergies, family history with the patient/caregiver today.   ROS  Ten systems reviewed and is negative except as mentioned in HPI    Objective  Vitals:   10/28/23 1503  BP: 130/76  Pulse: 99  Resp: 16  Temp: 98.1 F (36.7 C)  TempSrc: Oral  SpO2: 100%  Weight: 258 lb 9.6 oz (117.3 kg)  Height: 5\' 4"  (1.626 m)    Body mass index is 44.39 kg/m.  Physical Exam  Constitutional: Patient appears well-developed and well-nourished. Obese  No distress.  HEENT: head atraumatic, normocephalic, pupils equal and reactive to light, neck supple Cardiovascular: Normal rate, regular rhythm and normal heart sounds.  No murmur heard. No BLE edema. Pulmonary/Chest: Effort normal and breath sounds  normal. No respiratory distress. Abdominal: Soft.  There is no tenderness. Psychiatric: Patient has a normal mood and affect. behavior is normal. Judgment and thought content normal.   PHQ2/9:    10/28/2023    3:10 PM 06/26/2023    3:21 PM 01/09/2023   11:02 AM 01/02/2023    9:43 AM 12/26/2022    3:07 PM  Depression screen PHQ 2/9  Decreased Interest 1 0 0 1 1  Down, Depressed, Hopeless 1 1 0 1 1  PHQ - 2 Score 2 1 0 2 2  Altered sleeping 3 0   2  Tired, decreased energy 1 0   1  Change in appetite 1 0   0  Feeling bad or failure about yourself  0 0   0  Trouble concentrating 1 0   0  Moving slowly or fidgety/restless 0 0   0  Suicidal thoughts 0 0   0  PHQ-9 Score 8 1   5   Difficult doing work/chores Somewhat difficult    Somewhat difficult    phq 9 is positive   Fall Risk:    10/28/2023    3:03 PM 06/26/2023    3:21 PM 01/09/2023   10:59 AM 12/26/2022    3:06 PM 12/03/2022    9:35 AM  Fall Risk   Falls in the  past year? 0 0 1 1 0  Number falls in past yr: 0 0 1 1 0  Injury with Fall? 0 0 0 0 0  Risk for fall due to : No Fall Risks No Fall Risks History of fall(s) History of fall(s)   Follow up Falls prevention discussed;Education provided;Falls evaluation completed Falls prevention discussed  Falls prevention discussed;Education provided;Falls evaluation completed Falls evaluation completed     Assessment & Plan  Assessment and Plan    Major Depressive Disorder and Generalized Anxiety Disorder Experiencing increased anxiety and depressive symptoms. Currently managed by [facility] with a plan to transition from Pristiq to Prozac and add progesterone and hydroxyzine. Patient also on guanfacine 2mg  ER nightly and lorazepam 1mg  as needed. -Continue with psychiatric management and therapy. -Encourage patient to communicate with psychiatric provider if symptoms worsen or new symptoms develop.  Perimenopause Irregular periods and mood changes likely related to perimenopause. Plan to start progesterone 100mg  at bedtime during the third week of menstrual cycle. -Continue with psychiatric management and monitor for changes in mood and menstrual cycle.  Peptic Ulcer Disease History of peptic ulcer disease with recent nausea. Patient has been using Catering manager for symptom management. -Start omeprazole 20mg  for nausea and potential ulcer flare-up.  Iron Deficiency Anemia History of iron deficiency anemia related to peptic ulcer disease. Recent labs showed improvement. -Continue iron supplementation and monitor for symptoms of anemia.  Vitamin D and B12 Deficiency History of vitamin D and B12 deficiency. Patient has B12 sublingual drops and was advised to restart vitamin D supplementation. -Administer B12 injection today. -Advise patient to restart vitamin D supplementation.  Morbid Obesity Stable weight. Patient has tried weight loss medications in the past but is not currently interested  in pursuing this treatment. -Encourage healthy lifestyle habits.  General Health Maintenance -Administer tetanus vaccine today. -Schedule Pap smear for cervical cancer screening (due October 2024). -Schedule follow-up in 6 months or sooner if physical symptoms worsen.

## 2023-10-28 ENCOUNTER — Ambulatory Visit: Payer: BC Managed Care – PPO | Admitting: Family Medicine

## 2023-10-28 ENCOUNTER — Encounter: Payer: Self-pay | Admitting: Family Medicine

## 2023-10-28 VITALS — BP 130/76 | HR 99 | Temp 98.1°F | Resp 16 | Ht 64.0 in | Wt 258.6 lb

## 2023-10-28 DIAGNOSIS — F321 Major depressive disorder, single episode, moderate: Secondary | ICD-10-CM

## 2023-10-28 DIAGNOSIS — Z23 Encounter for immunization: Secondary | ICD-10-CM

## 2023-10-28 DIAGNOSIS — K279 Peptic ulcer, site unspecified, unspecified as acute or chronic, without hemorrhage or perforation: Secondary | ICD-10-CM

## 2023-10-28 DIAGNOSIS — E559 Vitamin D deficiency, unspecified: Secondary | ICD-10-CM

## 2023-10-28 DIAGNOSIS — F411 Generalized anxiety disorder: Secondary | ICD-10-CM

## 2023-10-28 DIAGNOSIS — Z8639 Personal history of other endocrine, nutritional and metabolic disease: Secondary | ICD-10-CM

## 2023-10-28 DIAGNOSIS — G43109 Migraine with aura, not intractable, without status migrainosus: Secondary | ICD-10-CM | POA: Diagnosis not present

## 2023-10-28 DIAGNOSIS — E538 Deficiency of other specified B group vitamins: Secondary | ICD-10-CM

## 2023-10-28 MED ORDER — CYANOCOBALAMIN 1000 MCG/ML IJ SOLN
1000.0000 ug | Freq: Once | INTRAMUSCULAR | Status: AC
  Administered 2023-10-28: 1000 ug via INTRAMUSCULAR

## 2023-10-28 MED ORDER — OMEPRAZOLE 20 MG PO CPDR: 20.0000 mg | DELAYED_RELEASE_CAPSULE | Freq: Every day | ORAL | 0 refills | Status: DC

## 2023-11-16 ENCOUNTER — Encounter: Payer: Self-pay | Admitting: Oncology

## 2023-11-25 ENCOUNTER — Ambulatory Visit (INDEPENDENT_AMBULATORY_CARE_PROVIDER_SITE_OTHER): Payer: 59

## 2023-11-25 ENCOUNTER — Encounter: Payer: Self-pay | Admitting: Oncology

## 2023-11-25 DIAGNOSIS — E538 Deficiency of other specified B group vitamins: Secondary | ICD-10-CM | POA: Diagnosis not present

## 2023-11-25 MED ORDER — CYANOCOBALAMIN 1000 MCG/ML IJ SOLN
1000.0000 ug | Freq: Once | INTRAMUSCULAR | Status: AC
  Administered 2023-11-25: 1000 ug via INTRAMUSCULAR

## 2023-11-25 NOTE — Progress Notes (Signed)
 Patient is in office today for a nurse visit for B12 Injection. Patient Injection was given in the  Left deltoid. Patient tolerated injection well.

## 2023-11-27 ENCOUNTER — Encounter: Payer: BC Managed Care – PPO | Admitting: Family Medicine

## 2023-12-14 ENCOUNTER — Encounter: Payer: Self-pay | Admitting: Family Medicine

## 2023-12-15 ENCOUNTER — Encounter: Payer: 59 | Admitting: Family Medicine

## 2023-12-22 ENCOUNTER — Other Ambulatory Visit: Payer: Self-pay | Admitting: Family Medicine

## 2023-12-22 DIAGNOSIS — M25362 Other instability, left knee: Secondary | ICD-10-CM

## 2023-12-22 DIAGNOSIS — M25562 Pain in left knee: Secondary | ICD-10-CM

## 2023-12-23 ENCOUNTER — Ambulatory Visit: Payer: 59

## 2023-12-23 ENCOUNTER — Encounter: Payer: Self-pay | Admitting: Family Medicine

## 2024-01-20 ENCOUNTER — Encounter: Payer: Self-pay | Admitting: Family Medicine

## 2024-01-20 ENCOUNTER — Other Ambulatory Visit: Payer: Self-pay | Admitting: Family Medicine

## 2024-01-20 ENCOUNTER — Telehealth (INDEPENDENT_AMBULATORY_CARE_PROVIDER_SITE_OTHER): Payer: Self-pay | Admitting: Family Medicine

## 2024-01-20 DIAGNOSIS — R110 Nausea: Secondary | ICD-10-CM | POA: Diagnosis not present

## 2024-01-20 DIAGNOSIS — R194 Change in bowel habit: Secondary | ICD-10-CM | POA: Diagnosis not present

## 2024-01-20 DIAGNOSIS — R1013 Epigastric pain: Secondary | ICD-10-CM

## 2024-01-20 DIAGNOSIS — R1084 Generalized abdominal pain: Secondary | ICD-10-CM

## 2024-01-20 MED ORDER — OMEPRAZOLE 40 MG PO CPDR
40.0000 mg | DELAYED_RELEASE_CAPSULE | Freq: Every day | ORAL | 0 refills | Status: AC
Start: 2024-01-20 — End: ?

## 2024-01-20 MED ORDER — ONDANSETRON HCL 4 MG PO TABS: 4.0000 mg | ORAL_TABLET | Freq: Three times a day (TID) | ORAL | 0 refills | Status: DC | PRN

## 2024-01-20 NOTE — Progress Notes (Signed)
 Name: Sally Mueller   MRN: [id number]    DOB: 1975-12-30   Date:01/20/2024       Progress Note  Subjective  Chief Complaint  Chief Complaint  Patient presents with   Nausea   Diarrhea   Abdominal Pain    Epigastric area, sx started around Thursday of last week   Bloated   Constipation    At the beginning    I connected with  Lyla Son  on 01/20/24 at  3:40 PM EST by a video enabled telemedicine application and verified that I am speaking with the correct person using two identifiers.  I discussed the limitations of evaluation and management by telemedicine and the availability of in person appointments. The patient expressed understanding and agreed to proceed with the virtual visit  Staff also discussed with the patient that there may be a patient responsible charge related to this service. Patient Location: at work  Provider Location: [facility] Additional Individuals present: alone   Discussed the use of AI scribe software for clinical note transcription with the patient, who gave verbal consent to proceed.  History of Present Illness   Sally Mueller is a 48 year old female with a history of  IBS who presents with  abdominal symptoms.  Since Thursday, she has experienced epigastric pain which progressively worsened over the weekend. Initially, she had bloating, an epigastric burning sensation, and severe nausea, leading to a reduced ability to eat. These symptoms persisted through Friday, Saturday, and Sunday, causing her to leave work on Friday due to the severity. By Monday, her symptoms shifted from constipation to diarrhea, accompanied by cramps and persistent nausea. She continues to struggle with eating due to stomach pain. No vomiting, but she feels extremely nauseous, flushed, and queasy, significantly impacting her ability to concentrate at work. Pain is localized primarily to the epigastric area, with radiation to sides of upper abdomen  and also low abdominal  cramping associated  with bowel movements.  She has been taking omeprazole 20 mg daily without relief. She confirms a history of H. pylori testing, which was negative. No history of pancreatitis and has a history of cholecystectomy  She reports a recent family-wide flu infection, which resolved before her current symptoms began. During the review of symptoms, she confirms nausea, abdominal pain, constipation followed by diarrhea, and bloating. No fever or vomiting.       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   COLONOSCOPY WITH PROPOFOL N/A 06/10/2022   Procedure: COLONOSCOPY WITH PROPOFOL;  Surgeon: Toney Reil, MD;  Location: [facility];  Service: Endoscopy;  Laterality: N/A;   ESOPHAGOGASTRODUODENOSCOPY (EGD) WITH PROPOFOL N/A 06/10/2022   Procedure: ESOPHAGOGASTRODUODENOSCOPY (EGD) WITH PROPOFOL WITH BIOPSIES;  Surgeon: Toney Reil, MD;  Location: [facility];  Service: Endoscopy;  Laterality: N/A;   ESOPHAGOGASTRODUODENOSCOPY (EGD) WITH PROPOFOL N/A 08/26/2022   Procedure: ESOPHAGOGASTRODUODENOSCOPY (EGD) WITH PROPOFOL;  Surgeon: Toney Reil, MD;  Location: [facility];  Service: Endoscopy;  Laterality: N/A;   LIPOMA EXCISION  05/01/2014   TUBAL LIGATION  2002   TUMOR  REMOVAL  2012?    Family History  Problem Relation Age of Onset   Cardiomyopathy Mother    Hypertension Mother    Drug abuse Mother    Hyperlipidemia Father    Alcohol abuse Sister    Drug abuse Sister    Bipolar disorder Sister    Schizophrenia Sister    Cancer Maternal Aunt    Breast cancer Neg Hx     Social History   Socioeconomic History   Marital status: Media planner    Spouse name: Not on file   Number of children: 3   Years of education: Not on file   Highest education level: Bachelor's degree (e.g., BA, AB, BS)  Occupational History   Occupation: Runner, broadcasting/film/video  Tobacco Use   Smoking status: Former    Current packs/day: 0.00    Types: Cigarettes    Start date: 11/17/1993    Quit date: 11/17/2001    Years since quitting: 22.1   Smokeless tobacco: Never  Vaping Use   Vaping status: Never Used  Substance and Sexual Activity   Alcohol use: No    Comment: socially   Drug use: No   Sexual activity: Yes    Partners: Male    Birth control/protection: Other-see comments    Comment: Tubal Ligation  Other Topics Concern   Not on file  Social History Narrative   She graduated from school of education   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 [employer] [place]   Social Drivers of Health   Financial Resource Strain: Medium Risk (02/23/2018)   Overall Financial Resource Strain (CARDIA)    Difficulty of Paying Living Expenses: Somewhat hard  Food Insecurity: No Food Insecurity (01/02/2023)   Hunger Vital Sign    Worried About Running Out of Food in the Last Year: Never true    Ran Out of Food in the Last Year: Never true  Transportation Needs: No Transportation Needs (01/02/2023)   PRAPARE - Administrator, Civil Service (Medical): No    Lack of Transportation (Non-Medical): No  Physical Activity: Inactive (02/23/2018)   Exercise Vital Sign    Days of Exercise per Week: 0 days    Minutes of Exercise per Session: 0  min  Stress: Not on file  Social Connections: Moderately Isolated (05/31/2018)   Social Connection and Isolation Panel [NHANES]    Frequency of Communication with Friends and Family: Once a week    Frequency of Social Gatherings with Friends and Family: Once a week    Attends Religious Services: Never    Database administrator or Organizations: No    Attends Banker Meetings: Never    Marital Status: Living with partner  Intimate Partner Violence: Not At Risk (01/02/2023)   Humiliation, Afraid, Rape, and Kick questionnaire    Fear of Current or Ex-Partner: No    Emotionally Abused: No    Physically Abused: No    Sexually Abused: No     Current Outpatient Medications:    desvenlafaxine (PRISTIQ) 50 MG 24 hr tablet, Take 50 mg by mouth daily., Disp: , Rfl:    FLUoxetine (PROZAC) 40  MG capsule, Take 40 mg by mouth every morning., Disp: , Rfl:    guanFACINE (INTUNIV) 2 MG TB24 ER tablet, Take 2 mg by mouth daily., Disp: , Rfl:    hydrOXYzine (ATARAX) 10 MG tablet, Take 10 mg by mouth 2 (two) times daily as needed., Disp: , Rfl:    LORazepam (ATIVAN) 0.5 MG tablet, Take 0.5 mg by mouth daily as needed., Disp: , Rfl:    omeprazole (PRILOSEC) 20 MG capsule, Take 1 capsule (20 mg total) by mouth daily., Disp: 90 capsule, Rfl: 0   progesterone (PROMETRIUM) 100 MG capsule, Take 100 mg by mouth daily. Third week of cycle, Disp: , Rfl:    promethazine (PHENERGAN) 25 MG tablet, Take 1 tablet (25 mg total) by mouth every 8 (eight) hours as needed., Disp: 30 tablet, Rfl: 0   Ubrogepant (UBRELVY) 100 MG TABS, Take 1 tablet (100 mg total) by mouth daily as needed., Disp: 16 tablet, Rfl: 1   valACYclovir (VALTREX) 500 MG tablet, Take 1 tablet (500 mg total) by mouth daily. And twice daily on outbreaks, Disp: 35 tablet, Rfl: 0   cholecalciferol (VITAMIN D3) 25 MCG (1000 UNIT) tablet, Take 1,000 Units by mouth daily. (Patient not taking: Reported on 01/20/2024), Disp: , Rfl:   Allergies   Allergen Reactions   Sumatriptan     crawling sensation in her body, flushed    I personally reviewed active problem list, medication list, allergies, family history with the patient/caregiver today.   ROS  Ten systems reviewed and is negative except as mentioned in HPI    Objective  Virtual encounter, vitals not obtained.  There is no height or weight on file to calculate BMI.  Physical Exam   PHQ2/9:    01/20/2024    3:33 PM 10/28/2023    3:10 PM 06/26/2023    3:21 PM 01/09/2023   11:02 AM 01/02/2023    9:43 AM  Depression screen PHQ 2/9  Decreased Interest 3 1 0 0 1  Down, Depressed, Hopeless 3 1 1  0 1  PHQ - 2 Score 6 2 1  0 2  Altered sleeping 3 3 0    Tired, decreased energy 3 1 0    Change in appetite 3 1 0    Feeling bad or failure about yourself  1 0 0    Trouble concentrating 1 1 0    Moving slowly or fidgety/restless 0 0 0    Suicidal thoughts 0 0 0    PHQ-9 Score 17 8 1     Difficult doing work/chores Very difficult Somewhat difficult      PHQ-2/9 Result is positive.    Fall Risk:    10/28/2023    3:03 PM 06/26/2023    3:21 PM 01/09/2023   10:59 AM 12/26/2022    3:06 PM 12/03/2022    9:35 AM  Fall Risk   Falls in the past year? 0 0 1 1 0  Number falls in past yr: 0 0 1 1 0  Injury with Fall? 0 0 0 0 0  Risk for fall due to : No Fall Risks No Fall Risks History of fall(s) History of fall(s)   Follow up Falls prevention discussed;Education provided;Falls evaluation completed Falls prevention discussed  Falls prevention discussed;Education provided;Falls evaluation completed Falls evaluation completed     Assessment and Plan    Generalized abdominal pain/Dyspesia/nausea  Symptoms include bloating, burning, nausea, constipation, diarrhea, and abdominal cramps. No fever or vomiting. Pain localized to the epigastric area and lower  abdomen. Omeprazole 20mg  daily has not been effective. -Order comprehensive blood panel, CBC, lipase, and H. pylori  test. -Increase Omeprazole to 40mg  daily. -Prescribe Zofran for nausea. -Advise bland diet and hydration. -Advise patient to seek emergency care if symptoms worsen.  Vitamin B12 Deficiency Patient due for B12 injection. -Administer B12 injection during lab visit. she plans on coming in tomorrow to have labs drawn       There are no diagnoses linked to this encounter.  I discussed the assessment and treatment plan with the patient. The patient was provided an opportunity to ask questions and all were answered. The patient agreed with the plan and demonstrated an understanding of the instructions.  The patient was advised to call back or seek an in-person evaluation if the symptoms worsen or if the condition fails to improve as anticipated.  I provided 25  minutes of non-face-to-face time during this encounter.

## 2024-01-21 ENCOUNTER — Ambulatory Visit (INDEPENDENT_AMBULATORY_CARE_PROVIDER_SITE_OTHER): Payer: Self-pay

## 2024-01-21 DIAGNOSIS — E538 Deficiency of other specified B group vitamins: Secondary | ICD-10-CM

## 2024-01-21 MED ORDER — CYANOCOBALAMIN 1000 MCG/ML IJ SOLN
1000.0000 ug | Freq: Once | INTRAMUSCULAR | Status: AC
  Administered 2024-01-21: 1000 ug via INTRAMUSCULAR

## 2024-01-21 NOTE — Progress Notes (Signed)
 Patient is in office today for a nurse visit for B12 Injection. Patient Injection was given in the  Left deltoid. Patient tolerated injection well.

## 2024-01-22 LAB — CBC WITH DIFFERENTIAL/PLATELET
Absolute Lymphocytes: 1370 {cells}/uL (ref 850–3900)
Absolute Monocytes: 378 {cells}/uL (ref 200–950)
Basophils Absolute: 50 {cells}/uL (ref 0–200)
Basophils Relative: 0.8 %
Eosinophils Absolute: 81 {cells}/uL (ref 15–500)
Eosinophils Relative: 1.3 %
HCT: 38.6 % (ref 35.0–45.0)
Hemoglobin: 12 g/dL (ref 11.7–15.5)
MCH: 25.5 pg — ABNORMAL LOW (ref 27.0–33.0)
MCHC: 31.1 g/dL — ABNORMAL LOW (ref 32.0–36.0)
MCV: 82 fL (ref 80.0–100.0)
MPV: 12 fL (ref 7.5–12.5)
Monocytes Relative: 6.1 %
Neutro Abs: 4321 {cells}/uL (ref 1500–7800)
Neutrophils Relative %: 69.7 %
Platelets: 307 10*3/uL (ref 140–400)
RBC: 4.71 10*6/uL (ref 3.80–5.10)
RDW: 13.2 % (ref 11.0–15.0)
Total Lymphocyte: 22.1 %
WBC: 6.2 10*3/uL (ref 3.8–10.8)

## 2024-01-22 LAB — COMPLETE METABOLIC PANEL WITH GFR
AG Ratio: 1.6 (calc) (ref 1.0–2.5)
ALT: 13 U/L (ref 6–29)
AST: 16 U/L (ref 10–35)
Albumin: 3.9 g/dL (ref 3.6–5.1)
Alkaline phosphatase (APISO): 75 U/L (ref 31–125)
BUN: 11 mg/dL (ref 7–25)
CO2: 27 mmol/L (ref 20–32)
Calcium: 9.1 mg/dL (ref 8.6–10.2)
Chloride: 103 mmol/L (ref 98–110)
Creat: 0.7 mg/dL (ref 0.50–0.99)
Globulin: 2.5 g/dL (ref 1.9–3.7)
Glucose, Bld: 120 mg/dL — ABNORMAL HIGH (ref 65–99)
Potassium: 3.8 mmol/L (ref 3.5–5.3)
Sodium: 138 mmol/L (ref 135–146)
Total Bilirubin: 1.1 mg/dL (ref 0.2–1.2)
Total Protein: 6.4 g/dL (ref 6.1–8.1)
eGFR: 107 mL/min/{1.73_m2} (ref >=60)

## 2024-01-22 LAB — LIPASE: Lipase: 29 U/L (ref 7–60)

## 2024-02-09 ENCOUNTER — Encounter: Payer: Self-pay | Admitting: Family Medicine

## 2024-02-18 ENCOUNTER — Telehealth: Payer: Self-pay

## 2024-02-18 NOTE — Telephone Encounter (Signed)
 Cover My meds prior auth for   Ubrogepant (UBRELVY) 100 MG TABS   Key [id number]

## 2024-02-18 NOTE — Telephone Encounter (Signed)
 PA done through cover my meds waiting on insurance to determine

## 2024-03-07 ENCOUNTER — Encounter: Payer: Self-pay | Admitting: Family Medicine

## 2024-03-23 ENCOUNTER — Encounter: Payer: Self-pay | Admitting: Family Medicine

## 2024-03-23 ENCOUNTER — Ambulatory Visit: Admitting: Family Medicine

## 2024-03-23 VITALS — BP 136/82 | HR 95 | Resp 16 | Ht 64.0 in | Wt 243.0 lb

## 2024-03-23 DIAGNOSIS — F411 Generalized anxiety disorder: Secondary | ICD-10-CM | POA: Diagnosis not present

## 2024-03-23 DIAGNOSIS — E538 Deficiency of other specified B group vitamins: Secondary | ICD-10-CM | POA: Diagnosis not present

## 2024-03-23 DIAGNOSIS — F321 Major depressive disorder, single episode, moderate: Secondary | ICD-10-CM | POA: Diagnosis not present

## 2024-03-23 DIAGNOSIS — M25562 Pain in left knee: Secondary | ICD-10-CM

## 2024-03-23 DIAGNOSIS — Z131 Encounter for screening for diabetes mellitus: Secondary | ICD-10-CM

## 2024-03-23 DIAGNOSIS — E559 Vitamin D deficiency, unspecified: Secondary | ICD-10-CM

## 2024-03-23 DIAGNOSIS — E785 Hyperlipidemia, unspecified: Secondary | ICD-10-CM

## 2024-03-23 DIAGNOSIS — G43109 Migraine with aura, not intractable, without status migrainosus: Secondary | ICD-10-CM | POA: Diagnosis not present

## 2024-03-23 DIAGNOSIS — Z8639 Personal history of other endocrine, nutritional and metabolic disease: Secondary | ICD-10-CM

## 2024-03-23 MED ORDER — DICLOFENAC SODIUM 1 % EX GEL
2.0000 g | Freq: Four times a day (QID) | CUTANEOUS | 0 refills | Status: AC
Start: 2024-03-23 — End: ?

## 2024-03-23 MED ORDER — ZAVZPRET 10 MG/ACT NA SOLN
1.0000 | Freq: Every day | NASAL | 1 refills | Status: DC | PRN
Start: 2024-03-23 — End: 2024-09-16

## 2024-03-23 MED ORDER — NURTEC 75 MG PO TBDP
1.0000 | ORAL_TABLET | ORAL | 2 refills | Status: AC
Start: 2024-03-23 — End: ?

## 2024-03-23 MED ORDER — CYANOCOBALAMIN 1000 MCG/ML IJ SOLN
1000.0000 ug | Freq: Once | INTRAMUSCULAR | Status: AC
  Administered 2024-03-23: 1000 ug via INTRAMUSCULAR

## 2024-03-23 NOTE — Progress Notes (Addendum)
 Name: Sally Mueller   MRN: [id number]    DOB: [birth year]   Date:03/23/2024       Progress Note  Subjective  Chief Complaint  Chief Complaint  Patient presents with   Migraine    Since friday   Knee Pain    L knee hurts more when standing but having to sit and bend it   Discussed the use of AI scribe software for clinical note transcription with the patient, who gave verbal consent to proceed.  History of Present Illness Sally Mueller is a 48 year old female with migraines and knee pain who presents with a persistent migraine and knee injury.  She has been experiencing a migraine that began on Friday and has persisted for four days. This migraine is atypical for her, as she usually experiences migraines around her menstrual cycle, lasting one to two days. She takes Ubrelvy  as needed for migraines and took her last dose this morning. She typically experiences one to three migraines per month, lasting about 1-2 days , average of 5 -6 migraine days per month  During migraines, she also experiences nausea and uses promethazine  for relief.  While experiencing the migraine, she injured her knee on Monday evening around 5 PM by stepping down incorrectly from a ladder. This is the same knee she injured about a year ago. The pain is located inside the knee and is exacerbated by movement, particularly when transitioning from sitting to standing or vice versa. She has not noticed any swelling. She has used a heating pad and a massager for relief, but the pain persists, making it difficult to bear weight on the knee.  Her past medical history includes a previous knee injury from stepping down from a chair end of 2023 , which resulted in bruising and pain that resolved over time. 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 She has tried Tylenol  and icing for previous knee pain, which provided some relief.  She has a history of vitamin D  and B12 deficiencies, for which  she takes supplements. She has not been consistent with her vitamin D  supplementation recently. She also has a history of gastric ulcer disease and takes omeprazole  daily. She reports recent nausea and burping with a taste of boiled eggs, which she associates with her migraine or stomach issues.  She is currently taking Prozac, gianvi, guanfacine, hydroxyzine , and lorazepam for her mental health conditions, including major depression and anxiety. She is in the process of changing jobs, which she believes may reduce her stress and potentially improve her migraines.  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   COLONOSCOPY WITH PROPOFOL  N/A  06/10/2022   Procedure: COLONOSCOPY WITH PROPOFOL ;  Surgeon: Selena Daily, MD;  Location: [facility];  Service: Endoscopy;  Laterality: N/A;   ESOPHAGOGASTRODUODENOSCOPY (EGD) WITH PROPOFOL  N/A 06/10/2022   Procedure: ESOPHAGOGASTRODUODENOSCOPY (EGD) WITH PROPOFOL  WITH BIOPSIES;  Surgeon: Selena Daily, MD;  Location: [facility];  Service: Endoscopy;  Laterality: N/A;   ESOPHAGOGASTRODUODENOSCOPY (EGD) WITH PROPOFOL  N/A 08/26/2022   Procedure: ESOPHAGOGASTRODUODENOSCOPY (EGD) WITH  PROPOFOL ;  Surgeon: Selena Daily, MD;  Location: [facility];  Service: Endoscopy;  Laterality: N/A;   LIPOMA EXCISION  05/01/2014   TUBAL LIGATION  2002   TUMOR REMOVAL  2012?    Family History  Problem Relation Age of Onset   Cardiomyopathy Mother    Hypertension Mother    Drug abuse Mother    Hyperlipidemia Father    Alcohol abuse Sister    Drug abuse Sister    Bipolar disorder Sister    Schizophrenia Sister    Cancer Maternal Aunt    Breast cancer Neg Hx     Social History   Tobacco Use   Smoking status: Former    Current packs/day: 0.00    Types: Cigarettes    Start date: 11/17/1993    Quit date: 11/17/2001    Years since quitting: 22.3   Smokeless tobacco: Never  Substance Use Topics   Alcohol use: No    Comment: socially     Current Outpatient Medications:    diclofenac Sodium (VOLTAREN) 1 % GEL, Apply 2 g topically 4 (four) times daily., Disp: 100 g, Rfl: 0   FLUoxetine (PROZAC) 40 MG capsule, Take 40 mg by mouth every morning., Disp: , Rfl:    guanFACINE (INTUNIV) 2 MG TB24 ER tablet, Take 2 mg by mouth daily., Disp: , Rfl:    hydrOXYzine  (ATARAX ) 10 MG tablet, Take 10 mg by mouth 2 (two) times daily as needed., Disp: , Rfl:    LORazepam (ATIVAN) 0.5 MG tablet, Take 0.5 mg by mouth daily as needed., Disp: , Rfl:    omeprazole  (PRILOSEC) 40 MG capsule, Take 1 capsule (40 mg total) by mouth daily., Disp: 30 capsule, Rfl: 0   progesterone (PROMETRIUM) 100 MG capsule, Take 100 mg by mouth daily. Third week of cycle, Disp: , Rfl:    promethazine  (PHENERGAN ) 25 MG tablet, Take 1 tablet (25 mg total) by mouth every 8 (eight) hours as needed., Disp: 30 tablet, Rfl: 0   Rimegepant Sulfate (NURTEC) 75 MG TBDP, Take 1 tablet (75 mg total) by mouth every other day., Disp: 16 tablet, Rfl: 2   valACYclovir  (VALTREX ) 500 MG tablet, Take 1 tablet (500 mg total) by mouth daily. And twice daily on outbreaks, Disp: 35 tablet, Rfl: 0   Zavegepant HCl (ZAVZPRET) 10  MG/ACT SOLN, Place 1 each into the nose daily as needed., Disp: 6 each, Rfl: 1   cholecalciferol (VITAMIN D3) 25 MCG (1000 UNIT) tablet, Take 1,000 Units by mouth daily. (Patient not taking: Reported on 03/23/2024), Disp: , Rfl:   Allergies  Allergen Reactions   Sumatriptan     crawling sensation in her body, flushed    I personally reviewed active problem list, medication list, allergies, family history with the patient/caregiver today.   ROS  Constitutional: Negative for fever or weight change.  Respiratory: Negative for cough and shortness of breath.   Cardiovascular: Negative for chest pain or palpitations.  Gastrointestinal: Negative for abdominal pain, no bowel changes.  Musculoskeletal: Negative for gait problem or joint swelling.  Skin: Negative  for rash.  Neurological: Negative for dizziness or headache.  No other specific complaints in a complete review of systems (except as listed in HPI above).   Objective Physical Exam  CONSTITUTIONAL: Patient appears well-developed and well-nourished. No distress. HEENT: Head atraumatic, normocephalic, neck supple. CARDIOVASCULAR: Normal rate, regular rhythm and normal heart sounds. No murmur heard. No BLE edema. PULMONARY: Effort normal and breath sounds normal. No respiratory distress. ABDOMINAL: There is no tenderness or distention. MUSCULOSKELETAL:Analgic gait Pain shifting positions, tender rolling nodule on medial aspect of left knee, no effusion or redness , pain worse when changing from flexing to extension and vice versa  PSYCHIATRIC: Patient has a normal mood and affect. Behavior is normal. Judgment and thought content normal. NEUROLOGICAL: Cranial nerves II-VII grossly intact. Balance normal. Romberg test negative.  Vitals:   03/23/24 1322 03/23/24 1425  BP: (!) 146/88 136/82  Pulse: 95   Resp: 16   SpO2: 98%   Weight: 243 lb (110.2 kg)   Height: 5\' 4"  (1.626 m)     Body mass index is 41.71 kg/m.  Recent Results  (from the past 2160 hours)  CBC with Differential/Platelet     Status: Abnormal   Collection Time: 01/21/24  8:14 AM  Result Value Ref Range   WBC 6.2 3.8 - 10.8 Thousand/uL   RBC 4.71 3.80 - 5.10 Million/uL   Hemoglobin 12.0 11.7 - 15.5 g/dL   HCT 91.4 78.2 - 95.6 %   MCV 82.0 80.0 - 100.0 fL   MCH 25.5 (L) 27.0 - 33.0 pg   MCHC 31.1 (L) 32.0 - 36.0 g/dL    Comment: For adults, a slight decrease in the calculated MCHC value (in the range of 30 to 32 g/dL) is most likely not clinically significant; however, it should be interpreted with caution in correlation with other red cell parameters and the patient's clinical condition.    RDW 13.2 11.0 - 15.0 %   Platelets 307 140 - 400 Thousand/uL   MPV 12.0 7.5 - 12.5 fL   Neutro Abs 4,321 1,500 - 7,800 cells/uL   Absolute Lymphocytes 1,370 850 - 3,900 cells/uL   Absolute Monocytes 378 200 - 950 cells/uL   Eosinophils Absolute 81 15 - 500 cells/uL   Basophils Absolute 50 0 - 200 cells/uL   Neutrophils Relative % 69.7 %   Total Lymphocyte 22.1 %   Monocytes Relative 6.1 %   Eosinophils Relative 1.3 %   Basophils Relative 0.8 %  COMPLETE METABOLIC PANEL WITH GFR     Status: Abnormal   Collection Time: 01/21/24  8:14 AM  Result Value Ref Range   Glucose, Bld 120 (H) 65 - 99 mg/dL    Comment: .            Fasting reference interval . For someone without known diabetes, a glucose value between 100 and 125 mg/dL is consistent with prediabetes and should be confirmed with a follow-up test. .    BUN 11 7 - 25 mg/dL   Creat 2.13 0.86 - 5.78 mg/dL   eGFR 469 > OR = 60 GE/XBM/8.41L2   BUN/Creatinine Ratio SEE NOTE: 6 - 22 (calc)    Comment:    Not Reported: BUN and Creatinine are within    reference range. .    Sodium 138 135 - 146 mmol/L   Potassium 3.8 3.5 - 5.3 mmol/L   Chloride 103 98 - 110 mmol/L   CO2 27 20 - 32 mmol/L   Calcium 9.1 8.6 - 10.2 mg/dL  Total Protein 6.4 6.1 - 8.1 g/dL   Albumin 3.9 3.6 - 5.1 g/dL    Globulin 2.5 1.9 - 3.7 g/dL (calc)   AG Ratio 1.6 1.0 - 2.5 (calc)   Total Bilirubin 1.1 0.2 - 1.2 mg/dL   Alkaline phosphatase (APISO) 75 31 - 125 U/L   AST 16 10 - 35 U/L   ALT 13 6 - 29 U/L  Lipase     Status: None   Collection Time: 01/21/24  8:14 AM  Result Value Ref Range   Lipase 29 7 - 60 U/L    PHQ2/9:    03/23/2024    1:23 PM 01/20/2024    3:33 PM 10/28/2023    3:10 PM 06/26/2023    3:21 PM 01/09/2023   11:02 AM  Depression screen PHQ 2/9  Decreased Interest 3 3 1  0 0  Down, Depressed, Hopeless 3 3 1 1  0  PHQ - 2 Score 6 6 2 1  0  Altered sleeping 3 3 3  0   Tired, decreased energy 3 3 1  0   Change in appetite 3 3 1  0   Feeling bad or failure about yourself  0 1 0 0   Trouble concentrating 3 1 1  0   Moving slowly or fidgety/restless 0 0 0 0   Suicidal thoughts 0 0 0 0   PHQ-9 Score 18 17 8 1    Difficult doing work/chores Somewhat difficult Very difficult Somewhat difficult      phq 9 is positive  Fall Risk:    03/23/2024    1:17 PM 10/28/2023    3:03 PM 06/26/2023    3:21 PM 01/09/2023   10:59 AM 12/26/2022    3:06 PM  Fall Risk   Falls in the past year? 0 0 0 1 1  Number falls in past yr: 0 0 0 1 1  Injury with Fall? 0 0 0 0 0  Risk for fall due to : No Fall Risks No Fall Risks No Fall Risks History of fall(s) History of fall(s)  Follow up Falls prevention discussed;Education provided;Falls evaluation completed Falls prevention discussed;Education provided;Falls evaluation completed Falls prevention discussed  Falls prevention discussed;Education provided;Falls evaluation completed      Assessment & Plan Morbid Obesity BMI 41, contributing to knee pain and complicating potential knee surgery. Weight loss recommended. - Encourage weight loss to reduce knee pain and improve overall health.  Major depression, recurrent, moderate Managed with Prozac. Job-related stress may exacerbate depression and anxiety. Transitioning to a new job may help reduce stress. -  Continue Prozac as prescribed by psychiatrist. - Monitor mental health status, especially during job transition.  Knee pain Recurrent pain likely related to previous injury, exacerbated by movement. History of instability in left knee. MRI previously ordered but not covered by insurance. Orthopedic evaluation recommended. - Refer to Dr. Lydia Sams  at [facility] for orthopedic evaluation. - Prescribe Voltaren gel for topical pain relief due to history of PUD  Migraine Atypical migraine lasting four days with nausea. Typically 1-2 migraines per month. Considering switch to Nurtec for prevention and Zavspret for acute relief. Stress may contribute to frequency. - Prescribe Nurtec for migraine prevention, to be taken every other day. - Prescribe Zev Sprep nasal spray for acute migraine relief. - Discontinue Ubrelvy . - Continue promethazine  for nausea as needed.  Gastric ulcer disease Managed with omeprazole . Recent nausea and burping possibly related to migraine or gastric issues. - Continue omeprazole  once daily. - Consider follow-up with gastroenterologist if symptoms persist.  Vitamin D  deficiency Not taking supplements, necessary for long-term management. - Prescribe vitamin D  2000 IU daily for lifelong supplementation.  Vitamin B12 deficiency Previously managed with injections and sublingual supplements. Current status unknown. - Check B12 levels and consider resuming injections or sublingual supplements as needed.  Dyslipidemia Requires monitoring to prevent cardiovascular complications. - Check lipid panel to assess current status.  Diabetes screening Due for screening to monitor potential development of diabetes. - Perform diabetes screening with A1c test.

## 2024-03-24 ENCOUNTER — Encounter: Payer: Self-pay | Admitting: Oncology

## 2024-03-24 ENCOUNTER — Telehealth: Payer: Self-pay | Admitting: Pharmacy Technician

## 2024-03-24 ENCOUNTER — Other Ambulatory Visit (HOSPITAL_COMMUNITY): Payer: Self-pay

## 2024-03-24 LAB — B12 AND FOLATE PANEL
Folate: 10.9 ng/mL
Vitamin B-12: 348 pg/mL (ref 200–1100)

## 2024-03-24 LAB — IRON,TIBC AND FERRITIN PANEL
%SAT: 7 % — ABNORMAL LOW (ref 16–45)
Ferritin: 5 ng/mL — ABNORMAL LOW (ref 16–232)
Iron: 29 ug/dL — ABNORMAL LOW (ref 40–190)
TIBC: 418 ug/dL (ref 250–450)

## 2024-03-24 LAB — LIPID PANEL
Cholesterol: 207 mg/dL — ABNORMAL HIGH (ref <200)
HDL: 40 mg/dL — ABNORMAL LOW (ref >=50)
LDL Cholesterol (Calc): 137 mg/dL — ABNORMAL HIGH
Non-HDL Cholesterol (Calc): 167 mg/dL — ABNORMAL HIGH (ref <130)
Total CHOL/HDL Ratio: 5.2 (calc) — ABNORMAL HIGH (ref <5.0)
Triglycerides: 165 mg/dL — ABNORMAL HIGH (ref <150)

## 2024-03-24 LAB — HEMOGLOBIN A1C
Hgb A1c MFr Bld: 5.6 % (ref <5.7)
Mean Plasma Glucose: 114 mg/dL
eAG (mmol/L): 6.3 mmol/L

## 2024-03-24 LAB — VITAMIN D 25 HYDROXY (VIT D DEFICIENCY, FRACTURES): Vit D, 25-Hydroxy: 19 ng/mL — ABNORMAL LOW (ref 30–100)

## 2024-03-24 NOTE — Telephone Encounter (Signed)
 Pharmacy Patient Advocate Encounter   Received notification from Onbase that prior authorization for Nurtec 75MG  dispersible tablets is required/requested.   Insurance verification completed.   The patient is insured through [employer] [facility] .   Per test claim: PA required; PA started via CoverMyMeds. KEY [id number] . Waiting for clinical questions to populate.

## 2024-03-24 NOTE — Telephone Encounter (Signed)
 Pharmacy Patient Advocate Encounter  Received notification from [employer] that Prior Authorization for Nurtec 75MG  dispersible tablets has been APPROVED from 03/24/24 to 03/24/25. Ran test claim, Copay is $0.00. This test claim was processed through [facility]- copay amounts may vary at other pharmacies due to pharmacy/plan contracts, or as the patient moves through the different stages of their insurance plan.   PA #/Case ID/Reference #: [id number]

## 2024-03-25 ENCOUNTER — Encounter: Payer: Self-pay | Admitting: Family Medicine

## 2024-03-28 ENCOUNTER — Telehealth: Payer: Self-pay | Admitting: Pharmacy Technician

## 2024-03-28 ENCOUNTER — Other Ambulatory Visit (HOSPITAL_COMMUNITY): Payer: Self-pay

## 2024-03-28 NOTE — Telephone Encounter (Signed)
 Pharmacy Patient Advocate Encounter   Received notification from Onbase that prior authorization for Zavzpret  10MG /ACT solution is required/requested.   Insurance verification completed.   The patient is insured through [employer] [facility] .   Per test claim: The current 6 day co-pay is, $0.00.  No PA needed at this time. This test claim was processed through [facility]- copay amounts may vary at other pharmacies due to pharmacy/plan contracts, or as the patient moves through the different stages of their insurance plan.

## 2024-04-01 ENCOUNTER — Encounter: Payer: Self-pay | Admitting: Family Medicine

## 2024-04-27 ENCOUNTER — Ambulatory Visit: Payer: Self-pay | Admitting: Family Medicine

## 2024-06-15 ENCOUNTER — Ambulatory Visit: Admitting: Family Medicine

## 2024-07-29 ENCOUNTER — Telehealth: Payer: Self-pay | Admitting: Family Medicine

## 2024-08-01 NOTE — Telephone Encounter (Signed)
 Copied from CRM #[id number]. Topic: Clinical - Medication Refill >> Jul 29, 2024  4:23 PM Fonda T wrote: Medication: UBRELVY  100 MG TABS   Has the patient contacted their pharmacy? Yes, advised to contact office, had no more refills    This is the patient's preferred pharmacy:  [id number] [place], [place] - [address] AT [facility] OF [address] [place] [address] [place] [address] Phone: [phone] Fax: [phone]   Is this the correct pharmacy for this prescription? Yes If no, delete pharmacy and type the correct one.   Has the prescription been filled recently? No  Is the patient out of the medication? Yes  Has the patient been seen for an appointment in the last year OR does the patient have an upcoming appointment? Yes, last visit 03/23/24  Can we respond through MyChart? Yes  Agent: Please be advised that Rx refills may take up to 3 business days. We ask that you follow-up with your pharmacy.

## 2024-08-01 NOTE — Telephone Encounter (Signed)
 Spoke to patient advised her Ubrelvy  was d/c due to in May PCP prescribed NURTEC and PA was done she will contact [facility] for to get rx picked up.

## 2024-09-15 ENCOUNTER — Encounter: Payer: Self-pay | Admitting: Family Medicine

## 2024-09-16 ENCOUNTER — Other Ambulatory Visit: Payer: Self-pay | Admitting: Family Medicine

## 2024-09-16 MED ORDER — UBRELVY 100 MG PO TABS: 1.0000 | ORAL_TABLET | Freq: Every day | ORAL | 2 refills | Status: AC | PRN

## 2024-12-22 ENCOUNTER — Encounter: Payer: Self-pay | Admitting: Family Medicine

## 2024-12-26 ENCOUNTER — Ambulatory Visit: Admitting: Family Medicine
